# Patient Record
Sex: Female | Born: 1995
Health system: Southern US, Community
[De-identification: ages and names within clinical notes are randomized; demographics above are authoritative.]

## PROBLEM LIST (undated history)

## (undated) DIAGNOSIS — F419 Anxiety disorder, unspecified: Secondary | ICD-10-CM

## (undated) DIAGNOSIS — F32A Depression, unspecified: Secondary | ICD-10-CM

## (undated) DIAGNOSIS — E282 Polycystic ovarian syndrome: Secondary | ICD-10-CM

## (undated) DIAGNOSIS — K760 Fatty (change of) liver, not elsewhere classified: Secondary | ICD-10-CM

## (undated) DIAGNOSIS — G43909 Migraine, unspecified, not intractable, without status migrainosus: Secondary | ICD-10-CM

## (undated) DIAGNOSIS — J45909 Unspecified asthma, uncomplicated: Secondary | ICD-10-CM

## (undated) HISTORY — DX: Migraine, unspecified, not intractable, without status migrainosus: G43.909

## (undated) HISTORY — DX: Fatty (change of) liver, not elsewhere classified: K76.0

## (undated) HISTORY — DX: Unspecified asthma, uncomplicated: J45.909

## (undated) HISTORY — DX: Depression, unspecified: F32.A

## (undated) HISTORY — PX: WISDOM TOOTH EXTRACTION: SHX21

---
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
## 2013-08-20 ENCOUNTER — Ambulatory Visit: Payer: Self-pay | Attending: Internal Medicine

## 2013-09-15 ENCOUNTER — Ambulatory Visit: Payer: Self-pay | Admitting: Internal Medicine

## 2013-11-30 ENCOUNTER — Encounter: Payer: Self-pay | Admitting: Medical

## 2013-11-30 ENCOUNTER — Ambulatory Visit (INDEPENDENT_AMBULATORY_CARE_PROVIDER_SITE_OTHER): Payer: Medicaid Other | Admitting: Medical

## 2013-11-30 VITALS — BP 138/77 | HR 82 | Ht 65.0 in | Wt 295.9 lb

## 2013-11-30 DIAGNOSIS — N926 Irregular menstruation, unspecified: Secondary | ICD-10-CM

## 2013-11-30 DIAGNOSIS — Z832 Family history of diseases of the blood and blood-forming organs and certain disorders involving the immune mechanism: Secondary | ICD-10-CM

## 2013-11-30 NOTE — Progress Notes (Signed)
Patient reports abnormal periods. She had her last period on 2/26 and bled for 2-3 days. Prior to that her last period was in August.  She states that she can't take birth control because her mother told her that her family has an increased risk of clots.

## 2013-11-30 NOTE — Patient Instructions (Signed)

## 2013-11-30 NOTE — Progress Notes (Signed)
Patient ID: Chloe Pena, female   DOB: 1996/05/24, 18 y.o.   MRN: [id number]  Chief Complaint  Patient presents with  . Amenorrhea  . Dysmenorrhea    HPI Chloe Pena is a 18 y.o.  [id number]  female presenting for evaluation of irregular menses. Pt states that since menarche at the age of 18, she has been experiencing irregular menstrual cycles. These cycles are infrequent in their timings, sometimes absent for as much as 3 months at a time, and sometimes lasting up until 7-8 days at a time. The amount of bleeding ranges from general spotting to heavier bleeding. In addition to the menstrual irregularities, the pt has had some cramping which occurs monthly, and is present with or without bleeding. She has never been evaluated for this in the past. Pt denies any known medical conditions outside of asthma, however does not see a PCP with any regularity.  Pt denies any sexual history or STI history. Pt has a family history significant for hypothyroidism (brother) and diabetes (maternal mother/father). The patient's mother reports a history of 4 DVTs and concern for other female family members with 'clotting' issues. For this reason the patient and her mother are adamantly opposed to OCPs.   Past Medical History  Diagnosis Date  . Asthma     as a child    History reviewed. No pertinent past surgical history.  Family History  Problem Relation Age of Onset  . Migraines Mother   . GER disease Mother   . Heart disease Mother   . Cancer Maternal Grandmother   . Heart failure Mother   . Hypertension Maternal Grandmother   . Diabetes Maternal Grandmother   . Diabetes Father   . Thyroid disease Brother     Social History History  Substance Use Topics  . Smoking status: Never Smoker   . Smokeless tobacco: Never Used  . Alcohol Use: No    No Known Allergies  No current outpatient prescriptions on file.   No current facility-administered medications for this visit.    Review  of Systems Review of Systems Review of Systems - History obtained from the patient Cardiovascular ROS: no chest pain or dyspnea on exertion Gastrointestinal ROS: no abdominal pain, change in bowel habits, or black or bloody stools Genito-Urinary ROS: no dysuria, trouble voiding, or hematuria Musculoskeletal ROS: negative Reproductive: irregular bleeding and menstrual cramping  Blood pressure 138/77, pulse 82, height 5\' 5"  (1.651 m), weight 134.219 kg (295 lb 14.4 oz), last menstrual period 11/12/2013.  Physical Exam Physical Exam Physical Examination:  GENERAL ASSESSMENT: well developed and well nourished, obese, hirsutism noted HEENT: Normocephalic, atraumatic.   CHEST: normal air exchange, no rales, no rhonchi, no wheezes, respiratory effort normal with no retractions HEART: regular rate and rhythm, normal S1/S2, no murmurs SKIN: Warm, dry and without erythema PSYCH: Normal mood and affect  Assessment    Irregular menses    Plan    1. TSH, FSH, LH and testosterone drawn today 2. Patient referred to [place] for primary care 3. Patient will be contacted with any abnormal results 4. Patient to return to [facility] clinic PRN       Jeanett Schlein T 11/30/2013, 4:24 PM   I have seen and evaluated the patient with the PA student. I agree with the assessment and plan as written above.   Farris Has, PA-C 11/30/2013 4:50 PM

## 2013-12-01 LAB — LUTEINIZING HORMONE: LH: 9 m[IU]/mL

## 2013-12-01 LAB — FOLLICLE STIMULATING HORMONE: FSH: 4.4 m[IU]/mL

## 2013-12-01 LAB — TESTOSTERONE, FREE, TOTAL, SHBG
Sex Hormone Binding: 16 nmol/L — ABNORMAL LOW (ref 18–114)
Testosterone, Free: 18.4 pg/mL — ABNORMAL HIGH (ref 0.6–6.8)
Testosterone-% Free: 2.6 % — ABNORMAL HIGH (ref 0.4–2.4)
Testosterone: 71 ng/dL — ABNORMAL HIGH (ref 15–40)

## 2013-12-01 LAB — TSH: TSH: 1.611 u[IU]/mL (ref 0.350–4.500)

## 2013-12-04 ENCOUNTER — Telehealth: Payer: Self-pay

## 2013-12-04 NOTE — Telephone Encounter (Signed)
Called pt and her mother picked up the phone and stated that she was in class and does not get out until 1pm.  I advised her mother to please have her call the office on Monday before 4pm.  Pt's mother agreed.

## 2013-12-04 NOTE — Telephone Encounter (Signed)
Message copied by Michel Harrow on Fri Dec 04, 2013 11:15 AM ------      Message from: Farris Has      Created: Fri Dec 04, 2013  9:25 AM       Please call patient and inform her that labs indicate that her irregular periods are most likely because of PCOS. TSH was normal. We discussed PCOS at her last visit and how the only way to regulate periods would be OCPs. There is no treatment needed since she did not want to take OCPs but it is likely her irregular periods will continue. If the patient has further questions or concerns she can make an appointment to come back or I can call her to discuss just let me know. Thanks! Almyra Free ------

## 2013-12-07 NOTE — Telephone Encounter (Signed)
Called patient, no answer- left message that we are trying to reach you with some information, please call us back at the clinics

## 2013-12-08 NOTE — Telephone Encounter (Signed)
Pt called back and I spoke with her about her test results. I informed her of the normal TSH value and that her periods are most likely due to PCOS as previously discussed at her appt with Rozelle Logan, PA.  If she decides that she would like to try OCP's to regulaet her periods or if she has additional questions or concerns, she may schedule a clinic appt for follow up. Pt then asked if she could have a copy of her results mailed to her. I explained that we cannot do that without a signed Release of Information form. She is welcome to come to the clinic and sign the release so that we can provide a copy of her results.  Pt voiced understanding.

## 2014-01-26 ENCOUNTER — Ambulatory Visit: Payer: Self-pay | Admitting: Internal Medicine

## 2014-02-03 ENCOUNTER — Encounter: Payer: Self-pay | Admitting: Internal Medicine

## 2014-02-03 ENCOUNTER — Ambulatory Visit: Payer: Medicaid Other | Attending: Internal Medicine | Admitting: Internal Medicine

## 2014-02-03 VITALS — BP 118/72 | HR 74 | Temp 98.1°F | Resp 22 | Ht 65.0 in | Wt 295.8 lb

## 2014-02-03 DIAGNOSIS — Z0000 Encounter for general adult medical examination without abnormal findings: Secondary | ICD-10-CM | POA: Diagnosis not present

## 2014-02-03 DIAGNOSIS — Z833 Family history of diabetes mellitus: Secondary | ICD-10-CM | POA: Insufficient documentation

## 2014-02-03 DIAGNOSIS — R51 Headache: Secondary | ICD-10-CM | POA: Diagnosis not present

## 2014-02-03 DIAGNOSIS — R519 Headache, unspecified: Secondary | ICD-10-CM

## 2014-02-03 LAB — GLUCOSE, POCT (MANUAL RESULT ENTRY): POC Glucose: 102 mg/dl — AB (ref 70–99)

## 2014-02-03 LAB — POCT GLYCOSYLATED HEMOGLOBIN (HGB A1C): Hemoglobin A1C: 5

## 2014-02-03 NOTE — Progress Notes (Signed)
Patient ID: Chloe Pena, female   DOB: 23-Mar-1996, 18 y.o.   MRN: [id number]   [id number]:[id number]  [id number]  DOB - [birth year]  CC:  Chief Complaint  Patient presents with  . Establish Care  . Annual Exam       HPI: Chloe Pena is a 18 y.o. female here today to establish medical care.  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  Patient presents with her mother who feels that she can give a more accurate description of the presenting problem.  Patients mother reports a family history of migraine headaches which she sees a Garment/textile technologist for and would like her daughter to see the same one.  Patient reports that she is having headaches about five times per month that start at the crown of her head and radiate to the frontal lobe.  She reports it is a throbbing sensation.  Headaches are triggered by smells, stress, and being in the sun for a prolonged period of time.  She reports that laying down in a dark room makes the headaches better.  Patient reports minimal nausea and some photophobia. Denies aura.   No Known Allergies Past Medical History  Diagnosis Date  . Asthma     as a child   No current outpatient prescriptions on file prior to visit.   No current facility-administered medications on file prior to visit.   Family History  Problem Relation Age of Onset  . Migraines Mother   . GER disease Mother   . Heart disease Mother   . Cancer Maternal Grandmother   . Heart failure Mother   . Hypertension Maternal Grandmother   . Diabetes Maternal Grandmother   . Diabetes Father   . Thyroid disease Brother    History   Social History  . Marital Status: Single    Spouse Name: N/A    Number of Children: N/A  . Years of Education: N/A   Occupational History  . Not on file.   Social History Main Topics  . Smoking status: Never Smoker   . Smokeless tobacco: Never Used  . Alcohol Use: No  . Drug Use: No  . Sexual Activity: No   Other Topics Concern   . Not on file   Social History Narrative  . No narrative on file   Review of Systems  Constitutional: Negative for fever and chills.  Eyes: Positive for photophobia.  Respiratory: Negative.   Cardiovascular: Negative.   Gastrointestinal: Positive for nausea (rare). Negative for vomiting and abdominal pain.  Genitourinary: Negative.   Musculoskeletal: Negative.   Skin: Negative.   Neurological: Positive for headaches. Negative for dizziness, tingling, tremors, sensory change, speech change, focal weakness, seizures and loss of consciousness.  Endo/Heme/Allergies: Negative.   Psychiatric/Behavioral: Negative.       Objective:   Filed Vitals:   02/03/14 1546  BP: 118/72  Pulse: 74  Temp: 98.1 F (36.7 C)  Resp: 22    Physical Exam: Constitutional: Patient appears well-developed and well-nourished. No distress. HENT: Normocephalic, atraumatic, External right and left ear normal. Oropharynx is clear and moist.  Eyes: Conjunctivae and EOM are normal. PERRLA, no scleral icterus. Neck: Normal ROM. Neck supple. No JVD. No tracheal deviation. No thyromegaly. CVS: RRR, S1/S2 +, no murmurs, no gallops, no carotid bruit.  Pulmonary: Effort and breath sounds normal, no stridor, rhonchi, wheezes, rales.  Abdominal: Soft. BS +, no distension, tenderness, rebound or guarding.  Musculoskeletal: Normal range of motion. No edema and no tenderness.  Lymphadenopathy: No lymphadenopathy noted, cervical Neuro: Alert. Normal reflexes, muscle tone coordination. No cranial nerve deficit. Skin: Skin is warm and dry. No rash noted. Not diaphoretic. No erythema. No pallor. Psychiatric: Normal mood and affect. Behavior, judgment, thought content normal.  Lab Results  Component Value Date   WBC 7.8 03/09/2009   HGB 13.7 03/09/2009   HCT 41.2 03/09/2009   MCV 92.7 03/09/2009   PLT 215 03/09/2009   Lab Results  Component Value Date   CREATININE 0.79 03/09/2009   BUN 10 03/09/2009   NA 138  03/09/2009   K 4.1 03/09/2009   CL 107 03/09/2009   CO2 22 03/09/2009    Lab Results  Component Value Date   HGBA1C 5.0 02/03/2014   Lipid Panel  No results found for this basename: chol, trig, hdl, cholhdl, vldl, ldlcalc       Assessment and plan:   Chloe Pena was seen today for establish care and annual exam.  Diagnoses and associated orders for this visit:  Frequent headaches - Ambulatory referral to Neurology  Family history of diabetes mellitus - Glucose (CBG) - HgB A1c  Preventative health care - Ambulatory referral to Ophthalmology - Ambulatory referral to Dentistry - Lipid panel; Future - CBC; Future - COMPLETE METABOLIC PANEL WITH GFR; Future - TSH; Future - Vitamin D, 25-hydroxy; Future     Return if symptoms worsen or fail to improve.   Chari Manning, NP-C [facility] [phone] 02/03/2014, 9:59 PM

## 2014-02-03 NOTE — Progress Notes (Signed)
Patient here to establish care. Patient's mother present. States family history of DM and would like to be checked. States 6 year history of migraines.

## 2014-02-03 NOTE — Patient Instructions (Signed)

## 2014-02-05 ENCOUNTER — Ambulatory Visit (INDEPENDENT_AMBULATORY_CARE_PROVIDER_SITE_OTHER): Payer: Medicaid Other | Admitting: Neurology

## 2014-02-05 ENCOUNTER — Encounter: Payer: Self-pay | Admitting: Neurology

## 2014-02-05 VITALS — BP 120/78 | HR 68 | Resp 16 | Ht 65.0 in | Wt 297.0 lb

## 2014-02-05 DIAGNOSIS — R51 Headache: Secondary | ICD-10-CM

## 2014-02-05 DIAGNOSIS — R519 Headache, unspecified: Secondary | ICD-10-CM | POA: Insufficient documentation

## 2014-02-05 MED ORDER — TOPIRAMATE 25 MG PO TABS: ORAL_TABLET | ORAL | Status: DC

## 2014-02-05 MED ORDER — RIZATRIPTAN BENZOATE 10 MG PO TBDP: ORAL_TABLET | ORAL | Status: DC

## 2014-02-05 NOTE — Patient Instructions (Addendum)
1. MRI brain without contrast 2. Start Topamax 25mg  at bedtime 3. Take Maxalt 10mg  at onset of headache, may take second dose after 2 hours. Do not take more than 2 a week. 4. Keep headache diary, exercise and sleep hygiene is important

## 2014-02-05 NOTE — Progress Notes (Signed)
NEUROLOGY CONSULTATION NOTE  Chloe Pena MRN: [id number] DOB: 10-14-1995  Referring provider: Chari Manning, NP Primary care provider: Dr. Lorayne Marek  Reason for consult:  headaches  Thank you for your kind referral of [facility] for consultation of the above symptoms. Although her history is well known to you, please allow me to reiterate it for the purpose of our medical record. The patient was accompanied to the clinic by her mothe who also provides collateral information. Records and images were personally reviewed where available.  HISTORY OF PRESENT ILLNESS: This is a pleasant 18 year old left-handed woman with a history of obesity presenting for frequent headaches since age 75.  There is a strong family history of migraines, her mother is known to me for similar symptoms.  She reports that headaches usually start in the vertex then radiates diffusely, with a throbbing sensation and associated black spots in her vision, photo and phonophobia, and dizziness.  No nausea, vomiting, focal numbness/tingling/weakness.  Headaches can be mild where it is only "irritating" and does not hurt as bad, resolving if she eats or drinks, but can go up to a 10/10 where she needs to lie in a dark room with headache lasting up to 3 days.  She reports having several headaches this month, none so far this week.  She has tried Ibuprofen, Tylenol, and Excedrin with minimal effect.  Her mother gave her Maxalt which was effective. She and her mother report that their triggers are similar, including bright lights, chemical smells, heat, and stress in school.  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  She usually sleeps 6 hours at night and feels "like a zombie" in the morning, but denies daytime drowsiness once she is fully awake. Her mother reports loud snoring but no clear apneic episodes.    She denies any diplopia, dysarthria, dysphagia, focal numbness/tingling/weakness, bowel/bladder dysfunction.  She has some upper back pain.  There is a strong family history of migraines in her mother and "several generations" on her mother's side.  She does not know much of her father's medical history.    PAST MEDICAL HISTORY: Past Medical History  Diagnosis Date  . Asthma     as a child  . Migraine     PAST SURGICAL HISTORY: History reviewed. No pertinent past surgical history.  MEDICATIONS: No current outpatient prescriptions on file prior to visit.   No current facility-administered medications on file prior to visit.    ALLERGIES: No Known Allergies  FAMILY HISTORY: Family History  Problem Relation Age of Onset  . Migraines Mother   . GER disease Mother   . Heart disease Mother   . Cancer Maternal Grandmother   . Heart failure Mother   . Hypertension Maternal Grandmother   . Diabetes Maternal Grandmother   . Diabetes Father   . Thyroid disease Brother     SOCIAL HISTORY: History   Social History  . Marital Status: Single    Spouse Name: N/A    Number of Children: N/A  . Years of Education: N/A   Occupational History  . Not on file.   Social History Main Topics  . Smoking status: Never Smoker   . Smokeless tobacco: Never Used  . Alcohol Use: No  . Drug Use: No  . Sexual Activity: No   Other Topics Concern  . Not on file   Social History Narrative  . No narrative on file    REVIEW OF SYSTEMS: Constitutional: No fevers, chills, or sweats,  no generalized fatigue, change in appetite Eyes: No visual changes, double vision, eye pain Ear, nose and throat: No hearing loss, ear pain, nasal congestion, sore throat Cardiovascular: No chest pain, palpitations Respiratory:  No shortness of breath at rest or with exertion, wheezes GastrointestinaI: No nausea, vomiting, diarrhea, abdominal pain, fecal incontinence Genitourinary:  No dysuria, urinary retention or frequency Musculoskeletal:  + neck pain, back pain Integumentary: No rash, pruritus, skin  lesions Neurological: as above Psychiatric: No depression, insomnia, anxiety Endocrine: No palpitations, fatigue, diaphoresis, mood swings, change in appetite, change in weight, increased thirst Hematologic/Lymphatic:  No anemia, purpura, petechiae. Allergic/Immunologic: no itchy/runny eyes, nasal congestion, recent allergic reactions, rashes  PHYSICAL EXAM: Filed Vitals:   02/05/14 0819  BP: 120/78  Pulse: 68  Resp: 16   General: No acute distress, obese Head:  Normocephalic/atraumatic Eyes: Fundoscopic exam shows bilateral sharp discs, no vessel changes, exudates, or hemorrhages Neck: supple, no paraspinal tenderness, full range of motion Back: No paraspinal tenderness Heart: regular rate and rhythm Lungs: Clear to auscultation bilaterally. Vascular: No carotid bruits. Skin/Extremities: No rash, no edema Neurological Exam: Mental status: alert and oriented to person, place, and time, no dysarthria or aphasia, Fund of knowledge is appropriate.  Recent and remote memory are intact.  Attention and concentration are normal.    Able to name objects and repeat phrases. Cranial nerves: CN I: not tested CN II: pupils equal, round and reactive to light, visual fields intact, fundi unremarkable. CN III, IV, VI:  full range of motion, no nystagmus, no ptosis CN V: facial sensation intact CN VII: upper and lower face symmetric CN VIII: hearing intact to finger rub CN IX, X: gag intact, uvula midline CN XI: sternocleidomastoid and trapezius muscles intact CN XII: tongue midline Bulk & Tone: normal, no fasciculations. Motor: 5/5 throughout with no pronator drift. Sensation: intact to light touch, cold, pin, vibration and joint position sense.  No extinction to double simultaneous stimulation.  Romberg test negative Deep Tendon Reflexes: +2 throughout, no ankle clonus Plantar responses: downgoing bilaterally Cerebellar: no incoordination on finger to nose, heel to shin. No  dysdiadochokinesia Gait: narrow-based and steady, able to tandem walk adequately. Tremor: none  IMPRESSION: This is a 18 year old left-handed woman with a history of obesity presenting with frequent headaches.  Her exam is non-focal, no evidence of papilledema. Symptoms are suggestive of migraine headaches, however since she has not had any imaging in the past, an MRI brain without contrast will be ordered to rule out structural abnormality.  She will benefit from daily headache prophylactic medication, and they are interested in Topamax which her mother is also taking.  She will start low dose 25mg  qhs with plans for uptitration if needed. Side effects were discussed.  She will keep a headache calendar. She can use Maxalt as needed at onset of headache, and was instructed to take only 2-3 times a week to avoid rebound headaches.  We also discussed the importance of exercise and sleep hygiene with headaches.  She will follow-up in 4 months.  Thank you for allowing me to participate in the care of this patient. Please do not hesitate to call for any questions or concerns.   Ellouise Newer, M.D.

## 2014-02-09 ENCOUNTER — Telehealth: Payer: Self-pay | Admitting: Neurology

## 2014-02-09 NOTE — Telephone Encounter (Signed)
Ok to change, thanks 

## 2014-02-09 NOTE — Telephone Encounter (Signed)
Pharmacy notified.

## 2014-02-09 NOTE — Telephone Encounter (Signed)
Please call Cassie w/ [facility] # [phone]. Re: the  Pharmacy does not have oral disintegrating Maxalt tabs available but do have the regular in stock. Is it OK to change? / Sherri S.

## 2014-02-09 NOTE — Telephone Encounter (Signed)
Please advise 

## 2014-02-10 ENCOUNTER — Other Ambulatory Visit: Payer: Self-pay | Admitting: Internal Medicine

## 2014-02-10 ENCOUNTER — Ambulatory Visit: Payer: Medicaid Other | Attending: Internal Medicine

## 2014-02-10 ENCOUNTER — Telehealth: Payer: Self-pay

## 2014-02-10 DIAGNOSIS — R0683 Snoring: Secondary | ICD-10-CM

## 2014-02-10 DIAGNOSIS — Z0000 Encounter for general adult medical examination without abnormal findings: Secondary | ICD-10-CM

## 2014-02-10 LAB — CBC
HCT: 39.9 % (ref 36.0–46.0)
Hemoglobin: 13.7 g/dL (ref 12.0–15.0)
MCH: 30.4 pg (ref 26.0–34.0)
MCHC: 34.3 g/dL (ref 30.0–36.0)
MCV: 88.5 fL (ref 78.0–100.0)
Platelets: 266 10*3/uL (ref 150–400)
RBC: 4.51 MIL/uL (ref 3.87–5.11)
RDW: 13.3 % (ref 11.5–15.5)
WBC: 7.5 10*3/uL (ref 4.0–10.5)

## 2014-02-10 LAB — TSH: TSH: 1.951 u[IU]/mL (ref 0.350–4.500)

## 2014-02-10 LAB — LIPID PANEL
Cholesterol: 227 mg/dL — ABNORMAL HIGH (ref 0–200)
HDL: 33 mg/dL — ABNORMAL LOW (ref >39)
LDL Cholesterol: 155 mg/dL — ABNORMAL HIGH (ref 0–99)
Total CHOL/HDL Ratio: 6.9 Ratio
Triglycerides: 197 mg/dL — ABNORMAL HIGH (ref <150)
VLDL: 39 mg/dL (ref 0–40)

## 2014-02-10 LAB — COMPLETE METABOLIC PANEL WITH GFR
ALT: 67 U/L — ABNORMAL HIGH (ref 0–35)
AST: 35 U/L (ref 0–37)
Albumin: 4.5 g/dL (ref 3.5–5.2)
Alkaline Phosphatase: 96 U/L (ref 39–117)
BUN: 14 mg/dL (ref 6–23)
CO2: 26 mEq/L (ref 19–32)
Calcium: 9.4 mg/dL (ref 8.4–10.5)
Chloride: 105 mEq/L (ref 96–112)
Creat: 0.8 mg/dL (ref 0.50–1.10)
GFR, Est African American: >89 mL/min
GFR, Est Non African American: >89 mL/min
Glucose, Bld: 92 mg/dL (ref 70–99)
Potassium: 4.7 mEq/L (ref 3.5–5.3)
Sodium: 139 mEq/L (ref 135–145)
Total Bilirubin: 1.2 mg/dL — ABNORMAL HIGH (ref 0.2–1.1)
Total Protein: 6.8 g/dL (ref 6.0–8.3)

## 2014-02-10 NOTE — Telephone Encounter (Signed)
Patient is requesting a sleep study Is this something we can order for her?

## 2014-02-10 NOTE — Telephone Encounter (Signed)
Let patient know the order has been placed. Thanks

## 2014-02-11 ENCOUNTER — Telehealth: Payer: Self-pay

## 2014-02-11 LAB — VITAMIN D 25 HYDROXY (VIT D DEFICIENCY, FRACTURES): Vit D, 25-Hydroxy: 29 ng/mL — ABNORMAL LOW (ref 30–89)

## 2014-02-11 NOTE — Telephone Encounter (Signed)
Patient is aware the order for sleep study has been placed Telephone number for [facility] given to her as well

## 2014-02-12 ENCOUNTER — Telehealth: Payer: Self-pay | Admitting: *Deleted

## 2014-02-12 NOTE — Telephone Encounter (Signed)
Message copied by Joan Mayans on Fri Feb 12, 2014  3:00 PM ------      Message from: Chari Manning A      Created: Thu Feb 11, 2014  9:15 PM       Please counsel patient extensively about weight loss and exercise. Educate patient on low fat diet, avoid excess starches such as bread, pasta, potatoes to help lower cholesterol. She can eat more almonds and baked or broiled fish. Let her know to exercise at least 5 days a week and we will repeat lipid panel in 6 months, if no improvement she may need medication management. She may get low dose OTC vitamin D and take daily. ------

## 2014-02-12 NOTE — Telephone Encounter (Signed)
Left message

## 2014-02-18 ENCOUNTER — Ambulatory Visit (HOSPITAL_COMMUNITY): Admission: RE | Admit: 2014-02-18 | Payer: Medicaid Other | Source: Ambulatory Visit

## 2014-03-25 ENCOUNTER — Ambulatory Visit (HOSPITAL_BASED_OUTPATIENT_CLINIC_OR_DEPARTMENT_OTHER): Payer: Medicaid Other | Attending: Internal Medicine

## 2014-05-06 ENCOUNTER — Ambulatory Visit: Payer: Medicaid Other

## 2014-09-01 ENCOUNTER — Ambulatory Visit: Payer: Medicaid Other | Admitting: Neurology

## 2014-10-05 ENCOUNTER — Ambulatory Visit: Payer: Medicaid Other | Admitting: Neurology

## 2014-10-06 ENCOUNTER — Telehealth: Payer: Self-pay | Admitting: Neurology

## 2014-10-06 NOTE — Telephone Encounter (Signed)
Pt no showed 10/05/14 follow up appt w/ Dr. Delice Lesch. Pt verbally confirmed appt during reminder calls. No show letter w/ no show policy mailed to pt / Sherri S.

## 2017-12-08 ENCOUNTER — Inpatient Hospital Stay (HOSPITAL_COMMUNITY): Payer: BLUE CROSS/BLUE SHIELD

## 2017-12-08 ENCOUNTER — Inpatient Hospital Stay (HOSPITAL_COMMUNITY)
Admission: AD | Admit: 2017-12-08 | Discharge: 2017-12-09 | Disposition: A | Discharge status: Home / Self Care | Payer: BLUE CROSS/BLUE SHIELD | Source: Ambulatory Visit | Attending: Obstetrics and Gynecology | Admitting: Obstetrics and Gynecology

## 2017-12-08 ENCOUNTER — Encounter (HOSPITAL_COMMUNITY): Payer: Self-pay

## 2017-12-08 DIAGNOSIS — R109 Unspecified abdominal pain: Secondary | ICD-10-CM

## 2017-12-08 DIAGNOSIS — G8929 Other chronic pain: Secondary | ICD-10-CM | POA: Diagnosis not present

## 2017-12-08 DIAGNOSIS — N83201 Unspecified ovarian cyst, right side: Secondary | ICD-10-CM | POA: Diagnosis not present

## 2017-12-08 DIAGNOSIS — E282 Polycystic ovarian syndrome: Secondary | ICD-10-CM

## 2017-12-08 DIAGNOSIS — J45909 Unspecified asthma, uncomplicated: Secondary | ICD-10-CM | POA: Insufficient documentation

## 2017-12-08 DIAGNOSIS — R1030 Lower abdominal pain, unspecified: Secondary | ICD-10-CM

## 2017-12-08 DIAGNOSIS — Z6841 Body Mass Index (BMI) 40.0 and over, adult: Secondary | ICD-10-CM

## 2017-12-08 DIAGNOSIS — N83202 Unspecified ovarian cyst, left side: Secondary | ICD-10-CM | POA: Diagnosis not present

## 2017-12-08 DIAGNOSIS — G43909 Migraine, unspecified, not intractable, without status migrainosus: Secondary | ICD-10-CM | POA: Insufficient documentation

## 2017-12-08 DIAGNOSIS — R748 Abnormal levels of other serum enzymes: Secondary | ICD-10-CM | POA: Insufficient documentation

## 2017-12-08 DIAGNOSIS — M545 Low back pain: Secondary | ICD-10-CM | POA: Diagnosis not present

## 2017-12-08 LAB — COMPREHENSIVE METABOLIC PANEL
ALT: 170 U/L — ABNORMAL HIGH (ref 14–54)
AST: 87 U/L — ABNORMAL HIGH (ref 15–41)
Albumin: 4.3 g/dL (ref 3.5–5.0)
Alkaline Phosphatase: 121 U/L (ref 38–126)
Anion gap: 12 (ref 5–15)
BUN: 11 mg/dL (ref 6–20)
CO2: 24 mmol/L (ref 22–32)
Calcium: 9.4 mg/dL (ref 8.9–10.3)
Chloride: 99 mmol/L — ABNORMAL LOW (ref 101–111)
Creatinine, Ser: 0.7 mg/dL (ref 0.44–1.00)
GFR calc Af Amer: >60 mL/min (ref >60)
GFR calc non Af Amer: >60 mL/min (ref >60)
Glucose, Bld: 113 mg/dL — ABNORMAL HIGH (ref 65–99)
Potassium: 4.4 mmol/L (ref 3.5–5.1)
Sodium: 135 mmol/L (ref 135–145)
Total Bilirubin: 1.1 mg/dL (ref 0.3–1.2)
Total Protein: 8.1 g/dL (ref 6.5–8.1)

## 2017-12-08 LAB — CBC
HCT: 41.8 % (ref 36.0–46.0)
Hemoglobin: 14.1 g/dL (ref 12.0–15.0)
MCH: 31.2 pg (ref 26.0–34.0)
MCHC: 33.7 g/dL (ref 30.0–36.0)
MCV: 92.5 fL (ref 78.0–100.0)
Platelets: 277 10*3/uL (ref 150–400)
RBC: 4.52 MIL/uL (ref 3.87–5.11)
RDW: 12.9 % (ref 11.5–15.5)
WBC: 16.2 10*3/uL — ABNORMAL HIGH (ref 4.0–10.5)

## 2017-12-08 LAB — URINALYSIS, ROUTINE W REFLEX MICROSCOPIC
Bilirubin Urine: NEGATIVE
Glucose, UA: NEGATIVE mg/dL
Hgb urine dipstick: NEGATIVE
Ketones, ur: 5 mg/dL — AB
Nitrite: NEGATIVE
Protein, ur: NEGATIVE mg/dL
Specific Gravity, Urine: 1.027 (ref 1.005–1.030)
pH: 7 (ref 5.0–8.0)

## 2017-12-08 LAB — POCT PREGNANCY, URINE: Preg Test, Ur: NEGATIVE

## 2017-12-08 MED ORDER — IOPAMIDOL (ISOVUE-300) INJECTION 61%
100.0000 mL | Freq: Once | INTRAVENOUS | Status: AC | PRN
  Administered 2017-12-08: 100 mL via INTRAVENOUS

## 2017-12-08 MED ORDER — KETOROLAC TROMETHAMINE 60 MG/2ML IM SOLN
60.0000 mg | Freq: Once | INTRAMUSCULAR | Status: AC
Start: 2017-12-08 — End: 2017-12-08
  Administered 2017-12-08: 60 mg via INTRAMUSCULAR
  Filled 2017-12-08: qty 2

## 2017-12-08 NOTE — MAU Provider Note (Addendum)
History     CSN: [id number]  Arrival date and time: 12/08/17 1628   First Provider Initiated Contact with Patient 12/08/17 1705      Chief Complaint  Patient presents with  . Abdominal Pain  . Back Pain   HPI Chloe Pena 22 y.o. Comes to [facility]U with low back pain, abdominal pain and vomiting today.  [facility] best [facility] to come.  She took one Tylenol tablet earlier today but it did not help her pain.  Reports she has never had sex.  Previously she has been diagnosed with PCOS in 2015.  She reports no vaginal bleeding in 2 years.   OB History    Gravida  0   Para  0   Term  0   Preterm  0   AB  0   Living  0     SAB  0   TAB  0   Ectopic  0   Multiple  0   Live Births              Past Medical History:  Diagnosis Date  . Asthma    as a child  . Migraine     History reviewed. No pertinent surgical history.  Family History  Problem Relation Age of Onset  . Migraines Mother   . GER disease Mother   . Heart disease Mother   . Heart failure Mother   . Cancer Maternal Grandmother   . Hypertension Maternal Grandmother   . Diabetes Maternal Grandmother   . Diabetes Father   . Thyroid disease Brother     Social History   Tobacco Use  . Smoking status: Never Smoker  . Smokeless tobacco: Never Used  Substance Use Topics  . Alcohol use: No  . Drug use: No   Patient Active Problem List   Diagnosis Date Noted  . Morbid obesity ([place]) 12/08/2017  . PCOS (polycystic ovarian syndrome) 12/08/2017  . Headache(784.0) 02/05/2014    Allergies: No Known Allergies  Medications Prior to Admission  Medication Sig Dispense Refill Last Dose  . rizatriptan (MAXALT-MLT) 10 MG disintegrating tablet Take at onset of headache, may take second dose after 2 hours. Do not take more than 2 a week. 10 tablet 11   . topiramate (TOPAMAX) 25 MG tablet Take 1 tablet at bedtime 30 tablet 6     Review of Systems  Constitutional:  Negative for fever.  Gastrointestinal: Positive for abdominal pain, nausea and vomiting. Negative for constipation and diarrhea.  Genitourinary: Negative for dysuria, vaginal bleeding and vaginal discharge.  Musculoskeletal: Positive for back pain.   Physical Exam   Blood pressure (!) 187/84, pulse 88, temperature 98.7 F (37.1 C), temperature source Oral, resp. rate 18, height 5\' 6"  (1.676 m), weight (!) 338 lb (153.3 kg).  Physical Exam  Nursing note and vitals reviewed. Constitutional: She is oriented to person, place, and time. She appears well-developed and well-nourished.  Morbid obesity  HENT:  Head: Normocephalic.  Eyes: EOM are normal.  Neck: Neck supple.  GI: Soft. There is tenderness. There is no rebound and no guarding.  Generalized tenderness all over her abdomen  Musculoskeletal: Normal range of motion.  Neurological: She is alert and oriented to person, place, and time.  Skin: Skin is warm and dry.  Psychiatric: She has a normal mood and affect.    MAU Course  Procedures Results for orders placed or performed during the hospital encounter of 12/08/17 (from  the past 24 hour(s))  Urinalysis, Routine w reflex microscopic     Status: Abnormal   Collection Time: 12/08/17  4:34 PM  Result Value Ref Range   Color, Urine YELLOW YELLOW   APPearance CLOUDY (A) CLEAR   Specific Gravity, Urine 1.027 1.005 - 1.030   pH 7.0 5.0 - 8.0   Glucose, UA NEGATIVE NEGATIVE mg/dL   Hgb urine dipstick NEGATIVE NEGATIVE   Bilirubin Urine NEGATIVE NEGATIVE   Ketones, ur 5 (A) NEGATIVE mg/dL   Protein, ur NEGATIVE NEGATIVE mg/dL   Nitrite NEGATIVE NEGATIVE   Leukocytes, UA SMALL (A) NEGATIVE   RBC / HPF 6-30 0 - 5 RBC/hpf   WBC, UA 6-30 0 - 5 WBC/hpf   Bacteria, UA RARE (A) NONE SEEN   Squamous Epithelial / LPF 0-5 (A) NONE SEEN   Mucus PRESENT   Pregnancy, urine POC     Status: None   Collection Time: 12/08/17  5:17 PM  Result Value Ref Range   Preg Test, Ur NEGATIVE NEGATIVE   CBC     Status: Abnormal   Collection Time: 12/08/17  5:41 PM  Result Value Ref Range   WBC 16.2 (H) 4.0 - 10.5 K/uL   RBC 4.52 3.87 - 5.11 MIL/uL   Hemoglobin 14.1 12.0 - 15.0 g/dL   HCT 41.8 36.0 - 46.0 %   MCV 92.5 78.0 - 100.0 fL   MCH 31.2 26.0 - 34.0 pg   MCHC 33.7 30.0 - 36.0 g/dL   RDW 12.9 11.5 - 15.5 %   Platelets 277 150 - 400 K/uL  Comprehensive metabolic panel     Status: Abnormal   Collection Time: 12/08/17  5:41 PM  Result Value Ref Range   Sodium 135 135 - 145 mmol/L   Potassium 4.4 3.5 - 5.1 mmol/L   Chloride 99 (L) 101 - 111 mmol/L   CO2 24 22 - 32 mmol/L   Glucose, Bld 113 (H) 65 - 99 mg/dL   BUN 11 6 - 20 mg/dL   Creatinine, Ser 0.70 0.44 - 1.00 mg/dL   Calcium 9.4 8.9 - 10.3 mg/dL   Total Protein 8.1 6.5 - 8.1 g/dL   Albumin 4.3 3.5 - 5.0 g/dL   AST 87 (H) 15 - 41 U/L   ALT 170 (H) 14 - 54 U/L   Alkaline Phosphatase 121 38 - 126 U/L   Total Bilirubin 1.1 0.3 - 1.2 mg/dL   GFR calc non Af Amer >60 >60 mL/min   GFR calc Af Amer >60 >60 mL/min   Anion gap 12 5 - 15    MDM Toradol 60 mg IM given in MAU Dr. Rosana Hoes reviewed labs and went in to see client and did exam.  Will get pelvic ultrasound only.  Transvaginal not done as client has never had intercourse and has never had a pelvic exam. Sleeping in MAU after her ultrasound.  BP in normal range now 132/68.   Had been talking and moving her arm when the BP was taken previously.  CLINICAL DATA:  Abdominal pain.  Evaluate for ovarian torsion.  EXAM: TRANSABDOMINAL ULTRASOUND OF PELVIS  DOPPLER ULTRASOUND OF OVARIES  TECHNIQUE: Transabdominal ultrasound examination of the pelvis was performed including evaluation of the uterus, ovaries, adnexal regions, and pelvic cul-de-sac.  Color and duplex Doppler ultrasound was utilized to evaluate blood flow to the ovaries.  COMPARISON:  None.  FINDINGS: Uterus  Measurements: 7.7 x 4.1 x 3.0 cm. No fibroids or other  mass visualized.  Endometrium  Thickness: 8 mm.  No focal abnormality visualized.  Right ovary  Measurements: Not visualized. 13.8 x 8.5 x 14 cm large cystic mass is identified in the right adnexal space. This appears to be adjacent to but cannot be definitely seen to arise from the right ovary. There appears to be ovarian parenchyma immediately adjacent to the cyst.  Left ovary  Measurements: Not visualized.  Pulsed Doppler evaluation demonstrates normal low-resistance arterial and venous waveforms were documented in the apparent ovarian parenchyma adjacent to the large right adnexal cyst. Left ovary could not be visualized.  Trace free fluid in the cul-de-sac.  IMPRESSION: Limited study secondary to body habitus and inability to perform endovaginal scanning. There is a large cystic lesion in the right adnexal space measuring up to 13.8 cm. Apparent adjacent ovarian parenchyma may be the right ovary, but the large cyst cannot be confirmed to arise from the ovarian parenchyma. The sonographer does document low resistance arterial and venous waveforms within this apparent ovarian parenchyma.  Nonvisualization left ovary.  Given the size of this lesion, immediate further characterization by CT with oral and intravenous contrast material may prove helpful. Ultimately, MRI of the pelvis without and with contrast may be Warranted.  Discussed results of ultrasound with Dr. Rosana Hoes and she will order CT scan for more information. Care assumed by V. Stann Mainland, CNM at 2200  US Pelvis (transabdominal Only)  Result Date: 12/08/2017 CLINICAL DATA:  Abdominal pain.  Evaluate for ovarian torsion. EXAM: TRANSABDOMINAL ULTRASOUND OF PELVIS DOPPLER ULTRASOUND OF OVARIES TECHNIQUE: Transabdominal ultrasound examination of the pelvis was performed including evaluation of the uterus, ovaries, adnexal regions, and pelvic cul-de-sac. Color and duplex Doppler ultrasound was utilized to  evaluate blood flow to the ovaries. COMPARISON:  None. FINDINGS: Uterus Measurements: 7.7 x 4.1 x 3.0 cm. No fibroids or other mass visualized. Endometrium Thickness: 8 mm.  No focal abnormality visualized. Right ovary Measurements: Not visualized. 13.8 x 8.5 x 14 cm large cystic mass is identified in the right adnexal space. This appears to be adjacent to but cannot be definitely seen to arise from the right ovary. There appears to be ovarian parenchyma immediately adjacent to the cyst. Left ovary Measurements: Not visualized. Pulsed Doppler evaluation demonstrates normal low-resistance arterial and venous waveforms were documented in the apparent ovarian parenchyma adjacent to the large right adnexal cyst. Left ovary could not be visualized. Trace free fluid in the cul-de-sac. IMPRESSION: Limited study secondary to body habitus and inability to perform endovaginal scanning. There is a large cystic lesion in the right adnexal space measuring up to 13.8 cm. Apparent adjacent ovarian parenchyma may be the right ovary, but the large cyst cannot be confirmed to arise from the ovarian parenchyma. The sonographer does document low resistance arterial and venous waveforms within this apparent ovarian parenchyma. Nonvisualization left ovary. Given the size of this lesion, immediate further characterization by CT with oral and intravenous contrast material may prove helpful. Ultimately, MRI of the pelvis without and with contrast may be warranted. Electronically Signed   By: Misty Stanley M.D.   On: 12/08/2017 20:32   Ct Abdomen Pelvis W Contrast  Result Date: 12/08/2017 CLINICAL DATA:  Right lower quadrant pain EXAM: CT ABDOMEN AND PELVIS WITH CONTRAST TECHNIQUE: Multidetector CT imaging of the abdomen and pelvis was performed using the standard protocol following bolus administration of intravenous contrast. CONTRAST:  151mL ISOVUE-300 IOPAMIDOL (ISOVUE-300) INJECTION 61% COMPARISON:  None. FINDINGS: Lower chest: No  basilar pulmonary nodules or pleural effusion. No apical pericardial effusion.  Hepatobiliary: Normal hepatic contours and density. No intra- or extrahepatic biliary dilatation. Normal gallbladder. Pancreas: Normal parenchymal contours without ductal dilatation. No peripancreatic fluid collection. Spleen: Normal. Adrenals/Urinary Tract: --Adrenal glands: Normal. --Right kidney/ureter: No hydronephrosis, nephroureterolithiasis, perinephric stranding or solid renal mass. --Left kidney/ureter: No hydronephrosis, nephroureterolithiasis, perinephric stranding or solid renal mass. --Urinary bladder: Normal for degree of distention Stomach/Bowel: --Stomach/Duodenum: No hiatal hernia or other gastric abnormality. Normal duodenal course. --Small bowel: No dilatation or inflammation. --Colon: No focal abnormality. --Appendix: Normal. Vascular/Lymphatic: Normal course and caliber of the major abdominal vessels. No abdominal or pelvic lymphadenopathy. Reproductive: Normal appearance of the uterus. The right ovary is normal. The left ovary is deviated toward the midline and is enlarged, measuring 6.4 x 4.9 cm. Continuous with the ovary is a massive cystic structure that measures 12.4 x 7.9 cm. Musculoskeletal. No bony spinal canal stenosis or focal osseous abnormality. Other: None. IMPRESSION: 1. Enlarged left ovary with associated massive cystic structure measuring up to 12.4 cm. MRI of the pelvis with and without contrast is recommended for more complete characterization of internal architecture and to evaluate for ovarian neoplasm. 2. Normal appendix.  No acute abdominal abnormality. Electronically Signed   By: Ulyses Jarred M.D.   On: 12/08/2017 23:41   US Pelvic Doppler (torsion R/o Or Mass Arterial Flow)  Result Date: 12/08/2017 CLINICAL DATA:  Abdominal pain.  Evaluate for ovarian torsion. EXAM: TRANSABDOMINAL ULTRASOUND OF PELVIS DOPPLER ULTRASOUND OF OVARIES TECHNIQUE: Transabdominal ultrasound examination of the  pelvis was performed including evaluation of the uterus, ovaries, adnexal regions, and pelvic cul-de-sac. Color and duplex Doppler ultrasound was utilized to evaluate blood flow to the ovaries. COMPARISON:  None. FINDINGS: Uterus Measurements: 7.7 x 4.1 x 3.0 cm. No fibroids or other mass visualized. Endometrium Thickness: 8 mm.  No focal abnormality visualized. Right ovary Measurements: Not visualized. 13.8 x 8.5 x 14 cm large cystic mass is identified in the right adnexal space. This appears to be adjacent to but cannot be definitely seen to arise from the right ovary. There appears to be ovarian parenchyma immediately adjacent to the cyst. Left ovary Measurements: Not visualized. Pulsed Doppler evaluation demonstrates normal low-resistance arterial and venous waveforms were documented in the apparent ovarian parenchyma adjacent to the large right adnexal cyst. Left ovary could not be visualized. Trace free fluid in the cul-de-sac. IMPRESSION: Limited study secondary to body habitus and inability to perform endovaginal scanning. There is a large cystic lesion in the right adnexal space measuring up to 13.8 cm. Apparent adjacent ovarian parenchyma may be the right ovary, but the large cyst cannot be confirmed to arise from the ovarian parenchyma. The sonographer does document low resistance arterial and venous waveforms within this apparent ovarian parenchyma. Nonvisualization left ovary. Given the size of this lesion, immediate further characterization by CT with oral and intravenous contrast material may prove helpful. Ultimately, MRI of the pelvis without and with contrast may be warranted. Electronically Signed   By: Misty Stanley M.D.   On: 12/08/2017 20:32  Assessment and Plan  Elevated BP Elevated liver enzymes - possibly due to fatty liver with obesity Elevated WBC  PCOS - no menses in 3 years Ovarian cyst  Virginia Rochester 12/08/2017, 5:24 PM   C/w Dr Rosana Hoes with results of CT scan. Okay to  discharge home after Hepatitis panel collected. Patient to follow up in office for GYN care and management of ovarian cyst   Pt verbalizes understanding. Will call to schedule appointment. Pt to follow up with  PCP for BP management.   Reasons to return to MAU with worsening symptoms discussed by Dr Rosana Hoes   Pt stable prior to discharge home.   Lajean Manes, CNM 12/09/17, 1:07 AM

## 2017-12-08 NOTE — MAU Note (Addendum)
Woke up this AM with pelvic pain that radiates to lower back and up her back. Some nausea and vomitting this AM. No vaginal bleeding or discharge. No period in 2 years. Reports she has been feeling cold but did not check temp at home.

## 2017-12-09 LAB — URINE CULTURE

## 2017-12-09 NOTE — Progress Notes (Signed)
Faculty Note  Patient reports improvement in pain, sitting comfortably in bed with no distress.   Patient with elevated BP that has since improved, mildly elevated AST/ALT and white count, diffuse mild abdominal pain but no other symptoms.   Patient with no acute process on CT however 13 cm right ovarian cyst noted.   With no acute process, will dc patient home. Have sent acute hepatitis panel for mildly elevated LFTs. Reviewed importance of finding primary care doctor for BP management. She will follow up in [facility] for ovarian cyst, reviewed low likelihood of malignancy given her age, however with size of ovarian cyst, it will likely need to be removed, or at a minimum, further characterized and followed. She and mother verbalize understanding and will follow up in office.   Dc home Return with worsening symptoms Encouraged stool softeners and increased fiber intake for constipation   K. Arvilla Meres, M.D. Attending [employer], [facility] for [employer], [place]

## 2017-12-10 LAB — HEPATITIS PANEL, ACUTE
HCV Ab: <0.1 s/co ratio (ref 0.0–0.9)
Hep A IgM: NEGATIVE
Hep B C IgM: NEGATIVE
Hepatitis B Surface Ag: NEGATIVE

## 2017-12-23 ENCOUNTER — Encounter: Payer: Self-pay | Admitting: *Deleted

## 2017-12-23 ENCOUNTER — Encounter: Payer: Self-pay | Admitting: Obstetrics and Gynecology

## 2017-12-23 ENCOUNTER — Ambulatory Visit (INDEPENDENT_AMBULATORY_CARE_PROVIDER_SITE_OTHER): Payer: BLUE CROSS/BLUE SHIELD | Admitting: Obstetrics and Gynecology

## 2017-12-23 VITALS — Wt 333.0 lb

## 2017-12-23 DIAGNOSIS — N83202 Unspecified ovarian cyst, left side: Secondary | ICD-10-CM

## 2017-12-23 DIAGNOSIS — Z3202 Encounter for pregnancy test, result negative: Secondary | ICD-10-CM

## 2017-12-23 DIAGNOSIS — Z3046 Encounter for surveillance of implantable subdermal contraceptive: Secondary | ICD-10-CM

## 2017-12-23 DIAGNOSIS — Z30017 Encounter for initial prescription of implantable subdermal contraceptive: Secondary | ICD-10-CM

## 2017-12-23 LAB — POCT URINE PREGNANCY: PREG TEST UR: NEGATIVE

## 2017-12-23 MED ORDER — ETONOGESTREL 68 MG ~~LOC~~ IMPL
68.0000 mg | DRUG_IMPLANT | Freq: Once | SUBCUTANEOUS | Status: AC
  Administered 2017-12-23: 68 mg via SUBCUTANEOUS

## 2017-12-23 NOTE — Progress Notes (Signed)
NGYN patient presents for problem visit . Pt has not had a cycle in x 2 yrs   Recent ER visit pt had U/S was told she had a cyst.  Pt has never had a pap. Pt is still a virgin  Wants to discuss contraception to help start and regulate period.

## 2017-12-23 NOTE — Progress Notes (Signed)
22 yo G0 here for the evaluation of a right ovarian cyst and amenorrhea. Patient reports menarche at 75. She initially had a monthly period but her periods became irregular, often skipping months. She reports amenorrhea for the past 2 years. She has never been sexually active. She reports being diagnosed with PCOS and was offered contraception for cycle control but initially declined. She is now interested in progesterone only contraception. Patient reports persistent pelvic pain which is unchanged since her recent ED visit. Patient states she is used to this pain by now and would prefer delaying surgical intervention until after her semester.  Past Medical History:  Diagnosis Date  . Asthma    as a child  . Migraine    History reviewed. No pertinent surgical history. Family History  Problem Relation Age of Onset  . Migraines Mother   . GER disease Mother   . Heart disease Mother   . Heart failure Mother   . Cancer Maternal Grandmother   . Hypertension Maternal Grandmother   . Diabetes Maternal Grandmother   . Diabetes Father   . Thyroid disease Brother    Social History   Tobacco Use  . Smoking status: Never Smoker  . Smokeless tobacco: Never Used  Substance Use Topics  . Alcohol use: No  . Drug use: No   ROS See pertinent in HPI  GENERAL: Well-developed, well-nourished female in no acute distress.  HEENT: Normocephalic, atraumatic. Sclerae anicteric.  NECK: Supple. Normal thyroid.  LUNGS: Clear to auscultation bilaterally.  HEART: Regular rate and rhythm. BREASTS: Symmetric in size. No palpable masses or lymphadenopathy, skin changes, or nipple drainage. ABDOMEN: Soft, nontender, nondistended. No organomegaly. PELVIC: Normal external female genitalia. Vagina is pink and rugated.  Normal discharge. Normal appearing cervix. Uterus is normal in size.  No adnexal mass or tenderness. EXTREMITIES: No cyanosis, clubbing, or edema, 2+ distal pulses.  11/2017 CT    IMPRESSION: 1. Enlarged left ovary with associated massive cystic structure measuring up to 12.4 cm. MRI of the pelvis with and without contrast is recommended for more complete characterization of internal architecture and to evaluate for ovarian neoplasm. 2. Normal appendix.  No acute abdominal abnormality.   Electronically Signed   By: Ulyses Jarred M.D.   On: 12/08/2017 23:41  A/P 22 yo with left ovarian cyst and secondary amenorrhea - CA-125 ordered - Discussed surgical management likely involving oophorectomy performed laparoscopically. Risks, benefits and alternatives were reviewed including but not limited to risks of bleeding, infection and damage to adjacent organs. Patient verbalized understanding and all questions were answered. Surgery will be scheduled in May per patient request - Reviewed available progesterone only options. Patient opted for Nexplanon Patient given informed consent, signed copy in the chart, time out was performed. Pregnancy test was negative. Appropriate time out taken.  Patient's right arm was prepped and draped in the usual sterile fashion. The ruler used to measure and mark insertion area.  Patient was prepped with alcohol swab and then injected with 2 cc of 1% lidocaine with epinephrine.  Patient was prepped with betadine, Nexplanon removed form packaging.  Device confirmed in needle, then inserted full length of needle and withdrawn per handbook instructions.  Patient insertion site covered with a band-aid and Coban.   Minimal blood loss.  Patient tolerated the procedure well.  - Weight loss management discussed to help in the restoration of normal cycle

## 2017-12-23 NOTE — Patient Instructions (Signed)
Diet for Polycystic Ovarian Syndrome Polycystic ovary syndrome (PCOS) is a disorder of the chemical messengers (hormones) that regulate menstruation. The condition causes important hormones to be out of balance. PCOS can:  Make your periods irregular or stop.  Cause cysts to develop on the ovaries.  Make it difficult to get pregnant.  Stop your body from responding to the effects of insulin (insulin resistance), which can lead to obesity and diabetes.  Changing what you eat can help manage PCOS and improve your health. It can help you lose weight and improve the way your body uses insulin. What is my plan?  Eat breakfast, lunch, and dinner plus two snacks every day.  Include protein in each meal and snack.  Choose whole grains instead of products made with refined flour.  Eat a variety of foods.  Exercise regularly as told by your health care provider. What do I need to know about this eating plan? If you are overweight or obese, pay attention to how many calories you eat. Cutting down on calories can help you lose weight. Work with your health care provider or dietitian to figure out how many calories you need each day. What foods can I eat? Grains Whole grains, such as whole wheat. Whole-grain breads, crackers, cereals, and pasta. Unsweetened oatmeal, bulgur, barley, quinoa, or brown rice. Corn or whole-wheat flour tortillas. Vegetables  Lettuce. Spinach. Peas. Beets. Cauliflower. Cabbage. Broccoli. Carrots. Tomatoes. Squash. Eggplant. Herbs. Peppers. Onions. Cucumbers. Brussels sprouts. Fruits Berries. Bananas. Apples. Oranges. Grapes. Papaya. Mango. Pomegranate. Kiwi. Grapefruit. Cherries. Meats and Other Protein Sources Lean proteins, such as fish, chicken, beans, eggs, and tofu. Dairy Low-fat dairy products, such as skim milk, cheese sticks, and yogurt. Beverages Low-fat or fat-free drinks, such as water, low-fat milk, sugar-free drinks, and 100% fruit  juice. Condiments Ketchup. Mustard. Barbecue sauce. Relish. Low-fat or fat-free mayonnaise. Fats and Oils Olive oil or canola oil. Walnuts and almonds. The items listed above may not be a complete list of recommended foods or beverages. Contact your dietitian for more options. What foods are not recommended? Foods high in calories or fat. Fried foods. Sweets. Products made from refined white flour, including white bread, pastries, white rice, and pasta. The items listed above may not be a complete list of foods and beverages to avoid. Contact your dietitian for more information. This information is not intended to replace advice given to you by your health care provider. Make sure you discuss any questions you have with your health care provider. Document Released: 12/26/2015 Document Revised: 02/09/2016 Document Reviewed: 09/15/2014 Elsevier Interactive Patient Education  2018 Else[employer] Inc[employer]  Polycystic Ovarian Syndrome Polycystic ovarian syndrome (PCOS) is a common hormonal disorder among women of reproductive age. In most women with PCOS, many small fluid-filled sacs (cysts) grow on the ovaries, and the cysts are not part of a normal menstrual cycle. PCOS can cause problems with your menstrual periods and make it difficult to get pregnant. It can also cause an increased risk of miscarriage with pregnancy. If it is not treated, PCOS can lead to serious health problems, such as diabetes and heart disease. What are the causes? The cause of PCOS is not known, but it may be the result of a combination of certain factors, such as:  Irregular menstrual cycle.  High levels of certain hormones (androgens).  Problems with the hormone that helps to control blood sugar (insulin resistance).  Certain genes.  What increases the risk? This condition is more likely to develop in women who   have a family history of PCOS. What are the signs or symptoms? Symptoms of PCOS may include:  Multiple ovarian  cysts.  Infrequent periods or no periods.  Periods that are too frequent or too heavy.  Unpredictable periods.  Inability to get pregnant (infertility) because of not ovulating.  Increased growth of hair on the face, chest, stomach, back, thumbs, thighs, or toes.  Acne or oily skin. Acne may develop during adulthood, and it may not respond to treatment.  Pelvic pain.  Weight gain or obesity.  Patches of thickened and dark brown or black skin on the neck, arms, breasts, or thighs (acanthosis nigricans).  Excess hair growth on the face, chest, abdomen, or upper thighs (hirsutism).  How is this diagnosed? This condition is diagnosed based on:  Your medical history.  A physical exam, including a pelvic exam. Your health care provider may look for areas of increased hair growth on your skin.  Tests, such as: ? Ultrasound. This may be used to examine the ovaries and the lining of the uterus (endometrium) for cysts. ? Blood tests. These may be used to check levels of sugar (glucose), female hormone (testosterone), and female hormones (estrogen and progesterone) in your blood.  How is this treated? There is no cure for PCOS, but treatment can help to manage symptoms and prevent more health problems from developing. Treatment varies depending on:  Your symptoms.  Whether you want to have a baby or whether you need birth control (contraception).  Treatment may include nutrition and lifestyle changes along with:  Progesterone hormone to start a menstrual period.  Birth control pills to help you have regular menstrual periods.  Medicines to make you ovulate, if you want to get pregnant.  Medicine to reduce excessive hair growth.  Surgery, in severe cases. This may involve making small holes in one or both of your ovaries. This decreases the amount of testosterone that your body produces.  Follow these instructions at home:  Take over-the-counter and prescription medicines only  as told by your health care provider.  Follow a healthy meal plan. This can help you reduce the effects of PCOS. ? Eat a healthy diet that includes lean proteins, complex carbohydrates, fresh fruits and vegetables, low-fat dairy products, and healthy fats. Make sure to eat enough fiber.  If you are overweight, lose weight as told by your health care provider. ? Losing 10% of your body weight may improve symptoms. ? Your health care provider can determine how much weight loss is best for you and can help you lose weight safely.  Keep all follow-up visits as told by your health care provider. This is important. Contact a health care provider if:  Your symptoms do not get better with medicine.  You develop new symptoms. This information is not intended to replace advice given to you by your health care provider. Make sure you discuss any questions you have with your health care provider. Document Released: 12/28/2004 Document Revised: 05/01/2016 Document Reviewed: 02/19/2016 Elsevier Interactive Patient Education  2018 Elsevier Inc.  

## 2017-12-30 ENCOUNTER — Encounter (HOSPITAL_COMMUNITY): Payer: Self-pay

## 2017-12-30 ENCOUNTER — Encounter: Payer: Self-pay | Admitting: Family Medicine

## 2017-12-30 ENCOUNTER — Other Ambulatory Visit: Payer: Self-pay | Admitting: Obstetrics and Gynecology

## 2017-12-30 ENCOUNTER — Ambulatory Visit (INDEPENDENT_AMBULATORY_CARE_PROVIDER_SITE_OTHER): Payer: Self-pay | Admitting: Family Medicine

## 2017-12-30 DIAGNOSIS — Z131 Encounter for screening for diabetes mellitus: Secondary | ICD-10-CM

## 2017-12-30 DIAGNOSIS — E282 Polycystic ovarian syndrome: Secondary | ICD-10-CM

## 2017-12-30 DIAGNOSIS — R82998 Other abnormal findings in urine: Secondary | ICD-10-CM

## 2017-12-30 DIAGNOSIS — H6122 Impacted cerumen, left ear: Secondary | ICD-10-CM

## 2017-12-30 DIAGNOSIS — F411 Generalized anxiety disorder: Secondary | ICD-10-CM

## 2017-12-30 DIAGNOSIS — R748 Abnormal levels of other serum enzymes: Secondary | ICD-10-CM

## 2017-12-30 DIAGNOSIS — N83201 Unspecified ovarian cyst, right side: Secondary | ICD-10-CM

## 2017-12-30 LAB — POCT URINALYSIS DIPSTICK
Bilirubin, UA: NEGATIVE
Glucose, UA: NEGATIVE
Ketones, UA: NEGATIVE
NITRITE UA: NEGATIVE
PH UA: 6 (ref 5.0–8.0)
PROTEIN UA: NEGATIVE
RBC UA: NEGATIVE
Spec Grav, UA: 1.02 (ref 1.010–1.025)
Urobilinogen, UA: 0.2 E.U./dL

## 2017-12-30 LAB — POCT GLYCOSYLATED HEMOGLOBIN (HGB A1C): Hemoglobin A1C: 5.2

## 2017-12-30 NOTE — Progress Notes (Signed)
Subjective:    Patient ID: Chloe Pena, female    DOB: 1996/04/26, 22 y.o.   MRN: [id number]  HPI Chloe Pena, a very pleasant 22 year old female presents accompanied by mother to establish care. 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   Patient was recently admitted to inpatient services with abdominal pain. CT of abdomin and pelvis initiated, which showed a 13 cm right ovarian cyst.  She was seen in clinic on 12/09/2017. Chloe Pena is scheduled for a laporoscopic ovarian cystecomy on 02/11/2018. Patient endorses intermittent, mild pelvic pain. She also endorses irregular menses and a family history of PCOS.   Patient has a history of generalized anxiety disorder. She has been followed by school counselor in the past for this problem. She endorses increased anxiety in crowds and social situations. She denies any suicidal or homicidal ideations. Patient has not been on medications for this problem in the past.   Chloe Pena says that she is up to date with vaccinations, she has never had a pap smear and does not perform monthly self-breast exams.  Past Medical History:  Diagnosis Date  . Asthma    as a child  . Migraine    Social History   Socioeconomic History  . Marital status: Single    Spouse name: Not on file  . Number of children: Not on file  . Years of education: Not on file  . Highest education level: Not on file  Occupational History  . Not on file  Social Needs  . Financial resource strain: Not on file  . Food insecurity:    Worry: Not on file    Inability: Not on file  . Transportation needs:    Medical: Not on file    Non-medical: Not on file  Tobacco Use  . Smoking status: Never Smoker  . Smokeless tobacco: Never Used  Substance and Sexual Activity  . Alcohol use: No  . Drug use: No  . Sexual activity: Never  Lifestyle  . Physical activity:    Days per week: Not on file    Minutes per session: Not on file  . Stress: Not on  file  Relationships  . Social connections:    Talks on phone: Not on file    Gets together: Not on file    Attends religious service: Not on file    Active member of club or organization: Not on file    Attends meetings of clubs or organizations: Not on file    Relationship status: Not on file  . Intimate partner violence:    Fear of current or ex partner: Not on file    Emotionally abused: Not on file    Physically abused: Not on file    Forced sexual activity: Not on file  Other Topics Concern  . Not on file  Social History Narrative  . Not on file   There is no immunization history on file for this patient.  Review of Systems  Constitutional: Negative for fatigue, fever and unexpected weight change.  Respiratory: Negative.   Cardiovascular: Negative.   Gastrointestinal: Negative.   Endocrine: Negative for polydipsia, polyphagia and polyuria.  Genitourinary: Negative.   Musculoskeletal: Negative.   Skin: Negative.   Hematological: Negative.   Psychiatric/Behavioral: Negative.        Objective:   Physical Exam  HENT:  Right Ear: Tympanic membrane normal. Tympanic membrane is not erythematous.  Left cerumen impaction: TM not visualized.   Eyes: Pupils  are equal, round, and reactive to light.  Neck: Normal range of motion. Neck supple.  Abdominal: Soft. Bowel sounds are normal. There is tenderness in the right lower quadrant. There is no CVA tenderness.  Abdominal obesity  Neurological: She is alert. She has normal reflexes.  Skin: Skin is warm.  Acne to face: open and closed comedones with erythema.   Psychiatric: She has a normal mood and affect. Her behavior is normal. Judgment and thought content normal.        Assessment & Plan:  1. Morbid obesity ([place]) Recommend a lowfat, low carbohydrate diet divided over 5-6 small meals, increase water intake to 6-8 glasses, and 150 minutes per week of cardiovascular exercise.   - TSH - Urinalysis Dipstick  2. PCOS  (polycystic ovarian syndrome) Discussed treatment regimen for PCOS. Metformin 500 mg daily. Patient not interested in starting medication at this time. Will discuss further after upcoming procedure.   3. Generalized anxiety disorder GAD 7 : Generalized Anxiety Score 12/30/2017  Nervous, Anxious, on Edge 2  Control/stop worrying 3  Worry too much - different things 2  Trouble relaxing 2  Restless 1  Easily annoyed or irritable 2  Afraid - awful might happen 1  Total GAD 7 Score 13  Anxiety Difficulty Extremely difficult    - Ambulatory referral to Psychology   4. Elevated liver enzymes - US Abdomen Complete; Future - Hepatic Function Panel   5. Impacted cerumen of left ear - Ear Lavage  6. Screening for diabetes mellitus - HgB A1c   7. Leukocytes in urine - Urine Culture  8. Right ovarian cyst Patient scheduled for laparoscopic ovarian cystectomy and oophorectomy on 02/11/2017 with Dr. Vivien Rota.    RTC: 3 months for CPE   Donia Pounds  MSN, FNP-C [facility] [address] [place], [place] [address] [phone]

## 2017-12-30 NOTE — Patient Instructions (Signed)
Thanks for establishing care.  Your liver enzymes were elevated, will repeat. Also, will start a low fat, low cholesterol diet and increase daily physical activity   I have placed a referral to psychology for management of anxiety.   Polycystic Ovarian Syndrome Polycystic ovarian syndrome (PCOS) is a common hormonal disorder among women of reproductive age. In most women with PCOS, many small fluid-filled sacs (cysts) grow on the ovaries, and the cysts are not part of a normal menstrual cycle. PCOS can cause problems with your menstrual periods and make it difficult to get pregnant. It can also cause an increased risk of miscarriage with pregnancy. If it is not treated, PCOS can lead to serious health problems, such as diabetes and heart disease. What are the causes? The cause of PCOS is not known, but it may be the result of a combination of certain factors, such as:  Irregular menstrual cycle.  High levels of certain hormones (androgens).  Problems with the hormone that helps to control blood sugar (insulin resistance).  Certain genes.  What increases the risk? This condition is more likely to develop in women who have a family history of PCOS. What are the signs or symptoms? Symptoms of PCOS may include:  Multiple ovarian cysts.  Infrequent periods or no periods.  Periods that are too frequent or too heavy.  Unpredictable periods.  Inability to get pregnant (infertility) because of not ovulating.  Increased growth of hair on the face, chest, stomach, back, thumbs, thighs, or toes.  Acne or oily skin. Acne may develop during adulthood, and it may not respond to treatment.  Pelvic pain.  Weight gain or obesity.  Patches of thickened and dark brown or black skin on the neck, arms, breasts, or thighs (acanthosis nigricans).  Excess hair growth on the face, chest, abdomen, or upper thighs (hirsutism).  How is this diagnosed? This condition is diagnosed based on:  Your  medical history.  A physical exam, including a pelvic exam. Your health care provider may look for areas of increased hair growth on your skin.  Tests, such as: ? Ultrasound. This may be used to examine the ovaries and the lining of the uterus (endometrium) for cysts. ? Blood tests. These may be used to check levels of sugar (glucose), female hormone (testosterone), and female hormones (estrogen and progesterone) in your blood.  How is this treated? There is no cure for PCOS, but treatment can help to manage symptoms and prevent more health problems from developing. Treatment varies depending on:  Your symptoms.  Whether you want to have a baby or whether you need birth control (contraception).  Treatment may include nutrition and lifestyle changes along with:  Progesterone hormone to start a menstrual period.  Birth control pills to help you have regular menstrual periods.  Medicines to make you ovulate, if you want to get pregnant.  Medicine to reduce excessive hair growth.  Surgery, in severe cases. This may involve making small holes in one or both of your ovaries. This decreases the amount of testosterone that your body produces.  Follow these instructions at home:  Take over-the-counter and prescription medicines only as told by your health care provider.  Follow a healthy meal plan. This can help you reduce the effects of PCOS. ? Eat a healthy diet that includes lean proteins, complex carbohydrates, fresh fruits and vegetables, low-fat dairy products, and healthy fats. Make sure to eat enough fiber.  If you are overweight, lose weight as told by your health care  provider. ? Losing 10% of your body weight may improve symptoms. ? Your health care provider can determine how much weight loss is best for you and can help you lose weight safely.  Keep all follow-up visits as told by your health care provider. This is important. Contact a health care provider if:  Your symptoms  do not get better with medicine.  You develop new symptoms. This information is not intended to replace advice given to you by your health care provider. Make sure you discuss any questions you have with your health care provider. Document Released: 12/28/2004 Document Revised: 05/01/2016 Document Reviewed: 02/19/2016 Elsevier Interactive Patient Education  2018 [employer].  Diet for Polycystic Ovarian Syndrome Polycystic ovary syndrome (PCOS) is a disorder of the chemical messengers (hormones) that regulate menstruation. The condition causes important hormones to be out of balance. PCOS can:  Make your periods irregular or stop.  Cause cysts to develop on the ovaries.  Make it difficult to get pregnant.  Stop your body from responding to the effects of insulin (insulin resistance), which can lead to obesity and diabetes.  Changing what you eat can help manage PCOS and improve your health. It can help you lose weight and improve the way your body uses insulin. What is my plan?  Eat breakfast, lunch, and dinner plus two snacks every day.  Include protein in each meal and snack.  Choose whole grains instead of products made with refined flour.  Eat a variety of foods.  Exercise regularly as told by your health care provider. What do I need to know about this eating plan? If you are overweight or obese, pay attention to how many calories you eat. Cutting down on calories can help you lose weight. Work with your health care provider or dietitian to figure out how many calories you need each day. What foods can I eat? Grains Whole grains, such as whole wheat. Whole-grain breads, crackers, cereals, and pasta. Unsweetened oatmeal, bulgur, barley, quinoa, or brown rice. Corn or whole-wheat flour tortillas. Vegetables  Lettuce. Spinach. Peas. Beets. Cauliflower. Cabbage. Broccoli. Carrots. Tomatoes. Squash. Eggplant. Herbs. Peppers. Onions. Cucumbers. Brussels  sprouts. Fruits Berries. Bananas. Apples. Oranges. Grapes. Papaya. Mango. Pomegranate. Kiwi. Grapefruit. Cherries. Meats and Other Protein Sources Lean proteins, such as fish, chicken, beans, eggs, and tofu. Dairy Low-fat dairy products, such as skim milk, cheese sticks, and yogurt. Beverages Low-fat or fat-free drinks, such as water, low-fat milk, sugar-free drinks, and 100% fruit juice. Condiments Ketchup. Mustard. Barbecue sauce. Relish. Low-fat or fat-free mayonnaise. Fats and Oils Olive oil or canola oil. Walnuts and almonds. The items listed above may not be a complete list of recommended foods or beverages. Contact your dietitian for more options. What foods are not recommended? Foods high in calories or fat. Fried foods. Sweets. Products made from refined white flour, including white bread, pastries, white rice, and pasta. The items listed above may not be a complete list of foods and beverages to avoid. Contact your dietitian for more information. This information is not intended to replace advice given to you by your health care provider. Make sure you discuss any questions you have with your health care provider. Document Released: 12/26/2015 Document Revised: 02/09/2016 Document Reviewed: 09/15/2014 Elsevier Interactive Patient Education  2018 [employer].

## 2017-12-31 ENCOUNTER — Telehealth: Payer: Self-pay

## 2017-12-31 LAB — HEPATIC FUNCTION PANEL
ALK PHOS: 108 IU/L (ref 39–117)
ALT: 94 IU/L — AB (ref 0–32)
AST: 49 IU/L — AB (ref 0–40)
Albumin: 4.6 g/dL (ref 3.5–5.5)
BILIRUBIN TOTAL: 0.9 mg/dL (ref 0.0–1.2)
Bilirubin, Direct: 0.18 mg/dL (ref 0.00–0.40)
Total Protein: 7.5 g/dL (ref 6.0–8.5)

## 2017-12-31 LAB — CA 125: CANCER ANTIGEN (CA) 125: 18.6 U/mL (ref 0.0–38.1)

## 2017-12-31 NOTE — Telephone Encounter (Signed)
-----   Message from Dorena Dew, [place] sent at 12/31/2017 12:04 PM EDT ----- Regarding: lab results Please inform patient that liver enzymes are trending down from previous values, but remain elevated. Will follow up by phone after reviewing abdominal ultrasound. Recommend a lowfat, low carbohydrate diet divided over 5-6 small meals, increase water intake to 6-8 glasses, and 150 minutes per week of cardiovascular exercise.  Can you please send psychology referral to the [facility] if her insurance is accepted.   Donia Pounds  MSN, FNP-C [facility] [address] [place], [place] [address] [phone]

## 2017-12-31 NOTE — Telephone Encounter (Signed)
Called and spoke with patient's mother. Advised that liver enzymes are trending down but still remain elevated. Advised that we will follow up by phone after reviewing ultrasound. Recommended that patient eat a low fat/ low carb diet over 5 to 6 small meals daily, asked that she increase water intake to 6 to 8 glasses daily and exercise 150 minutes weekly of cardiovascular exercise. Advised that we are sending psychology referral to [facility] and they will be calling to scheduled an appointment. Thanks!

## 2018-01-01 LAB — SPECIMEN STATUS REPORT

## 2018-01-01 LAB — URINE CULTURE

## 2018-01-01 LAB — TSH: TSH: 2.26 u[IU]/mL (ref 0.450–4.500)

## 2018-01-06 ENCOUNTER — Telehealth: Payer: Self-pay

## 2018-01-06 ENCOUNTER — Encounter: Payer: BLUE CROSS/BLUE SHIELD | Admitting: Obstetrics and Gynecology

## 2018-01-06 ENCOUNTER — Ambulatory Visit (HOSPITAL_COMMUNITY)
Admission: RE | Admit: 2018-01-06 | Discharge: 2018-01-06 | Disposition: A | Discharge status: Home / Self Care | Payer: BLUE CROSS/BLUE SHIELD | Source: Ambulatory Visit | Attending: Family Medicine | Admitting: Family Medicine

## 2018-01-06 DIAGNOSIS — R748 Abnormal levels of other serum enzymes: Secondary | ICD-10-CM | POA: Insufficient documentation

## 2018-01-06 NOTE — Telephone Encounter (Signed)
REFILL HAS BEEN FAXED TO [facility] TODAY 01/06/2018 @1 :21PM. THANKS!

## 2018-01-07 ENCOUNTER — Telehealth: Payer: Self-pay

## 2018-01-07 NOTE — Telephone Encounter (Signed)
Called and spoke with patients mother. Advised that abdominal ultrasound is consistent with fatty liver infiltrates. Advised that this typically occurs with slow metabolism and the primary way to correct this is to eat a balanced healthy diet and exercise. Recommended 150 minutes a week of cardio and to avoid alcoholic beverages. Thanks!

## 2018-01-07 NOTE — Telephone Encounter (Signed)
-----   Message from Dorena Dew, [place] sent at 01/07/2018  1:25 PM EDT ----- Regarding: image results Please inform patient that abdominal ultrasound is consistent with fatty liver infiltrates. Non alcoholic fatty liver typically occurs with metabolic syndrome or slow metabolism.  Primary way to correct is to eat a balanced healthy diet, 150 minutes of cardiovascular activity per week, lose 10% of body weight, avoid alcoholic beverages.   Follow up in office as scheduled   Donia Pounds  MSN, FNP-C [facility] [address] [place], [place] [address] [phone]

## 2018-01-31 NOTE — Patient Instructions (Addendum)
Your procedure is scheduled on: Tuesday, May 28  Enter through the Main Entrance of [facility] at: 8:15 am  Pick up the phone at the desk and dial [phone].  Call this number if you have problems the morning of surgery: [phone].  Remember: Do NOT eat food or Do NOT drink clear liquids (including water) after midnight Monday.  Take these medicines the morning of surgery with a SIP OF WATER: None  Stop herbal medications, vitamin supplements and ibuprofen at this time.  Do NOT wear jewelry (body piercing), metal hair clips/bobby pins, make-up, or nail polish. Do NOT wear lotions, powders, or perfumes.  You may wear deoderant. Do NOT shave for 48 hours prior to surgery. Do NOT bring valuables to the hospital.  Have a responsible adult drive you home and stay with you for 24 hours after your procedure.  Home with [name] cell [phone] or"Mom's Husband Delrae Alfred" cell [phone].

## 2018-02-03 ENCOUNTER — Encounter (HOSPITAL_COMMUNITY): Payer: Self-pay | Admitting: *Deleted

## 2018-02-03 ENCOUNTER — Encounter (HOSPITAL_COMMUNITY)
Admission: RE | Admit: 2018-02-03 | Discharge: 2018-02-03 | Disposition: A | Discharge status: Home / Self Care | Payer: BLUE CROSS/BLUE SHIELD | Source: Ambulatory Visit | Attending: Obstetrics and Gynecology | Admitting: Obstetrics and Gynecology

## 2018-02-03 ENCOUNTER — Other Ambulatory Visit: Payer: Self-pay

## 2018-02-03 DIAGNOSIS — Z01812 Encounter for preprocedural laboratory examination: Secondary | ICD-10-CM | POA: Diagnosis not present

## 2018-02-03 HISTORY — DX: Anxiety disorder, unspecified: F41.9

## 2018-02-03 HISTORY — DX: Polycystic ovarian syndrome: E28.2

## 2018-02-03 LAB — CBC
HEMATOCRIT: 36.8 % (ref 36.0–46.0)
Hemoglobin: 11.8 g/dL — ABNORMAL LOW (ref 12.0–15.0)
MCH: 30.8 pg (ref 26.0–34.0)
MCHC: 32.1 g/dL (ref 30.0–36.0)
MCV: 96.1 fL (ref 78.0–100.0)
Platelets: 294 10*3/uL (ref 150–400)
RBC: 3.83 MIL/uL — ABNORMAL LOW (ref 3.87–5.11)
RDW: 13.6 % (ref 11.5–15.5)
WBC: 12 10*3/uL — ABNORMAL HIGH (ref 4.0–10.5)

## 2018-02-11 ENCOUNTER — Ambulatory Visit (HOSPITAL_COMMUNITY): Payer: BLUE CROSS/BLUE SHIELD | Admitting: Certified Registered Nurse Anesthetist

## 2018-02-11 ENCOUNTER — Ambulatory Visit (HOSPITAL_COMMUNITY)
Admission: AD | Admit: 2018-02-11 | Discharge: 2018-02-11 | Disposition: A | Discharge status: Home / Self Care | Payer: BLUE CROSS/BLUE SHIELD | Source: Ambulatory Visit | Attending: Obstetrics and Gynecology | Admitting: Obstetrics and Gynecology

## 2018-02-11 ENCOUNTER — Other Ambulatory Visit: Payer: Self-pay

## 2018-02-11 ENCOUNTER — Encounter (HOSPITAL_COMMUNITY): Payer: Self-pay | Admitting: *Deleted

## 2018-02-11 ENCOUNTER — Encounter (HOSPITAL_COMMUNITY)
Admission: AD | Disposition: A | Discharge status: Home / Self Care | Payer: Self-pay | Source: Ambulatory Visit | Attending: Obstetrics and Gynecology

## 2018-02-11 DIAGNOSIS — N8353 Torsion of ovary, ovarian pedicle and fallopian tube: Secondary | ICD-10-CM | POA: Diagnosis not present

## 2018-02-11 DIAGNOSIS — N83202 Unspecified ovarian cyst, left side: Secondary | ICD-10-CM | POA: Diagnosis not present

## 2018-02-11 DIAGNOSIS — N83512 Torsion of left ovary and ovarian pedicle: Secondary | ICD-10-CM

## 2018-02-11 DIAGNOSIS — N838 Other noninflammatory disorders of ovary, fallopian tube and broad ligament: Secondary | ICD-10-CM

## 2018-02-11 DIAGNOSIS — N83522 Torsion of left fallopian tube: Secondary | ICD-10-CM | POA: Diagnosis not present

## 2018-02-11 DIAGNOSIS — Z6841 Body Mass Index (BMI) 40.0 and over, adult: Secondary | ICD-10-CM | POA: Diagnosis not present

## 2018-02-11 DIAGNOSIS — K660 Peritoneal adhesions (postprocedural) (postinfection): Secondary | ICD-10-CM | POA: Diagnosis not present

## 2018-02-11 HISTORY — PX: LAPAROSCOPIC OVARIAN CYSTECTOMY: SHX6248

## 2018-02-11 LAB — PREGNANCY, URINE: PREG TEST UR: NEGATIVE

## 2018-02-11 SURGERY — EXCISION, CYST, OVARY, LAPAROSCOPIC
Anesthesia: General

## 2018-02-11 MED ORDER — CELECOXIB 200 MG PO CAPS
ORAL_CAPSULE | ORAL | Status: AC
  Filled 2018-02-11: qty 1

## 2018-02-11 MED ORDER — ACETAMINOPHEN 160 MG/5ML PO SOLN: 960.0000 mg | Freq: Once | ORAL | Status: AC

## 2018-02-11 MED ORDER — DOCUSATE SODIUM 100 MG PO CAPS: 100.0000 mg | ORAL_CAPSULE | Freq: Every day | ORAL | 2 refills | Status: DC

## 2018-02-11 MED ORDER — IBUPROFEN 600 MG PO TABS: 600.0000 mg | ORAL_TABLET | Freq: Four times a day (QID) | ORAL | 1 refills | Status: DC | PRN

## 2018-02-11 MED ORDER — GLYCOPYRROLATE 0.2 MG/ML IJ SOLN
INTRAMUSCULAR | Status: AC
  Filled 2018-02-11: qty 1

## 2018-02-11 MED ORDER — BUPIVACAINE HCL (PF) 0.5 % IJ SOLN
INTRAMUSCULAR | Status: DC | PRN
  Administered 2018-02-11: 17 mL

## 2018-02-11 MED ORDER — PROPOFOL 10 MG/ML IV BOLUS
INTRAVENOUS | Status: DC | PRN
  Administered 2018-02-11: 100 mg via INTRAVENOUS
  Administered 2018-02-11: 200 mg via INTRAVENOUS

## 2018-02-11 MED ORDER — ONDANSETRON HCL 4 MG/2ML IJ SOLN
INTRAMUSCULAR | Status: AC
  Filled 2018-02-11: qty 2

## 2018-02-11 MED ORDER — SUGAMMADEX SODIUM 200 MG/2ML IV SOLN
INTRAVENOUS | Status: AC
  Filled 2018-02-11: qty 2

## 2018-02-11 MED ORDER — DEXAMETHASONE SODIUM PHOSPHATE 10 MG/ML IJ SOLN
INTRAMUSCULAR | Status: AC
  Filled 2018-02-11: qty 1

## 2018-02-11 MED ORDER — MIDAZOLAM HCL 2 MG/2ML IJ SOLN
INTRAMUSCULAR | Status: AC
  Filled 2018-02-11: qty 2

## 2018-02-11 MED ORDER — OXYCODONE-ACETAMINOPHEN 5-325 MG PO TABS: 1.0000 | ORAL_TABLET | Freq: Four times a day (QID) | ORAL | 0 refills | Status: DC | PRN

## 2018-02-11 MED ORDER — CELECOXIB 200 MG PO CAPS
200.0000 mg | ORAL_CAPSULE | Freq: Once | ORAL | Status: AC | PRN
  Administered 2018-02-11: 200 mg via ORAL

## 2018-02-11 MED ORDER — FENTANYL CITRATE (PF) 250 MCG/5ML IJ SOLN
INTRAMUSCULAR | Status: AC
  Filled 2018-02-11: qty 5

## 2018-02-11 MED ORDER — LACTATED RINGERS IV SOLN
INTRAVENOUS | Status: DC
  Administered 2018-02-11 (×2): via INTRAVENOUS

## 2018-02-11 MED ORDER — KETOROLAC TROMETHAMINE 30 MG/ML IJ SOLN
INTRAMUSCULAR | Status: AC
  Filled 2018-02-11: qty 1

## 2018-02-11 MED ORDER — LIDOCAINE HCL (CARDIAC) PF 100 MG/5ML IV SOSY
PREFILLED_SYRINGE | INTRAVENOUS | Status: DC | PRN
  Administered 2018-02-11: 100 mg via INTRAVENOUS

## 2018-02-11 MED ORDER — SODIUM CHLORIDE 0.9 % IR SOLN
Status: DC | PRN
  Administered 2018-02-11: 3000 mL

## 2018-02-11 MED ORDER — DEXAMETHASONE SODIUM PHOSPHATE 10 MG/ML IJ SOLN
INTRAMUSCULAR | Status: DC | PRN
  Administered 2018-02-11: 10 mg via INTRAVENOUS

## 2018-02-11 MED ORDER — LIDOCAINE HCL (CARDIAC) PF 100 MG/5ML IV SOSY
PREFILLED_SYRINGE | INTRAVENOUS | Status: AC
  Filled 2018-02-11: qty 5

## 2018-02-11 MED ORDER — HYDROMORPHONE HCL 1 MG/ML IJ SOLN: 0.2500 mg | INTRAMUSCULAR | Status: DC | PRN

## 2018-02-11 MED ORDER — ROCURONIUM BROMIDE 100 MG/10ML IV SOLN
INTRAVENOUS | Status: DC | PRN
  Administered 2018-02-11: 50 mg via INTRAVENOUS
  Administered 2018-02-11: 10 mg via INTRAVENOUS

## 2018-02-11 MED ORDER — FAMOTIDINE 20 MG PO TABS
ORAL_TABLET | ORAL | Status: AC
  Filled 2018-02-11: qty 1

## 2018-02-11 MED ORDER — NEOSTIGMINE METHYLSULFATE 10 MG/10ML IV SOLN
INTRAVENOUS | Status: AC
  Filled 2018-02-11: qty 1

## 2018-02-11 MED ORDER — OXYCODONE HCL 5 MG PO TABS: 5.0000 mg | ORAL_TABLET | Freq: Once | ORAL | Status: DC | PRN

## 2018-02-11 MED ORDER — PROMETHAZINE HCL 25 MG/ML IJ SOLN: 6.2500 mg | INTRAMUSCULAR | Status: DC | PRN

## 2018-02-11 MED ORDER — ACETAMINOPHEN 500 MG PO TABS
ORAL_TABLET | ORAL | Status: AC
  Filled 2018-02-11: qty 2

## 2018-02-11 MED ORDER — PROPOFOL 10 MG/ML IV BOLUS
INTRAVENOUS | Status: AC
  Filled 2018-02-11: qty 20

## 2018-02-11 MED ORDER — KETOROLAC TROMETHAMINE 30 MG/ML IJ SOLN
INTRAMUSCULAR | Status: DC | PRN
  Administered 2018-02-11: 30 mg via INTRAVENOUS

## 2018-02-11 MED ORDER — BUPIVACAINE HCL (PF) 0.5 % IJ SOLN
INTRAMUSCULAR | Status: AC
  Filled 2018-02-11: qty 30

## 2018-02-11 MED ORDER — OXYCODONE HCL 5 MG/5ML PO SOLN: 5.0000 mg | Freq: Once | ORAL | Status: DC | PRN

## 2018-02-11 MED ORDER — ONDANSETRON HCL 4 MG/2ML IJ SOLN
INTRAMUSCULAR | Status: DC | PRN
  Administered 2018-02-11 (×2): 2 mg via INTRAVENOUS

## 2018-02-11 MED ORDER — GLYCOPYRROLATE 0.2 MG/ML IJ SOLN
INTRAMUSCULAR | Status: DC | PRN
  Administered 2018-02-11 (×2): 0.1 mg via INTRAVENOUS

## 2018-02-11 MED ORDER — MIDAZOLAM HCL 2 MG/2ML IJ SOLN
INTRAMUSCULAR | Status: DC | PRN
  Administered 2018-02-11: 1 mg via INTRAVENOUS
  Administered 2018-02-11: 1.5 mg via INTRAVENOUS

## 2018-02-11 MED ORDER — ACETAMINOPHEN 500 MG PO TABS
1000.0000 mg | ORAL_TABLET | Freq: Once | ORAL | Status: AC
  Administered 2018-02-11: 1000 mg via ORAL

## 2018-02-11 MED ORDER — SCOPOLAMINE 1 MG/3DAYS TD PT72
MEDICATED_PATCH | TRANSDERMAL | Status: AC
  Filled 2018-02-11: qty 1

## 2018-02-11 MED ORDER — SUGAMMADEX SODIUM 200 MG/2ML IV SOLN
INTRAVENOUS | Status: DC | PRN
  Administered 2018-02-11: 200 mg via INTRAVENOUS

## 2018-02-11 MED ORDER — SCOPOLAMINE 1 MG/3DAYS TD PT72
1.0000 | MEDICATED_PATCH | Freq: Once | TRANSDERMAL | Status: DC
  Administered 2018-02-11: 1.5 mg via TRANSDERMAL

## 2018-02-11 MED ORDER — FAMOTIDINE 20 MG PO TABS
20.0000 mg | ORAL_TABLET | Freq: Once | ORAL | Status: AC
  Administered 2018-02-11: 20 mg via ORAL

## 2018-02-11 MED ORDER — FENTANYL CITRATE (PF) 100 MCG/2ML IJ SOLN
INTRAMUSCULAR | Status: DC | PRN
  Administered 2018-02-11: 100 ug via INTRAVENOUS
  Administered 2018-02-11: 50 ug via INTRAVENOUS
  Administered 2018-02-11: 100 ug via INTRAVENOUS

## 2018-02-11 MED ORDER — ROCURONIUM BROMIDE 100 MG/10ML IV SOLN
INTRAVENOUS | Status: AC
  Filled 2018-02-11: qty 1

## 2018-02-11 SURGICAL SUPPLY — 31 items
APPLICATOR ARISTA FLEXITIP XL (MISCELLANEOUS) IMPLANT
DRSG COVADERM PLUS 2X2 (GAUZE/BANDAGES/DRESSINGS) IMPLANT
DRSG OPSITE POSTOP 3X4 (GAUZE/BANDAGES/DRESSINGS) IMPLANT
DURAPREP 26ML APPLICATOR (WOUND CARE) ×2 IMPLANT
GLOVE BIOGEL PI IND STRL 6.5 (GLOVE) ×2 IMPLANT
GLOVE BIOGEL PI IND STRL 7.0 (GLOVE) ×2 IMPLANT
GLOVE BIOGEL PI INDICATOR 6.5 (GLOVE) ×2
GLOVE BIOGEL PI INDICATOR 7.0 (GLOVE) ×2
GLOVE ORTHOPEDIC STR SZ6.5 (GLOVE) ×2 IMPLANT
GOWN STRL REUS W/TWL LRG LVL3 (GOWN DISPOSABLE) ×6 IMPLANT
HEMOSTAT ARISTA ABSORB 3G PWDR (MISCELLANEOUS) IMPLANT
HOVERMATT SINGLE USE (MISCELLANEOUS) ×2 IMPLANT
NS IRRIG 1000ML POUR BTL (IV SOLUTION) ×2 IMPLANT
PACK LAPAROSCOPY BASIN (CUSTOM PROCEDURE TRAY) ×2 IMPLANT
PACK TRENDGUARD 600 HYBRD PROC (MISCELLANEOUS) ×1 IMPLANT
POUCH SPECIMEN RETRIEVAL 10MM (ENDOMECHANICALS) ×4 IMPLANT
PROTECTOR NERVE ULNAR (MISCELLANEOUS) ×4 IMPLANT
SET IRRIG TUBING LAPAROSCOPIC (IRRIGATION / IRRIGATOR) ×2 IMPLANT
SHEARS HARMONIC ACE PLUS 36CM (ENDOMECHANICALS) ×2 IMPLANT
SPONGE LAP 18X18 RF (DISPOSABLE) ×2 IMPLANT
SUT MNCRL AB 4-0 PS2 18 (SUTURE) ×2 IMPLANT
SUT VICRYL 0 UR6 27IN ABS (SUTURE) ×2 IMPLANT
SYRINGE 60CC LL (MISCELLANEOUS) ×4 IMPLANT
SYSTEM CARTER THOMASON II (TROCAR) ×2 IMPLANT
TOWEL OR 17X24 6PK STRL BLUE (TOWEL DISPOSABLE) ×4 IMPLANT
TRAY FOLEY W/BAG SLVR 14FR (SET/KITS/TRAYS/PACK) ×2 IMPLANT
TRENDGUARD 600 HYBRID PROC PK (MISCELLANEOUS) ×2
TROCAR 5M 150ML BLDLS (TROCAR) ×4 IMPLANT
TROCAR XCEL NON-BLD 11X100MML (ENDOMECHANICALS) ×2 IMPLANT
TROCAR XCEL NON-BLD 5MMX100MML (ENDOMECHANICALS) ×2 IMPLANT
WARMER LAPAROSCOPE (MISCELLANEOUS) ×2 IMPLANT

## 2018-02-11 NOTE — Transfer of Care (Signed)
Immediate Anesthesia Transfer of Care Note  Patient: Chloe Pena  Procedure(s) Performed: LAPAROSCOPIC OVARIAN CYSTECTOMY (N/A )  Patient Location: PACU  Anesthesia Type:General  Level of Consciousness: awake, alert  and oriented  Airway & Oxygen Therapy: Patient Spontanous Breathing and Patient connected to face mask oxygen  Post-op Assessment: Report given to RN, Post -op Vital signs reviewed and stable and Patient moving all extremities X 4  Post vital signs: Reviewed and stable  Last Vitals:  Vitals Value Taken Time  BP 110/49 02/11/2018 12:03 PM  Temp    Pulse 98 02/11/2018 12:05 PM  Resp 26 02/11/2018 12:05 PM  SpO2 96 % 02/11/2018 12:05 PM  Vitals shown include unvalidated device data.  Last Pain:  Vitals:   02/11/18 0832  TempSrc: Oral  PainSc: 0-No pain      Patients Stated Pain Goal: 2 (16/10/96 0454)  Complications: No apparent anesthesia complications

## 2018-02-11 NOTE — Anesthesia Postprocedure Evaluation (Signed)
Anesthesia Post Note  Patient: Chloe Pena  Procedure(s) Performed: LAPAROSCOPIC OVARIAN CYSTECTOMY (N/A )     Patient location during evaluation: PACU Anesthesia Type: General Level of consciousness: awake and alert Pain management: pain level controlled Vital Signs Assessment: post-procedure vital signs reviewed and stable Respiratory status: spontaneous breathing, nonlabored ventilation, respiratory function stable and patient connected to nasal cannula oxygen Cardiovascular status: blood pressure returned to baseline and stable Postop Assessment: no apparent nausea or vomiting Anesthetic complications: no    Last Vitals:  Vitals:   02/11/18 1330 02/11/18 1400  BP: 129/77   Pulse: 77   Resp: 18   Temp:  36.8 C  SpO2: 99%     Last Pain:  Vitals:   02/11/18 1400  TempSrc:   PainSc: 0-No pain   Pain Goal: Patients Stated Pain Goal: 2 (02/11/18 1300)               Ryan P Ellender

## 2018-02-11 NOTE — Op Note (Addendum)
Chloe Pena PROCEDURE DATE: 02/11/2018  PREOPERATIVE DIAGNOSES: right ovarian cyst POSTOPERATIVE DIAGNOSES: left fallopian tube cyst, left ovarian and fallopian tube cyst torsion PROCEDURE: laparoscopic drainage of left fallopian tube cyst with left salpingectomy and cystectomy SURGEON:  Dr. Feliz Beam ASSISTANT: Lavonia Drafts, MD An experienced assistant was required given the standard of surgical care given the complexity of the case.  This assistant was needed for exposure, dissection, suctioning, retraction, instrument exchange, and for overall help during the procedure.  ANESTHESIOLOGIST: Dr. Roanna Banning  INDICATIONS: 22 y.o. G0P0000 with aforementioned preoperative diagnoses here today for definitive surgical management.   Risks of surgery were discussed with the patient including but not limited to: bleeding which may require transfusion or reoperation; infection which may require antibiotics; injury to bowel, bladder, ureters or other surrounding organs; need for additional procedures including laparotomy; thromboembolic phenomenon, incisional problems and other postoperative/anesthesia complications. Written informed consent was obtained.    FINDINGS:  Normal appearing uterus, right ovary and fallopian tube. Large left fallopian tube cyst crowding into the midline, with appearance of rising from fallopian tube and adherent to normal appearing left ovary with torsion noted of both tube and ovary. Adhesions of sigmoid colon adhered to left pelvic sidewall  ANESTHESIA:    General INTRAVENOUS FLUIDS: 1000 ml ESTIMATED BLOOD LOSS: 30 ml SPECIMENS:  Left fallopian tube and tubal cyst COMPLICATIONS: None immediate   PROCEDURE IN DETAIL:  The patient had sequential compression devices applied to her lower extremities while in the preoperative area.  She was then taken to the operating room where general anesthesia was administered and was found to be adequate.  She was placed  in the dorsal lithotomy position, and was prepped and draped in a sterile manner.  A Foley catheter was inserted into her bladder and attached to constant drainage. A timeout was performed to verify patient and procedure. A sponge on a stick was advanced into the vagina and a posterior hymenal tear was noted to be bleeding and was repaired with one figure of 8 with 2-0 Vicryl. Attention was then turned to the patient's abdomen where a 5-mm skin incision was made approximated 3 cm above the umbilicus due to the patient's body habitus. The endoscope was advanced using the Optiview port under direct visualization until entrance to the abdomen was obtained. The Optiview inserter was removed and the scope replaced within the trocar into the abdomen and atraumatic entrance confirmed. The abdomen was then insufflated with carbon dioxide gas. Pneumoperitoneum was obtained to 15 mm Hg. Survey of the abdomen and pelvis were as noted above.  A 5 mm port was placed into the right lower abdomen above the pannus, lateral to the midline and inferior to the umbilicus after incision with a scalpel. The 5 mm non-bladed port was advanced under direct visualization using the laparoscope. A 10 mm non-bladed port was placed under direct visualization with the laparoscope in the left lower abdomen lateral to the midline and inferior to the umbilicus, above the pannus after incision with the scalpel. The patient was placed into trendelenburg and the anatomy was surveyed, with the cyst noted to arise from the left fallopian tube. The fimbriated end of the fallopian tube was just visible and grasped and the distal end of the tube and cyst were separated from the ovary using the Harmonic scalpel. Once the cyst was free from the ovary, the ovary was inspected and found to be hemostatic and normal appearing.  A laparoscopic needle aspirator was introduced and dark brown  fluid was aspirated from the cyst until it had decreased in size enough  to fit into a 10 mm endocatch bag. The cyst was then placed into the bag and brought to the abdominal wall where the cyst and fluid were removed with the bag intact. The abdomen was then surveyed again and the pedicle from the left salpingectomy was noted to be hemostatic. The pelvis was irrigated and suctioned.   At this point the procedure was completed and the 10 mm port site fascia was closed with a Carter-Thompson device. Pneumoperitoneum removed as much as possible. The skin of all ports was closed using 4-0 Monocryl. The port sites were injected with 0.5% Marcaine. The sponge stick was removed from the vagina and a small amount of bleeding noted at posterior fornix, which subsided with one figure of 8 suture with 2-0 Vicryl.   The patient was extubated without difficulty and taken to PACU in stable condition.   The patient will be discharged to home as per PACU criteria.  Routine postoperative instructions given.  She was prescribed Percocet, Ibuprofen and Colace.  She will follow up in the clinic in 2 weeks for postoperative evaluation.   Feliz Beam, M.D. Attending [place], [facility] for [employer], [place]

## 2018-02-11 NOTE — Discharge Instructions (Addendum)
Diagnostic Laparoscopy, Care After Refer to this sheet in the next few weeks. These instructions provide you with information about caring for yourself after your procedure. Your health care provider may also give you more specific instructions. Your treatment has been planned according to current medical practices, but problems sometimes occur. Call your health care provider if you have any problems or questions after your procedure. What can I expect after the procedure? After your procedure, it is common to have mild discomfort in the throat and abdomen. Follow these instructions at home:  Take over-the-counter and prescription medicines only as told by your health care provider.  Do not drive for 24 hours if you received a sedative.  Return to your normal activities as told by your health care provider.  Do not take baths, swim, or use a hot tub until your health care provider approves. You may shower.  Follow instructions from your health care provider about how to take care of your incision. Make sure you: ? Wash your hands with soap and water before you change your bandage (dressing). If soap and water are not available, use hand sanitizer. ? Change your dressing as told by your health care provider. ? Leave stitches (sutures), skin glue, or adhesive strips in place. These skin closures may need to stay in place for 2 weeks or longer. If adhesive strip edges start to loosen and curl up, you may trim the loose edges. Do not remove adhesive strips completely unless your health care provider tells you to do that.  Check your incision area every day for signs of infection. Check for: ? More redness, swelling, or pain. ? More fluid or blood. ? Warmth. ? Pus or a bad smell.  It is your responsibility to get the results of your procedure. Ask your health care provider or the department performing the procedure when your results will be ready. Contact a health care provider if:  There is  new pain in your shoulders.  You feel light-headed or faint.  You are unable to pass gas or unable to have a bowel movement.  You feel nauseous or you vomit.  You develop a rash.  You have more redness, swelling, or pain around your incision.  You have more fluid or blood coming from your incision.  Your incision feels warm to the touch.  You have pus or a bad smell coming from your incision.  You have a fever or chills. Get help right away if:  Your pain is getting worse.  You have ongoing vomiting.  The edges of your incision open up.  You have trouble breathing.  You have chest pain. This information is not intended to replace advice given to you by your health care provider. Make sure you discuss any questions you have with your health care provider. Document Released: 08/15/2015 Document Revised: 02/09/2016 Document Reviewed: 05/17/2015 Elsevier Interactive Patient Education  2018 [employer] Instructions  NO IBUPROFEN PRODUCTS UNTIL 5:30 PM TODAY   Activity: Get plenty of rest for the remainder of the day. A responsible individual must stay with you for 24 hours following the procedure.  For the next 24 hours, DO NOT: -Drive a car -Paediatric nurse -Drink alcoholic beverages -Take any medication unless instructed by your physician -Make any legal decisions or sign important papers.  Meals: Start with liquid foods such as gelatin or soup. Progress to regular foods as tolerated. Avoid greasy, spicy, heavy foods. If nausea and/or vomiting occur,  drink only clear liquids until the nausea and/or vomiting subsides. Call your physician if vomiting continues.  Special Instructions/Symptoms: Your throat may feel dry or sore from the anesthesia or the breathing tube placed in your throat during surgery. If this causes discomfort, gargle with warm salt water. The discomfort should disappear within 24 hours.  If you had a scopolamine  patch placed behind your ear for the management of post- operative nausea and/or vomiting:  1. The medication in the patch is effective for 72 hours, after which it should be removed.  Wrap patch in a tissue and discard in the trash. Wash hands thoroughly with soap and water. 2. You may remove the patch earlier than 72 hours if you experience unpleasant side effects which may include dry mouth, dizziness or visual disturbances. 3. Avoid touching the patch. Wash your hands with soap and water after contact with the patch.

## 2018-02-11 NOTE — H&P (Signed)
OB/GYN History and Physical  Chloe Pena is a 22 y.o. G0P0000 presenting for surgical management of 14 cm ovarian cyst. H/op PCOS, had Nexplanon placed in office 12/23/17. Has had intermittent abdominal pain in past on right side, not currently having significant pain. CT states enlarged cyst on left, [place] with enlarged cyst on right.       Past Medical History:  Diagnosis Date  . Anxiety    no meds  . Asthma    as a child, no inhaler, no problems as adult  . Migraine    otc med prn  . PCOS (polycystic ovarian syndrome)     Past Surgical History:  Procedure Laterality Date  . WISDOM TOOTH EXTRACTION      OB History  Gravida Para Term Preterm AB Living  0 0 0 0 0 0  SAB TAB Ectopic Multiple Live Births  0 0 0 0      Social History   Socioeconomic History  . Marital status: Single    Spouse name: Not on file  . Number of children: Not on file  . Years of education: Not on file  . Highest education level: Not on file  Occupational History  . Not on file  Social Needs  . Financial resource strain: Not on file  . Food insecurity:    Worry: Not on file    Inability: Not on file  . Transportation needs:    Medical: Not on file    Non-medical: Not on file  Tobacco Use  . Smoking status: Never Smoker  . Smokeless tobacco: Never Used  Substance and Sexual Activity  . Alcohol use: No  . Drug use: No  . Sexual activity: Never    Comment: Nexplanon inserted 12/23/17  Lifestyle  . Physical activity:    Days per week: Not on file    Minutes per session: Not on file  . Stress: Not on file  Relationships  . Social connections:    Talks on phone: Not on file    Gets together: Not on file    Attends religious service: Not on file    Active member of club or organization: Not on file    Attends meetings of clubs or organizations: Not on file    Relationship status: Not on file  Other Topics Concern  . Not on file  Social History Narrative  . Not on file     Family History  Problem Relation Age of Onset  . Migraines Mother   . GER disease Mother   . Heart disease Mother   . Heart failure Mother   . Cancer Maternal Grandmother   . Hypertension Maternal Grandmother   . Diabetes Maternal Grandmother   . Diabetes Father   . Thyroid disease Brother     Medications Prior to Admission  Medication Sig Dispense Refill Last Dose  . ibuprofen (ADVIL,MOTRIN) 200 MG tablet Take 400 mg by mouth every 8 (eight) hours as needed for moderate pain.    Past Month at Unknown time  . etonogestrel (NEXPLANON) 68 MG IMPL implant 1 each by Subdermal route once.     . rizatriptan (MAXALT-MLT) 10 MG disintegrating tablet Take at onset of headache, may take second dose after 2 hours. Do not take more than 2 a week. (Patient not taking: Reported on 01/28/2018) 10 tablet 11 Not Taking at Unknown time  . topiramate (TOPAMAX) 25 MG tablet Take 1 tablet at bedtime (Patient not taking: Reported on 01/28/2018) 30 tablet 6 Not  Taking at Unknown time    No Known Allergies  Review of Systems: Negative except for what is mentioned in HPI.     Physical Exam: BP (!) 161/82   Pulse 91   Temp 98.7 F (37.1 C) (Oral)   Resp 18   LMP  (LMP Unknown) Comment: May 2019  SpO2 100%  CONSTITUTIONAL: Well-developed, well-nourished female in no acute distress. Mildly anxious. Obese. HENT:  Normocephalic, atraumatic, External right and left ear normal. Oropharynx is clear and moist EYES: Conjunctivae and EOM are normal. Pupils are equal, round, and reactive to light. No scleral icterus.  NECK: Normal range of motion, supple, no masses SKIN: Skin is warm and dry. No rash noted. Not diaphoretic. No erythema. No pallor. [place]: Alert and oriented to person, place, and time. Normal reflexes, muscle tone coordination. No cranial nerve deficit noted. PSYCHIATRIC: Normal mood and affect. Normal behavior. Normal judgment and thought content. CARDIOVASCULAR: Normal heart rate noted,  regular rhythm RESPIRATORY: Effort normal, no problems with respiration noted ABDOMEN: Soft, nontender, nondistended PELVIC: Deferred MUSCULOSKELETAL: Normal range of motion. No edema and no tenderness. 2+ distal pulses.   Pertinent Labs/Studies:   Results for orders placed or performed during the hospital encounter of 02/11/18 (from the past 72 hour(s))  Pregnancy, urine     Status: None   Collection Time: 02/11/18  8:20 AM  Result Value Ref Range   Preg Test, Ur NEGATIVE NEGATIVE    Comment:        THE SENSITIVITY OF THIS METHODOLOGY IS >20 mIU/mL. Performed at [facility], [address], [place], [place] [address]        Assessment and Plan :Chloe Pena is a 22 y.o. G0P0000 admitted for laparoscopic ovarian cystectomy for 14 cm ovarian cyst, with neg Ca125. Reviewed risks of laparoscopic ovarian cystectomy with patient. Reviewed risks including infection, hemorrhage, damage to surrounding tissue and organs, possibility of laparotomy or other indicated procedures. Reviewed plan to perform cystectomy however may have to perform oophorectomy. Reviewed expected fertility with one ovary as opposed to two. Reviewed other reproductive structures will be visualized and removed only if grossly abnormal appearing. Reviewed risks of anesthesia including risk of clot, stroke, MI. She consents to blood transfusion if necessary. Reviewed expected recovery course. Answered all questions, patient signed consent.   Plan for laparoscopic ovarian cystectomy, possibly oophorectomy NPO UPT negative   K. Arvilla Meres, M.D. Attending [place], [facility] for [employer], [place]

## 2018-02-11 NOTE — Anesthesia Procedure Notes (Signed)
Procedure Name: Intubation Date/Time: 02/11/2018 9:47 AM Performed by: Murvin Natal, MD Pre-anesthesia Checklist: Patient identified, Patient being monitored, Timeout performed, Emergency Drugs available and Suction available Patient Re-evaluated:Patient Re-evaluated prior to induction Oxygen Delivery Method: Circle System Utilized Preoxygenation: Pre-oxygenation with 100% oxygen Induction Type: IV induction Ventilation: Mask ventilation without difficulty Laryngoscope Size: Miller and 2 Grade View: Grade II Tube type: Oral Tube size: 7.0 mm Number of attempts: 1 Airway Equipment and Method: stylet Placement Confirmation: ETT inserted through vocal cords under direct vision,  positive ETCO2 and breath sounds checked- equal and bilateral Secured at: 21 cm Tube secured with: Tape Dental Injury: Teeth and Oropharynx as per pre-operative assessment

## 2018-02-11 NOTE — Anesthesia Preprocedure Evaluation (Addendum)
Anesthesia Evaluation  Patient identified by MRN, date of birth, ID band Patient awake    Reviewed: Allergy & Precautions, NPO status , Patient's Chart, lab work & pertinent test results  Airway Mallampati: I  TM Distance: >3 FB Neck ROM: Full    Dental  (+) Chipped,    Pulmonary asthma (childhood) ,    Pulmonary exam normal breath sounds clear to auscultation       Cardiovascular negative cardio ROS Normal cardiovascular exam Rhythm:Regular Rate:Normal     Neuro/Psych  Headaches, Anxiety    GI/Hepatic negative GI ROS, Neg liver ROS,   Endo/Other  Morbid obesityPCOS (polycystic ovarian syndrome) Super obese  Renal/GU negative Renal ROS     Musculoskeletal negative musculoskeletal ROS (+)   Abdominal (+) + obese,   Peds  Hematology negative hematology ROS (+)   Anesthesia Other Findings Right Ovarian Cyst  Reproductive/Obstetrics hcg negative                            Anesthesia Physical Anesthesia Plan  ASA: IV  Anesthesia Plan: General   Post-op Pain Management:    Induction: Intravenous  PONV Risk Score and Plan: 4 or greater and Scopolamine patch - Pre-op, Midazolam, Dexamethasone and Ondansetron  Airway Management Planned: Oral ETT  Additional Equipment:   Intra-op Plan:   Post-operative Plan: Extubation in OR  Informed Consent: I have reviewed the patients History and Physical, chart, labs and discussed the procedure including the risks, benefits and alternatives for the proposed anesthesia with the patient or authorized representative who has indicated his/her understanding and acceptance.   Dental advisory given  Plan Discussed with: CRNA  Anesthesia Plan Comments:        Anesthesia Quick Evaluation

## 2018-02-12 ENCOUNTER — Encounter (HOSPITAL_COMMUNITY): Payer: Self-pay | Admitting: Obstetrics and Gynecology

## 2018-02-14 ENCOUNTER — Encounter: Payer: Self-pay | Admitting: Obstetrics and Gynecology

## 2018-02-24 ENCOUNTER — Ambulatory Visit: Payer: Self-pay | Admitting: Obstetrics and Gynecology

## 2018-02-26 ENCOUNTER — Encounter: Payer: Self-pay | Admitting: Obstetrics and Gynecology

## 2018-02-26 ENCOUNTER — Ambulatory Visit (INDEPENDENT_AMBULATORY_CARE_PROVIDER_SITE_OTHER): Payer: BLUE CROSS/BLUE SHIELD | Admitting: Obstetrics and Gynecology

## 2018-02-26 VITALS — BP 138/74 | HR 99 | Ht 65.0 in | Wt 329.3 lb

## 2018-02-26 DIAGNOSIS — Z9889 Other specified postprocedural states: Secondary | ICD-10-CM

## 2018-02-26 NOTE — Progress Notes (Signed)
   GYNECOLOGY OFFICE FOLLOW UP NOTE  History:  22 y.o. G0P0000 here today for follow up for laparoscopic left salpingectomy with cyst removal. She is feeling well, has no complaints. Mild pain after surgery but generally recovering well. Eating, drinking normally, stooling without issue. Minimal pain now.  Past Medical History:  Diagnosis Date  . Anxiety    no meds  . Asthma    as a child, no inhaler, no problems as adult  . Migraine    otc med prn  . PCOS (polycystic ovarian syndrome)     Past Surgical History:  Procedure Laterality Date  . LAPAROSCOPIC OVARIAN CYSTECTOMY N/A 02/11/2018   Procedure: LAPAROSCOPIC OVARIAN CYSTECTOMY;  Surgeon: Sloan Leiter, MD;  Location: [facility];  Service: Gynecology;  Laterality: N/A;  . WISDOM TOOTH EXTRACTION       Current Outpatient Medications:  .  docusate sodium (COLACE) 100 MG capsule, Take 1 capsule (100 mg total) by mouth daily., Disp: 30 capsule, Rfl: 2 .  etonogestrel (NEXPLANON) 68 MG IMPL implant, 1 each by Subdermal route once., Disp: , Rfl:  .  ibuprofen (ADVIL,MOTRIN) 600 MG tablet, Take 1 tablet (600 mg total) by mouth every 6 (six) hours as needed., Disp: 30 tablet, Rfl: 1 .  oxyCODONE-acetaminophen (PERCOCET/ROXICET) 5-325 MG tablet, Take 1 tablet by mouth every 6 (six) hours as needed., Disp: 20 tablet, Rfl: 0 .  topiramate (TOPAMAX) 25 MG tablet, Take 1 tablet at bedtime (Patient not taking: Reported on 01/28/2018), Disp: 30 tablet, Rfl: 6  The following portions of the patient's history were reviewed and updated as appropriate: allergies, current medications, past family history, past medical history, past social history, past surgical history and problem list.   Review of Systems:  Pertinent items noted in HPI and remainder of comprehensive ROS otherwise negative.   Objective:  Physical Exam BP 138/74   Pulse 99   Ht 5\' 5"  (1.651 m)   Wt (!) 329 lb 4.8 oz (149.4 kg)   LMP  (LMP Unknown) Comment: May 2019  BMI 54.80  kg/m  CONSTITUTIONAL: Well-developed, well-nourished female in no acute distress.  HENT:  Normocephalic, atraumatic. External right and left ear normal. Oropharynx is clear and moist EYES: Conjunctivae and EOM are normal. Pupils are equal, round, and reactive to light. No scleral icterus.  NECK: Normal range of motion, supple, no masses SKIN: Skin is warm and dry. No rash noted. Not diaphoretic. No erythema. No pallor. NEUROLOGIC: Alert and oriented to person, place, and time. Normal reflexes, muscle tone coordination. No cranial nerve deficit noted. PSYCHIATRIC: Normal mood and affect. Normal behavior. Normal judgment and thought content. CARDIOVASCULAR: Normal heart rate noted RESPIRATORY: Effort and breath sounds normal, no problems with respiration noted ABDOMEN: Soft, no distention noted.  Port sites appear well approximated and intact, no erythema or signs of infection, small amount of suture removed from right sided port site PELVIC: Deferred MUSCULOSKELETAL: Normal range of motion. No edema noted.  Labs and Imaging No results found.  Assessment & Plan:  1. Post-operative state Doing well, no complaints Small suture removed F/u as needed   Routine preventative health maintenance measures emphasized. Please refer to After Visit Summary for other counseling recommendations.   Return in about 1 year (around 02/27/2019), or if symptoms worsen or fail to improve.  Feliz Beam, M.D. Attending [place], [facility] for [employer], [place]

## 2018-02-26 NOTE — Progress Notes (Signed)
Presents for Post Op FU.  No problems.

## 2018-03-31 ENCOUNTER — Ambulatory Visit: Payer: BLUE CROSS/BLUE SHIELD | Admitting: Family Medicine

## 2018-04-18 ENCOUNTER — Ambulatory Visit (INDEPENDENT_AMBULATORY_CARE_PROVIDER_SITE_OTHER): Payer: Self-pay | Admitting: Family Medicine

## 2018-04-18 ENCOUNTER — Encounter: Payer: Self-pay | Admitting: Family Medicine

## 2018-04-18 VITALS — BP 125/71 | HR 93 | Temp 98.8°F | Resp 16 | Ht 65.0 in | Wt 330.0 lb

## 2018-04-18 DIAGNOSIS — E282 Polycystic ovarian syndrome: Secondary | ICD-10-CM

## 2018-04-18 DIAGNOSIS — F321 Major depressive disorder, single episode, moderate: Secondary | ICD-10-CM

## 2018-04-18 MED ORDER — METFORMIN HCL 500 MG PO TABS: 500.0000 mg | ORAL_TABLET | Freq: Every day | ORAL | 1 refills | Status: DC

## 2018-04-18 MED ORDER — SERTRALINE HCL 25 MG PO TABS: 25.0000 mg | ORAL_TABLET | Freq: Every day | ORAL | 0 refills | Status: DC

## 2018-04-18 NOTE — Patient Instructions (Signed)
I am starting you on a new medication in the SSRI/SNRI family for your depression/anxiety. Take this medication daily as it has been prescribed. You may experience gastrointestinal upset. This usually will stop after you are on the medication for a few days. If you have feelings of euphoria (happy and high), stop the medication immediately and go to the nearest emergency department. If you have suicidal or homicidal thoughts, delusions or hallucinations, stop the medication immediately and go to the nearest emergency department. I would like to see you again in 2 weeks.     Major Depressive Disorder, Adult Major depressive disorder (MDD) is a mental health condition. MDD often makes you feel sad, hopeless, or helpless. MDD can also cause symptoms in your body. MDD can affect your:  Work.  School.  Relationships.  Other normal activities.  MDD can range from mild to very bad. It may occur once (single episode MDD). It can also occur many times (recurrent MDD). The main symptoms of MDD often include:  Feeling sad, depressed, or irritable most of the time.  Loss of interest.  MDD symptoms also include:  Sleeping too much or too little.  Eating too much or too little.  A change in your weight.  Feeling tired (fatigue) or having low energy.  Feeling worthless.  Feeling guilty.  Trouble making decisions.  Trouble thinking clearly.  Thoughts of suicide or harming others.  Feeling weak.  Feeling agitated.  Keeping yourself from being around other people (isolation).  Follow these instructions at home: Activity  Do these things as told by your doctor: ? Go back to your normal activities. ? Exercise regularly. ? Spend time outdoors. Alcohol  Talk with your doctor about how alcohol can affect your antidepressant medicines.  Do not drink alcohol. Or, limit how much alcohol you drink. ? This means no more than 1 drink a day for nonpregnant women and 2 drinks a day for  men. One drink equals one of these:  12 oz of beer.  5 oz of wine.  1 oz of hard liquor. General instructions  Take over-the-counter and prescription medicines only as told by your doctor.  Eat a healthy diet.  Get plenty of sleep.  Find activities that you enjoy. Make time to do them.  Think about joining a support group. Your doctor may be able to suggest a group for you.  Keep all follow-up visits as told by your doctor. This is important. Where to find more information:  [employer] on Mental Illness: ? www.nami.Johnson: ? https://carter.com/  National Suicide Prevention Lifeline: ? 530-652-8439. This is free, 24-hour help. Contact a doctor if:  Your symptoms get worse.  You have new symptoms. Get help right away if:  You self-harm.  You see, hear, taste, smell, or feel things that are not present (hallucinate). If you ever feel like you may hurt yourself or others, or have thoughts about taking your own life, get help right away. You can go to your nearest emergency department or call:  Your local emergency services (911 in the [place]  A suicide crisis helpline, such as the National Suicide Prevention Lifeline: ? (215) 404-7031. This is open 24 hours a day.  This information is not intended to replace advice given to you by your health care provider. Make sure you discuss any questions you have with your health care provider. Document Released: 08/15/2015 Document Revised: 05/20/2016 Document Reviewed: 05/20/2016 Elsevier Interactive Patient Education  2017 Elsevier  Inc. Sertraline tablets What is this medicine? SERTRALINE (SER tra leen) is used to treat depression. It may also be used to treat obsessive compulsive disorder, panic disorder, post-trauma stress, premenstrual dysphoric disorder (PMDD) or social anxiety. This medicine may be used for other purposes; ask your health care provider or pharmacist if you  have questions. COMMON BRAND NAME(S): Zoloft What should I tell my health care provider before I take this medicine? They need to know if you have any of these conditions: -bleeding disorders -bipolar disorder or a family history of bipolar disorder -glaucoma -heart disease -high blood pressure -history of irregular heartbeat -history of low levels of calcium, magnesium, or potassium in the blood -if you often drink alcohol -liver disease -receiving electroconvulsive therapy -seizures -suicidal thoughts, plans, or attempt; a previous suicide attempt by you or a family member -take medicines that treat or prevent blood clots -thyroid disease -an unusual or allergic reaction to sertraline, other medicines, foods, dyes, or preservatives -pregnant or trying to get pregnant -breast-feeding How should I use this medicine? Take this medicine by mouth with a glass of water. Follow the directions on the prescription label. You can take it with or without food. Take your medicine at regular intervals. Do not take your medicine more often than directed. Do not stop taking this medicine suddenly except upon the advice of your doctor. Stopping this medicine too quickly may cause serious side effects or your condition may worsen. A special MedGuide will be given to you by the pharmacist with each prescription and refill. Be sure to read this information carefully each time. Talk to your pediatrician regarding the use of this medicine in children. While this drug may be prescribed for children as young as 7 years for selected conditions, precautions do apply. Overdosage: If you think you have taken too much of this medicine contact a poison control center or emergency room at once. NOTE: This medicine is only for you. Do not share this medicine with others. What if I miss a dose? If you miss a dose, take it as soon as you can. If it is almost time for your next dose, take only that dose. Do not take  double or extra doses. What may interact with this medicine? Do not take this medicine with any of the following medications: -cisapride -dofetilide -dronedarone -linezolid -MAOIs like Carbex, Eldepryl, Marplan, Nardil, and Parnate -methylene blue (injected into a vein) -pimozide -thioridazine This medicine may also interact with the following medications: -alcohol -amphetamines -aspirin and aspirin-like medicines -certain medicines for depression, anxiety, or psychotic disturbances -certain medicines for fungal infections like ketoconazole, fluconazole, posaconazole, and itraconazole -certain medicines for irregular heart beat like flecainide, quinidine, propafenone -certain medicines for migraine headaches like almotriptan, eletriptan, frovatriptan, naratriptan, rizatriptan, sumatriptan, zolmitriptan -certain medicines for sleep -certain medicines for seizures like carbamazepine, valproic acid, phenytoin -certain medicines that treat or prevent blood clots like warfarin, enoxaparin, dalteparin -cimetidine -digoxin -diuretics -fentanyl -isoniazid -lithium -NSAIDs, medicines for pain and inflammation, like ibuprofen or naproxen -other medicines that prolong the QT interval (cause an abnormal heart rhythm) -rasagiline -safinamide -supplements like St. John's wort, kava kava, valerian -tolbutamide -tramadol -tryptophan This list may not describe all possible interactions. Give your health care provider a list of all the medicines, herbs, non-prescription drugs, or dietary supplements you use. Also tell them if you smoke, drink alcohol, or use illegal drugs. Some items may interact with your medicine. What should I watch for while using this medicine? Tell your doctor if your  symptoms do not get better or if they get worse. Visit your doctor or health care professional for regular checks on your progress. Because it may take several weeks to see the full effects of this medicine,  it is important to continue your treatment as prescribed by your doctor. Patients and their families should watch out for new or worsening thoughts of suicide or depression. Also watch out for sudden changes in feelings such as feeling anxious, agitated, panicky, irritable, hostile, aggressive, impulsive, severely restless, overly excited and hyperactive, or not being able to sleep. If this happens, especially at the beginning of treatment or after a change in dose, call your health care professional. Dennis Bast may get drowsy or dizzy. Do not drive, use machinery, or do anything that needs mental alertness until you know how this medicine affects you. Do not stand or sit up quickly, especially if you are an older patient. This reduces the risk of dizzy or fainting spells. Alcohol may interfere with the effect of this medicine. Avoid alcoholic drinks. Your mouth may get dry. Chewing sugarless gum or sucking hard candy, and drinking plenty of water may help. Contact your doctor if the problem does not go away or is severe. What side effects may I notice from receiving this medicine? Side effects that you should report to your doctor or health care professional as soon as possible: -allergic reactions like skin rash, itching or hives, swelling of the face, lips, or tongue -anxious -black, tarry stools -changes in vision -confusion -elevated mood, decreased need for sleep, racing thoughts, impulsive behavior -eye pain -fast, irregular heartbeat -feeling faint or lightheaded, falls -feeling agitated, angry, or irritable -hallucination, loss of contact with reality -loss of balance or coordination -loss of memory -painful or prolonged erections -restlessness, pacing, inability to keep still -seizures -stiff muscles -suicidal thoughts or other mood changes -trouble sleeping -unusual bleeding or bruising -unusually weak or tired -vomiting Side effects that usually do not require medical attention  (report to your doctor or health care professional if they continue or are bothersome): -change in appetite or weight -change in sex drive or performance -diarrhea -increased sweating -indigestion, nausea -tremors This list may not describe all possible side effects. Call your doctor for medical advice about side effects. You may report side effects to FDA at 1-800-FDA-1088. Where should I keep my medicine? Keep out of the reach of children. Store at room temperature between 15 and 30 degrees C (59 and 86 degrees F). Throw away any unused medicine after the expiration date. NOTE: This sheet is a summary. It may not cover all possible information. If you have questions about this medicine, talk to your doctor, pharmacist, or health care provider.  2018 Elsevier/Gold Standard (2016-09-07 14:17:49)
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
## 2018-05-02 ENCOUNTER — Ambulatory Visit (INDEPENDENT_AMBULATORY_CARE_PROVIDER_SITE_OTHER): Payer: Self-pay | Admitting: Family Medicine

## 2018-05-02 ENCOUNTER — Encounter: Payer: Self-pay | Admitting: Family Medicine

## 2018-05-02 VITALS — BP 149/87 | HR 109 | Temp 99.2°F | Resp 16 | Ht 65.0 in | Wt 332.0 lb

## 2018-05-02 DIAGNOSIS — F411 Generalized anxiety disorder: Secondary | ICD-10-CM

## 2018-05-02 MED ORDER — SERTRALINE HCL 25 MG PO TABS: 25.0000 mg | ORAL_TABLET | Freq: Every day | ORAL | 0 refills | Status: DC
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
## 2018-05-02 NOTE — Patient Instructions (Signed)
Depression Screening Depression screening is a tool that your health care provider can use to learn if you have symptoms of depression. Depression is a common condition with many symptoms that are also often found in other conditions. Depression is treatable, but it must first be diagnosed. You may not know that certain feelings, thoughts, and behaviors that you are having can be symptoms of depression. Taking a depression screening test can help you and your health care provider decide if you need more assessment, or if you should be referred to a mental health care provider. What are the screening tests?  You may have a physical exam to see if another condition is affecting your mental health. You may have a blood or urine sample taken during the physical exam.  You may be interviewed using a screening tool that was developed from research, such as one of these: ? Patient Health Questionnaire (PHQ). This is a set of either 2 or 9 questions. A health care provider who has been trained to score this screening test uses a guide to assess if your symptoms suggest that you may have depression. ? Hamilton Depression Rating Scale (HAM-D). This is a set of either 17 or 24 questions. You may be asked to take it again during or after your treatment, to see if your depression has gotten better. ? Beck Depression Inventory (BDI). This is a set of 21 multiple choice questions. Your health care provider scores your answers to assess:  Your level of depression, ranging from mild to severe.  Your response to treatment.  Your health care provider may talk with you about your daily activities, such as eating, sleeping, work, and recreation, and ask if you have had any changes in activity.  Your health care provider may ask you to see a mental health specialist, such as a psychiatrist or psychologist, for more evaluation. Who should be screened for depression?  All adults, including adults with a family history  of a mental health disorder.  Adolescents who are 12-18 years old.  People who are recovering from a myocardial infarction (MI).  Pregnant women, or women who have given birth.  People who have a long-term (chronic) illness.  Anyone who has been diagnosed with another type of a mental health disorder.  Anyone who has symptoms that could show depression. What do my results mean? Your health care provider will review the results of your depression screening, physical exam, and lab tests. Positive screens suggest that you may have depression. Screening is the first step in getting the care that you may need. It is up to you to get your screening results. Ask your health care provider, or the department that is doing your screening tests, when your results will be ready. Talk with your health care provider about your results and diagnosis. A diagnosis of depression is made using the Diagnostic and Statistical Manual of Mental Disorders (DSM-V). This is a book that lists the number and type of symptoms that must be present for a health care provider to give a specific diagnosis.  Your health care provider may work with you to treat your symptoms of depression, or your health care provider may help you find a mental health provider who can assess, diagnose, and treat your depression. Get help right away if:  You have thoughts about hurting yourself or others. If you ever feel like you may hurt yourself or others, or have thoughts about taking your own life, get help right away. You can   go to your nearest emergency department or call:  Your local emergency services (911 in the [place]  A suicide crisis helpline, such as the National Suicide Prevention Lifeline at 1-800-273-8255. This is open 24 hours a day.  Summary  Depression screening is the first step in getting the help that you may need.  If your screening test shows symptoms of depression (is positive), your health care provider may ask  you to see a mental health provider.  Anyone who is age 12 or older should be screened for depression. This information is not intended to replace advice given to you by your health care provider. Make sure you discuss any questions you have with your health care provider. Document Released: 01/18/2017 Document Revised: 01/18/2017 Document Reviewed: 01/18/2017 Elsevier Interactive Patient Education  2018 Elsevier Inc.  

## 2018-05-22 ENCOUNTER — Other Ambulatory Visit: Payer: Self-pay

## 2018-05-22 DIAGNOSIS — F411 Generalized anxiety disorder: Secondary | ICD-10-CM

## 2018-05-22 MED ORDER — SERTRALINE HCL 25 MG PO TABS
25.0000 mg | ORAL_TABLET | Freq: Every day | ORAL | 0 refills | Status: DC
Start: 2018-05-22 — End: 2018-06-26

## 2018-06-24 ENCOUNTER — Telehealth: Payer: Self-pay

## 2018-06-24 NOTE — Telephone Encounter (Signed)
Called, no answer. Left a message to remind patient of appointment on 06/26/2018. Thanks!

## 2018-06-26 ENCOUNTER — Ambulatory Visit (INDEPENDENT_AMBULATORY_CARE_PROVIDER_SITE_OTHER): Payer: Self-pay | Admitting: Family Medicine

## 2018-06-26 VITALS — BP 129/70 | HR 75 | Temp 98.2°F | Resp 16 | Ht 65.0 in | Wt 339.0 lb

## 2018-06-26 DIAGNOSIS — G43709 Chronic migraine without aura, not intractable, without status migrainosus: Secondary | ICD-10-CM

## 2018-06-26 DIAGNOSIS — L700 Acne vulgaris: Secondary | ICD-10-CM

## 2018-06-26 DIAGNOSIS — F411 Generalized anxiety disorder: Secondary | ICD-10-CM

## 2018-06-26 MED ORDER — TOPIRAMATE 25 MG PO TABS: ORAL_TABLET | ORAL | 5 refills | Status: DC

## 2018-06-26 MED ORDER — ADAPALENE 0.1 % EX CREA: TOPICAL_CREAM | Freq: Every day | CUTANEOUS | 2 refills | Status: DC

## 2018-06-26 MED ORDER — SERTRALINE HCL 25 MG PO TABS: 25.0000 mg | ORAL_TABLET | Freq: Every day | ORAL | 2 refills | Status: DC

## 2018-06-26 NOTE — Progress Notes (Signed)
  [facility]   Progress Note: General Provider: Lanae Boast, FNP  SUBJECTIVE:   Chloe Pena is a 22 y.o. female who  has a past medical history of Anxiety, Asthma, Migraine, and PCOS (polycystic ovarian syndrome).. 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  Review of Systems  Psychiatric/Behavioral: Positive for depression (improving). Negative for hallucinations, memory loss, substance abuse and suicidal ideas. The patient is nervous/anxious. The patient does not have insomnia.   All other systems reviewed and are negative.    OBJECTIVE: BP 129/70 (BP Location: Left Arm, Patient Position: Sitting, Cuff Size: Large)   Pulse 75   Temp 98.2 F (36.8 C) (Oral)   Resp 16   Ht 5\' 5"  (1.651 m)   Wt (!) 339 lb (153.8 kg)   SpO2 100%   BMI 56.41 kg/m   Physical Exam  Constitutional: She is oriented to person, place, and time. She appears well-developed and well-nourished. No distress.  HENT:  Head: Normocephalic and atraumatic.  Eyes: EOM are normal.  Neck: Normal range of motion.  Cardiovascular: Normal rate.  Pulmonary/Chest: Effort normal.  Musculoskeletal: Normal range of motion.  Neurological: She is alert and oriented to person, place, and time.  Skin:  Comedones noted to bilateral cheeks and forehead. Mildly erythematous.   Psychiatric: She has a normal mood and affect. Her behavior is normal. Judgment and thought content normal.  Nursing note and vitals reviewed.   ASSESSMENT/PLAN:  1. Headache - topiramate (TOPAMAX) 25 MG tablet; Take 1 tablet at bedtime  Dispense: 30 tablet; Refill: 5  2. Generalized anxiety disorder Offered vistaril or propranolol for intermittent anxiety. Patient declined - sertraline (ZOLOFT) 25 MG  tablet; Take 1 tablet (25 mg total) by mouth daily.  Dispense: 30 tablet; Refill: 2  3. Acne vulgaris - adapalene (DIFFERIN) 0.1 % cream; Apply topically at bedtime.  Dispense: 45 g; Refill: 2      The patient was given clear instructions to go to ER or return to medical center if symptoms do not improve, worsen or new problems develop. The patient verbalized understanding and agreed with plan of care.   [name]. Chloe Canary, FNP-BC [facility] [address] [place], [place] [address] [phone]     This note has been created with Dragon speech recognition software and smart phrase technology. Any transcriptional errors are unintentional.

## 2018-06-26 NOTE — Patient Instructions (Signed)
Topiramate tablets What is this medicine? TOPIRAMATE (toe PYRE a mate) is used to treat seizures in adults or children with epilepsy. It is also used for the prevention of migraine headaches. This medicine may be used for other purposes; ask your health care provider or pharmacist if you have questions. COMMON BRAND NAME(S): Topamax, Topiragen What should I tell my health care provider before I take this medicine? They need to know if you have any of these conditions: -bleeding disorders -cirrhosis of the liver or liver disease -diarrhea -glaucoma -kidney stones or kidney disease -low blood counts, like low white cell, platelet, or red cell counts -lung disease like asthma, obstructive pulmonary disease, emphysema -metabolic acidosis -on a ketogenic diet -schedule for surgery or a procedure -suicidal thoughts, plans, or attempt; a previous suicide attempt by you or a family member -an unusual or allergic reaction to topiramate, other medicines, foods, dyes, or preservatives -pregnant or trying to get pregnant -breast-feeding How should I use this medicine? Take this medicine by mouth with a glass of water. Follow the directions on the prescription label. Do not crush or chew. You may take this medicine with meals. Take your medicine at regular intervals. Do not take it more often than directed. Talk to your pediatrician regarding the use of this medicine in children. Special care may be needed. While this drug may be prescribed for children as young as 86 years of age for selected conditions, precautions do apply. Overdosage: If you think you have taken too much of this medicine contact a poison control center or emergency room at once. NOTE: This medicine is only for you. Do not share this medicine with others. What if I miss a dose? If you miss a dose, take it as soon as you can. If your next dose is to be taken in less than 6 hours, then do not take the missed dose. Take the next dose at  your regular time. Do not take double or extra doses. What may interact with this medicine? Do not take this medicine with any of the following medications: -probenecid This medicine may also interact with the following medications: -acetazolamide -alcohol -amitriptyline -aspirin and aspirin-like medicines -birth control pills -certain medicines for depression -certain medicines for seizures -certain medicines that treat or prevent blood clots like warfarin, enoxaparin, dalteparin, apixaban, dabigatran, and rivaroxaban -digoxin -hydrochlorothiazide -lithium -medicines for pain, sleep, or muscle relaxation -metformin -methazolamide -NSAIDS, medicines for pain and inflammation, like ibuprofen or naproxen -pioglitazone -risperidone This list may not describe all possible interactions. Give your health care provider a list of all the medicines, herbs, non-prescription drugs, or dietary supplements you use. Also tell them if you smoke, drink alcohol, or use illegal drugs. Some items may interact with your medicine. What should I watch for while using this medicine? Visit your doctor or health care professional for regular checks on your progress. Do not stop taking this medicine suddenly. This increases the risk of seizures if you are using this medicine to control epilepsy. Wear a medical identification bracelet or chain to say you have epilepsy or seizures, and carry a card that lists all your medicines. This medicine can decrease sweating and increase your body temperature. Watch for signs of deceased sweating or fever, especially in children. Avoid extreme heat, hot baths, and saunas. Be careful about exercising, especially in hot weather. Contact your health care provider right away if you notice a fever or decrease in sweating. You should drink plenty of fluids while taking this medicine.  If you have had kidney stones in the past, this will help to reduce your chances of forming kidney  stones. If you have stomach pain, with nausea or vomiting and yellowing of your eyes or skin, call your doctor immediately. You may get drowsy, dizzy, or have blurred vision. Do not drive, use machinery, or do anything that needs mental alertness until you know how this medicine affects you. To reduce dizziness, do not sit or stand up quickly, especially if you are an older patient. Alcohol can increase drowsiness and dizziness. Avoid alcoholic drinks. If you notice blurred vision, eye pain, or other eye problems, seek medical attention at once for an eye exam. The use of this medicine may increase the chance of suicidal thoughts or actions. Pay special attention to how you are responding while on this medicine. Any worsening of mood, or thoughts of suicide or dying should be reported to your health care professional right away. This medicine may increase the chance of developing metabolic acidosis. If left untreated, this can cause kidney stones, bone disease, or slowed growth in children. Symptoms include breathing fast, fatigue, loss of appetite, irregular heartbeat, or loss of consciousness. Call your doctor immediately if you experience any of these side effects. Also, tell your doctor about any surgery you plan on having while taking this medicine since this may increase your risk for metabolic acidosis. Birth control pills may not work properly while you are taking this medicine. Talk to your doctor about using an extra method of birth control. Women who become pregnant while using this medicine may enroll in the [place] Pregnancy Registry by calling [phone]. This registry collects information about the safety of antiepileptic drug use during pregnancy. What side effects may I notice from receiving this medicine? Side effects that you should report to your doctor or health care professional as soon as possible: -allergic reactions like skin rash, itching or hives,  swelling of the face, lips, or tongue -decreased sweating and/or rise in body temperature -depression -difficulty breathing, fast or irregular breathing patterns -difficulty speaking -difficulty walking or controlling muscle movements -hearing impairment -redness, blistering, peeling or loosening of the skin, including inside the mouth -tingling, pain or numbness in the hands or feet -unusual bleeding or bruising -unusually weak or tired -worsening of mood, thoughts or actions of suicide or dying Side effects that usually do not require medical attention (report to your doctor or health care professional if they continue or are bothersome): -altered taste -back pain, joint or muscle aches and pains -diarrhea, or constipation -headache -loss of appetite -nausea -stomach upset, indigestion -tremors This list may not describe all possible side effects. Call your doctor for medical advice about side effects. You may report side effects to FDA at 1-800-FDA-1088. Where should I keep my medicine? Keep out of the reach of children. Store at room temperature between 15 and 30 degrees C (59 and 86 degrees F) in a tightly closed container. Protect from moisture. Throw away any unused medicine after the expiration date. NOTE: This sheet is a summary. It may not cover all possible information. If you have questions about this medicine, talk to your doctor, pharmacist, or health care provider.  2018 Elsevier/Gold Standard (2013-09-07 23:17:57) Adapalene skin products What is this medicine? ADAPALENE (a DAP a leen) is applied to the skin to treat mild to moderate acne. This medicine may be used for other purposes; ask your health care provider or pharmacist if you have questions. COMMON BRAND NAME(S):  Differin, Differin Pump What should I tell my health care provider before I take this medicine? They need to know if you have any of these conditions: -eczema -seborrheic dermatitis -skin  abrasions -sunburn -an unusual or allergic reaction to adapalene, vitamin A, other medicines, foods, dyes, or preservatives -pregnant or trying to get pregnant -breast-feeding How should I use this medicine? This medicine is for external use only, do not take by mouth. Follow the directions on the prescription label. Make sure the skin is clean and dry. Apply just enough to cover the affected area. Rub in gently. Do not get in the eyes, inside the nose, on wounds, or any other sensitive areas of skin. Talk to your pediatrician regarding the use of this medicine in children. Special care may be needed. Overdosage: If you think you have taken too much of this medicine contact a poison control center or emergency room at once. NOTE: This medicine is only for you. Do not share this medicine with others. What if I miss a dose? If you miss a dose, skip that dose and continue with your regular schedule. Do not use extra doses, or use for a longer period of time than directed by your doctor or health care professional. What may interact with this medicine? -topical antibiotics like clindamycin or erythromycin This list may not describe all possible interactions. Give your health care provider a list of all the medicines, herbs, non-prescription drugs, or dietary supplements you use. Also tell them if you smoke, drink alcohol, or use illegal drugs. Some items may interact with your medicine. What should I watch for while using this medicine? Your acne may get worse at first, and then should start to get better. It may take 2 to 12 weeks before you see the full effect. Do not wash your face more than 3 times a day unless your doctor or health care professional tells you to. Do not use products that may dry the skin like medicated cosmetics, products that contain alcohol, or abrasive soaps or cleaners. Do not use other acne or skin treatment on the same area that you use this medicine unless your doctor or  health care professional tells you to. If you use these together they can cause severe skin irritation. This medicine can make you more sensitive to the sun. Keep out of the sun. If you cannot avoid being in the sun, wear protective clothing and use sunscreen. Do not use sun lamps or tanning beds/booths. What side effects may I notice from receiving this medicine? Side effects that you should report to your doctor or health care professional as soon as possible: -allergic reactions like skin rash, itching or hives, swelling of the face, lips, or tongue -severe burning, reddening, crusting, or swelling of the treated areas Side effects that usually do not require medical attention (report to your doctor or health care professional if they continue or are bothersome): -inflamed, stinging, and irritated skin -skin that peels after a few days of use This list may not describe all possible side effects. Call your doctor for medical advice about side effects. You may report side effects to FDA at 1-800-FDA-1088. Where should I keep my medicine? Keep out of the reach of children. Store at room temperature between 20 and 25 degrees C (68 and 77 degrees F). Do not freeze. Throw away any unused medicine after the expiration date. NOTE: This sheet is a summary. It may not cover all possible information. If you have questions about this  medicine, talk to your doctor, pharmacist, or health care provider.  2018 Elsevier/Gold Standard (2011-11-02 13:12:26)

## 2018-07-01 ENCOUNTER — Encounter: Payer: Self-pay | Admitting: Family Medicine

## 2018-07-02 ENCOUNTER — Ambulatory Visit: Payer: Self-pay | Admitting: Family Medicine

## 2018-08-25 IMAGING — CT CT ABD-PELV W/ CM
1 of 2 series · 15 of 32 positions shown, 19 images · IV contrast (OMNIPAQUE)
Comparison: None.

CLINICAL DATA: Right lower quadrant pain

EXAM:
CT ABDOMEN AND PELVIS WITH CONTRAST
TECHNIQUE: Multidetector CT imaging of the abdomen and pelvis was performed
using the standard protocol following bolus administration of
intravenous contrast.
CONTRAST:  100mL QGC0NR-III IOPAMIDOL (QGC0NR-III) INJECTION 61%

[Series 2: routine abdomen/pelvis with · axial · 0.89mm/px · z∈[+635,+1115]mm · 15 of 106 slices shown, 19 images]
[im 5/106  soft-tissue]
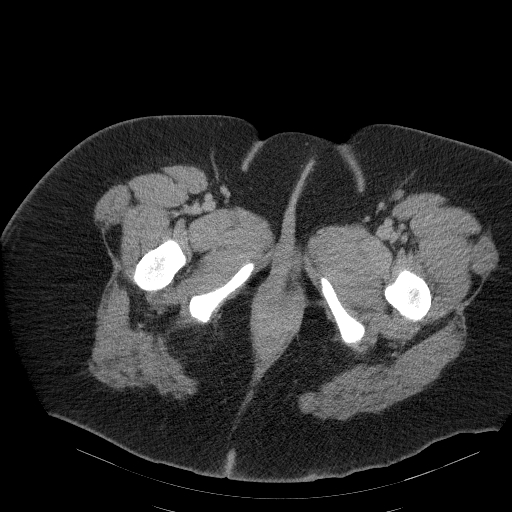
[im 5/106  bone]
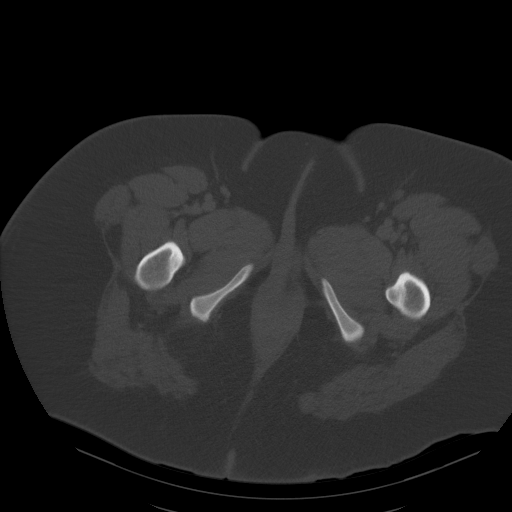
[im 14/106  soft-tissue]
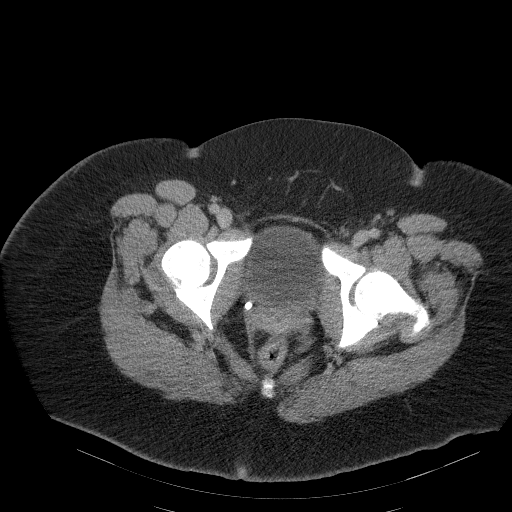
[im 22/106  soft-tissue]
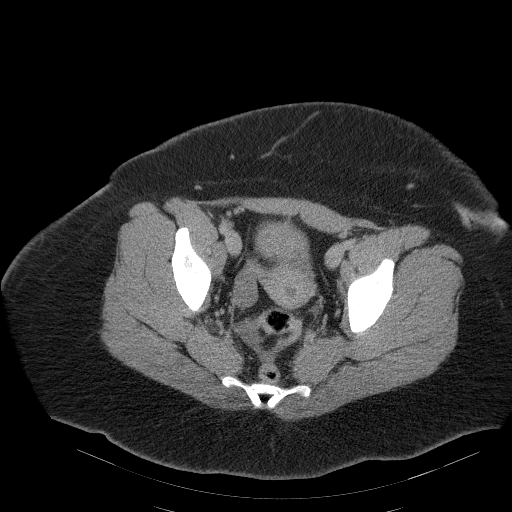
[im 31/106  soft-tissue]
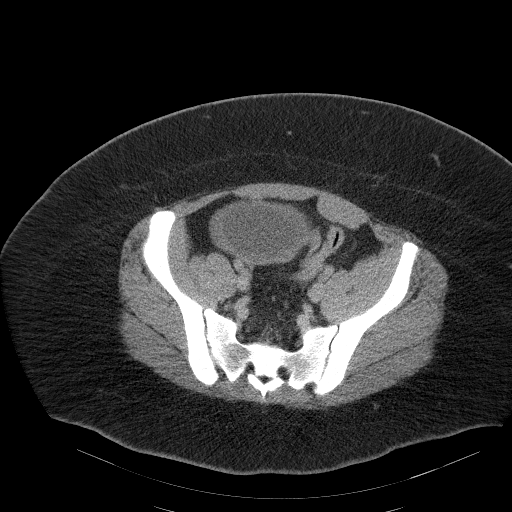
[im 36/106  soft-tissue]
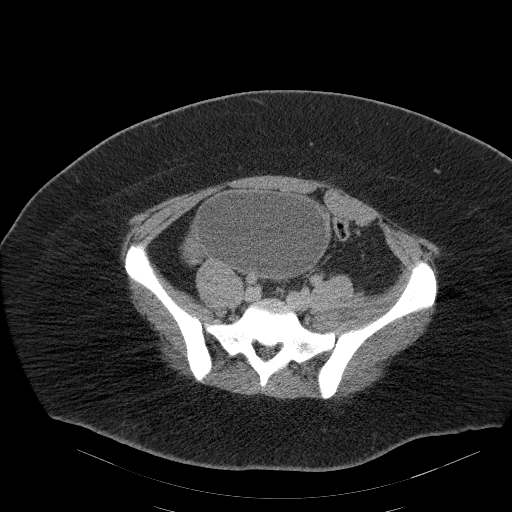
[im 44/106  soft-tissue]
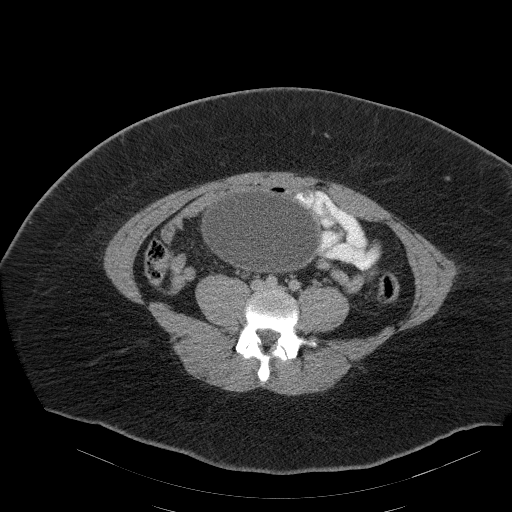
[im 53/106  soft-tissue]
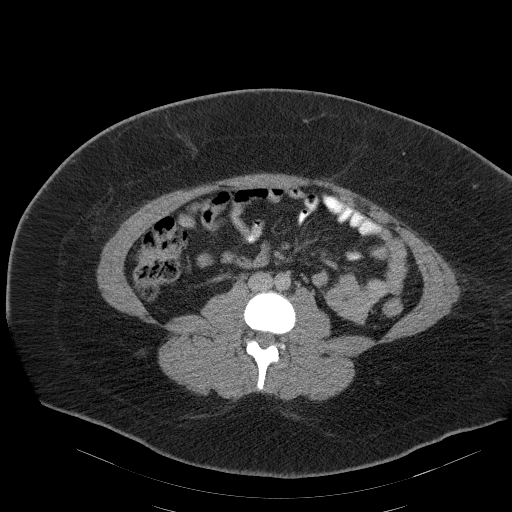
[im 62/106  soft-tissue]
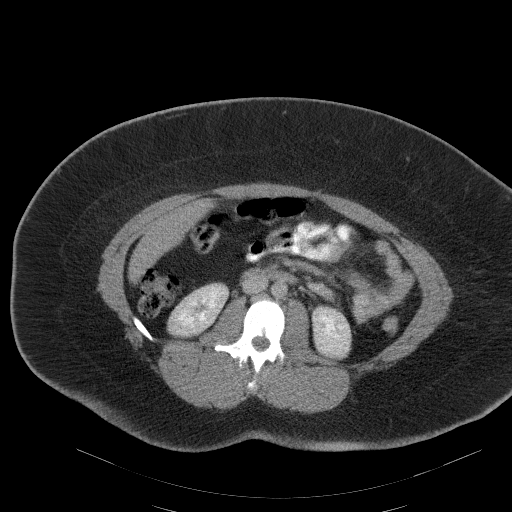
[im 71/106  soft-tissue]
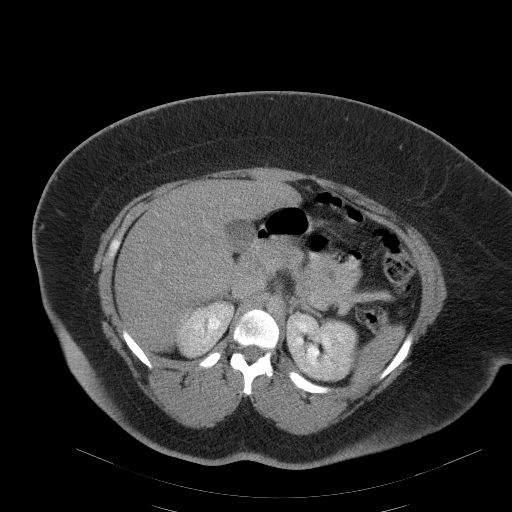
[im 71/106  bone]
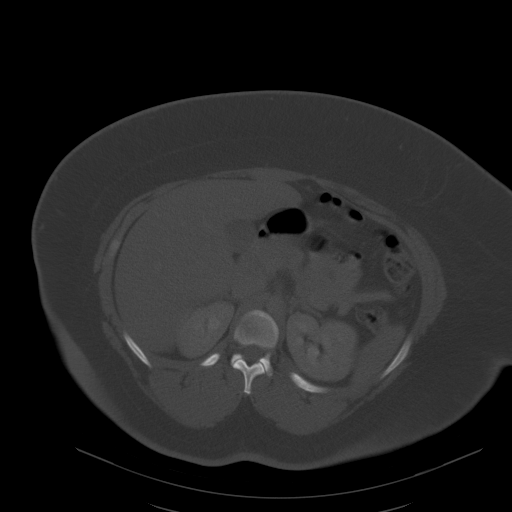
[im 75/106  soft-tissue]
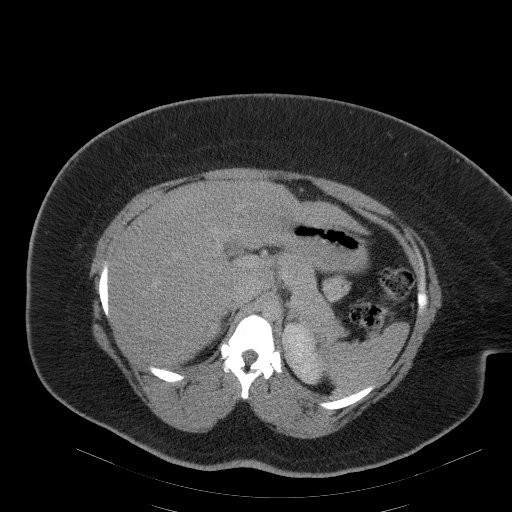
[im 84/106  soft-tissue]
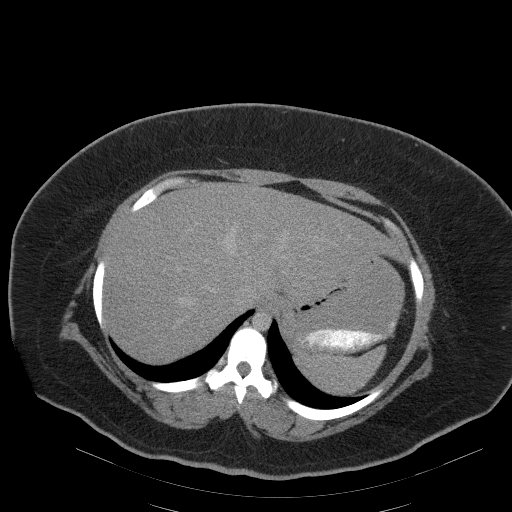
[im 88/106  lung]
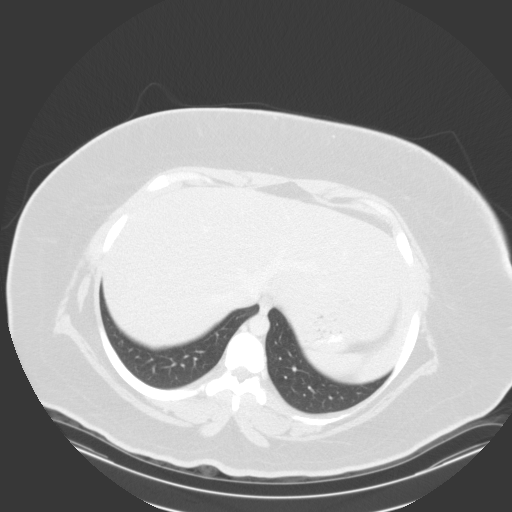
[im 92/106  soft-tissue]
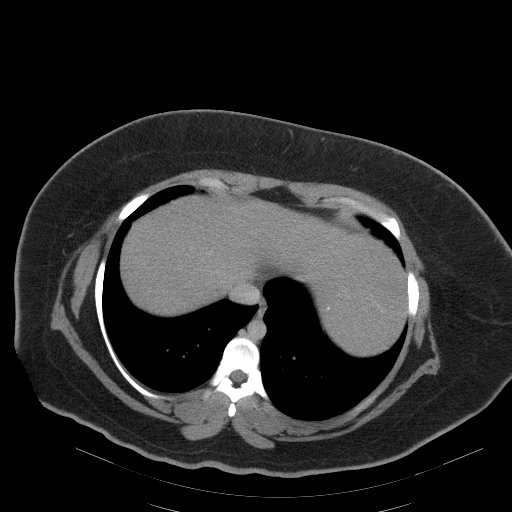
[im 92/106  lung]
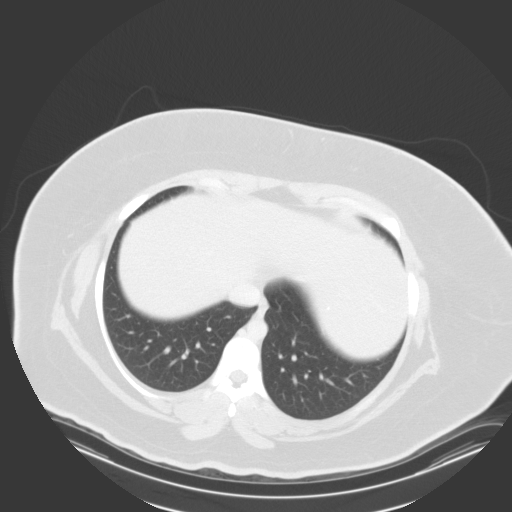
[im 97/106  lung]
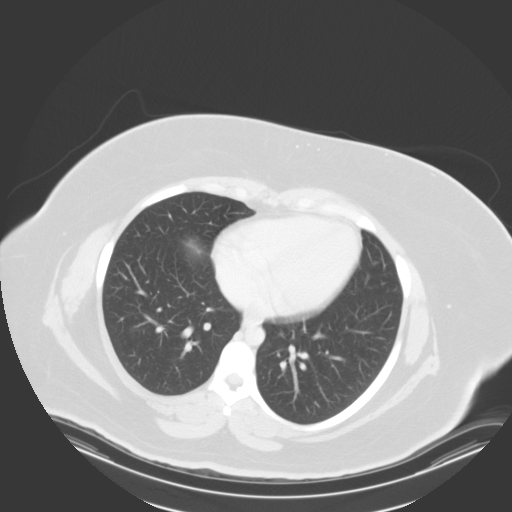
[im 101/106  soft-tissue]
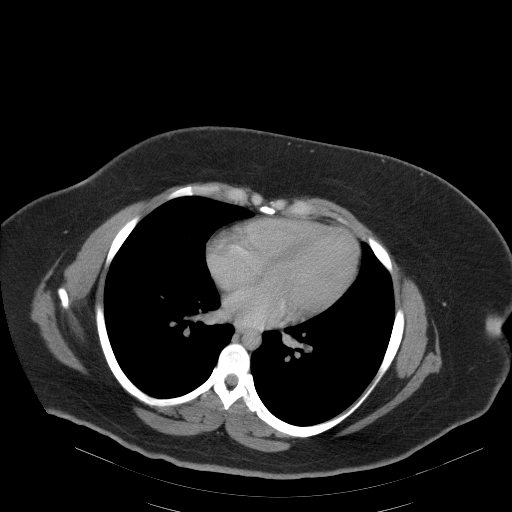
[im 101/106  lung]
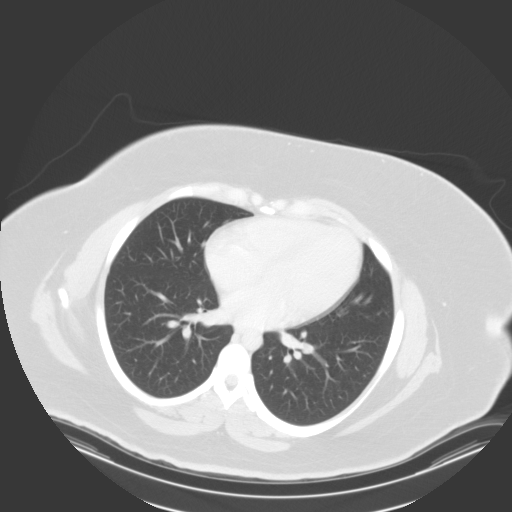

[15 of 32 positions shown; findings below may reference images not displayed]

FINDINGS: Lower chest: No basilar pulmonary nodules or pleural effusion. No
apical pericardial effusion.

Hepatobiliary: Normal hepatic contours and density. No intra- or
extrahepatic biliary dilatation. Normal gallbladder.

Pancreas: Normal parenchymal contours without ductal dilatation. No
peripancreatic fluid collection.

Spleen: Normal.

Adrenals/Urinary Tract:

--Adrenal glands: Normal.

--Right kidney/ureter: No hydronephrosis, nephroureterolithiasis,
perinephric stranding or solid renal mass.

--Left kidney/ureter: No hydronephrosis, nephroureterolithiasis,
perinephric stranding or solid renal mass.

--Urinary bladder: Normal for degree of distention

Stomach/Bowel:

--Stomach/Duodenum: No hiatal hernia or other gastric abnormality.
Normal duodenal course.

--Small bowel: No dilatation or inflammation.

--Colon: No focal abnormality.

--Appendix: Normal.

Vascular/Lymphatic: Normal course and caliber of the major abdominal
vessels. No abdominal or pelvic lymphadenopathy.

Reproductive: Normal appearance of the uterus. The right ovary is
normal. The left ovary is deviated toward the midline and is
enlarged, measuring 6.4 x 4.9 cm. Continuous with the ovary is a
massive cystic structure that measures 12.4 x 7.9 cm..

Musculoskeletal. No bony spinal canal stenosis or focal osseous
abnormality.

Other: None.
IMPRESSION: 1. Enlarged left ovary with associated massive cystic structure
measuring up to 12.4 cm. MRI of the pelvis with and without contrast
is recommended for more complete characterization of internal
architecture and to evaluate for ovarian neoplasm.
2. Normal appendix.  No acute abdominal abnormality.

## 2018-08-25 IMAGING — US US ART/VEN ABD/PELV/SCROTUM DOPPLER LTD
1 series · 15 of 25 positions shown · non-contrast
Comparison: None.

CLINICAL DATA: Abdominal pain.  Evaluate for ovarian torsion.

EXAM:
TRANSABDOMINAL ULTRASOUND OF PELVIS
DOPPLER ULTRASOUND OF OVARIES
TECHNIQUE: Transabdominal ultrasound examination of the pelvis was performed
including evaluation of the uterus, ovaries, adnexal regions, and
pelvic cul-de-sac.
Color and duplex Doppler ultrasound was utilized to evaluate blood
flow to the ovaries.

[Series 1: us art/ven abd/pelv/scrotum doppler ltd · 38 acquisitions, 15 frames shown]
[im 1/38]
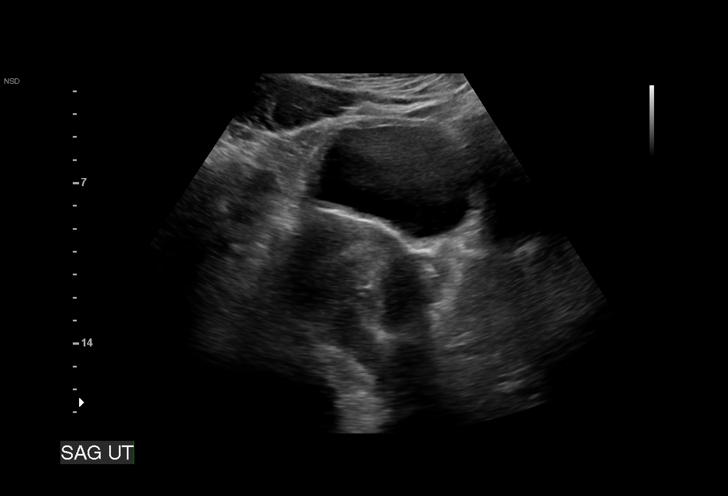
[im 4/38]
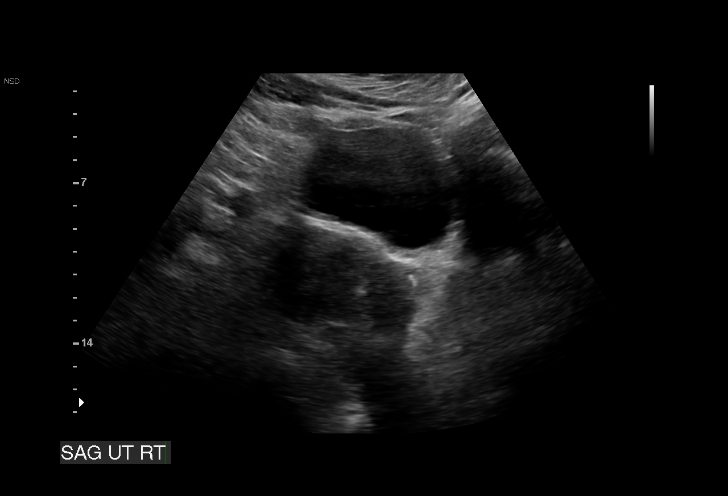
[im 7/38]
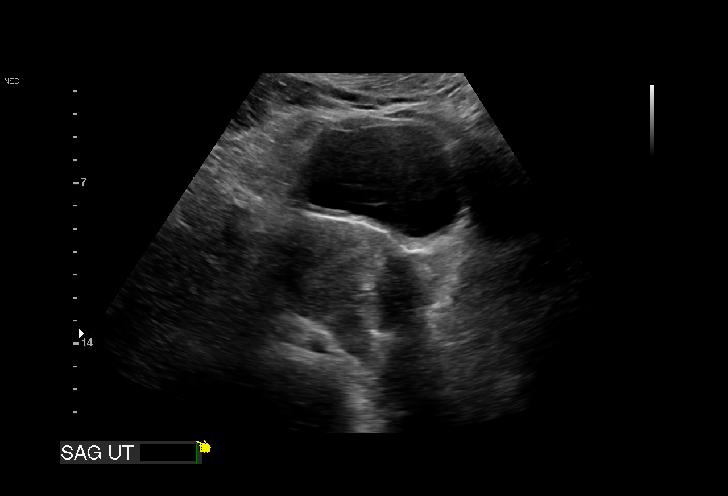
[im 8/38]
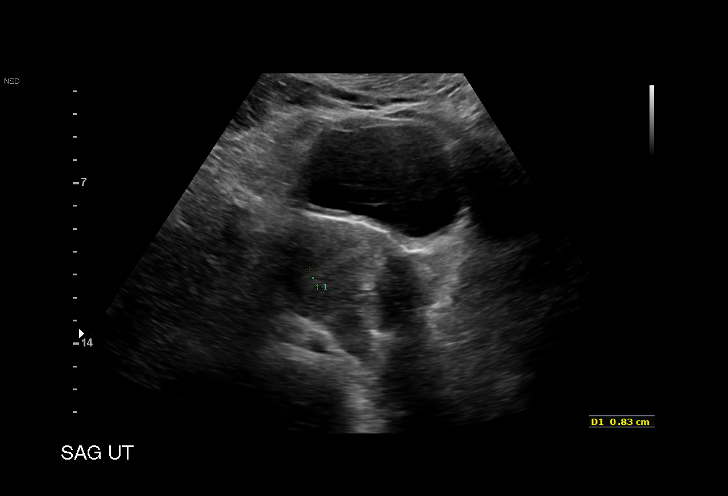
[im 11/38]
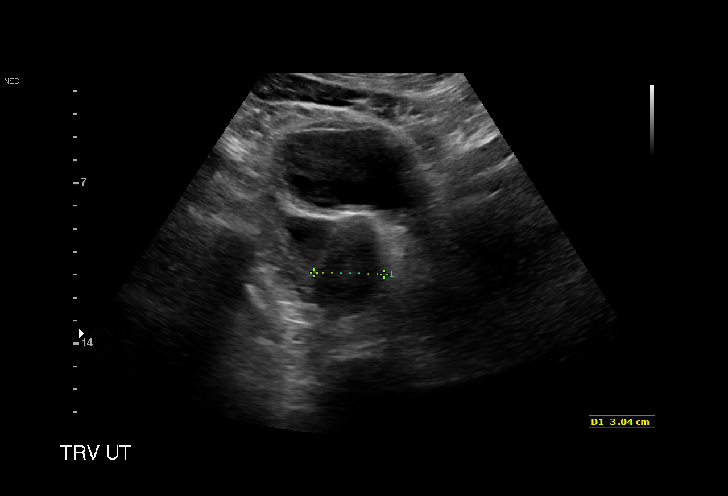
[im 14/38]
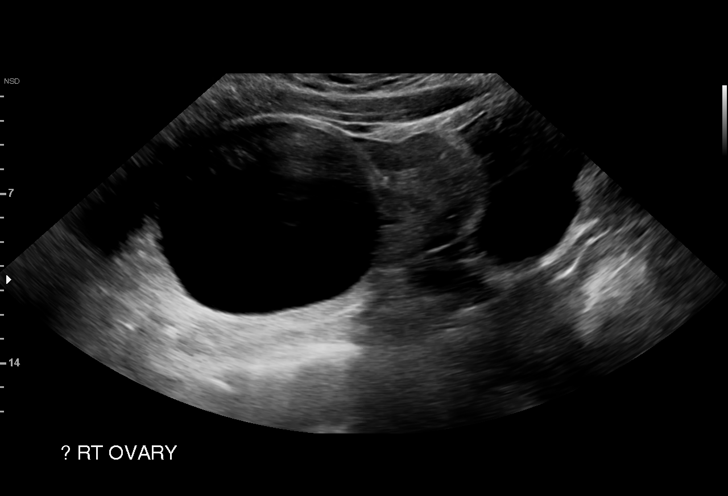
[im 16/38]
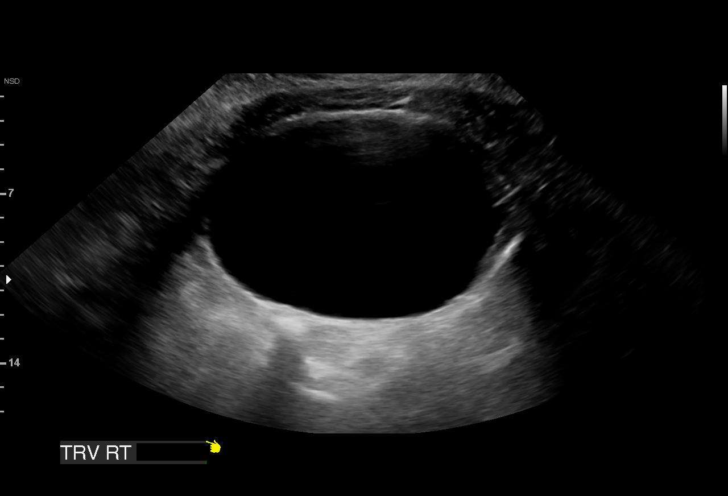
[im 19/38]
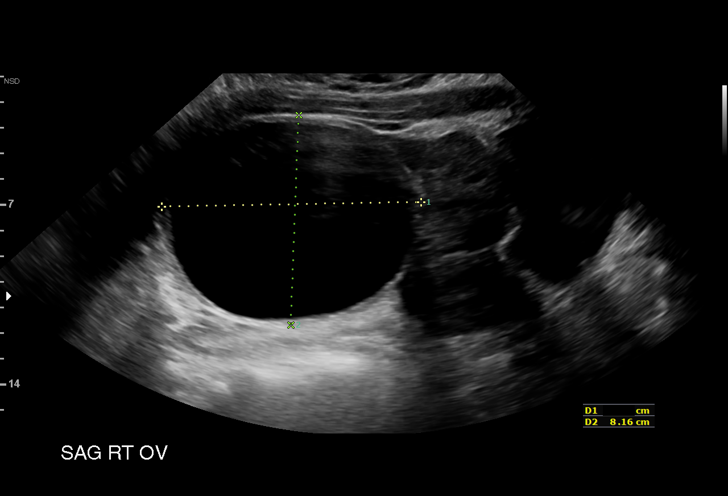
[im 22/38]
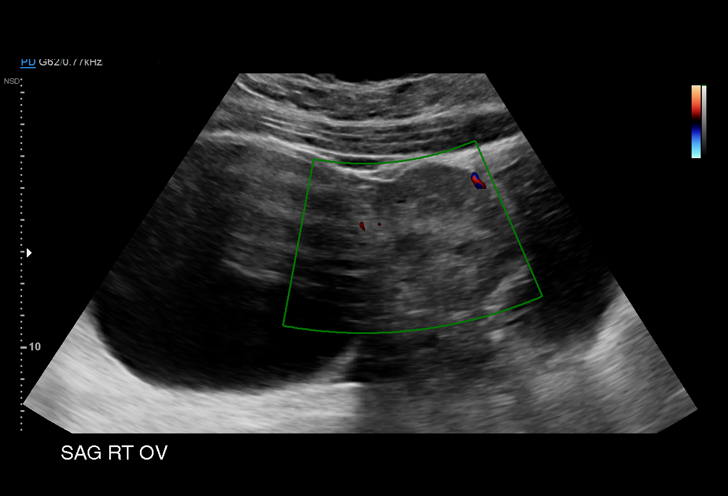
[im 24/38]
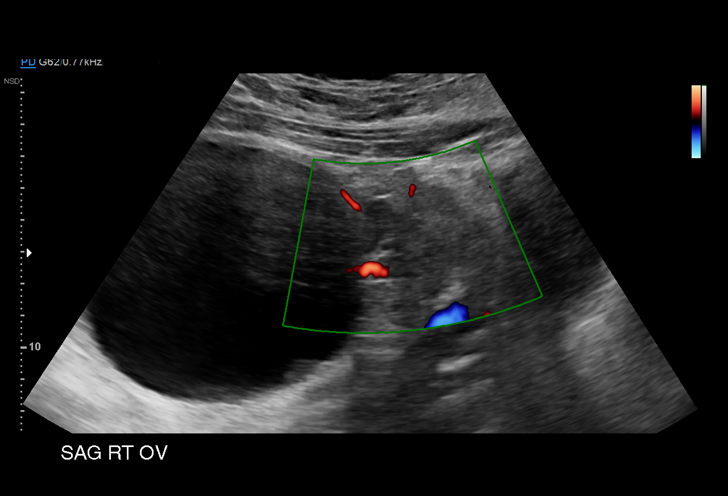
[im 27/38]
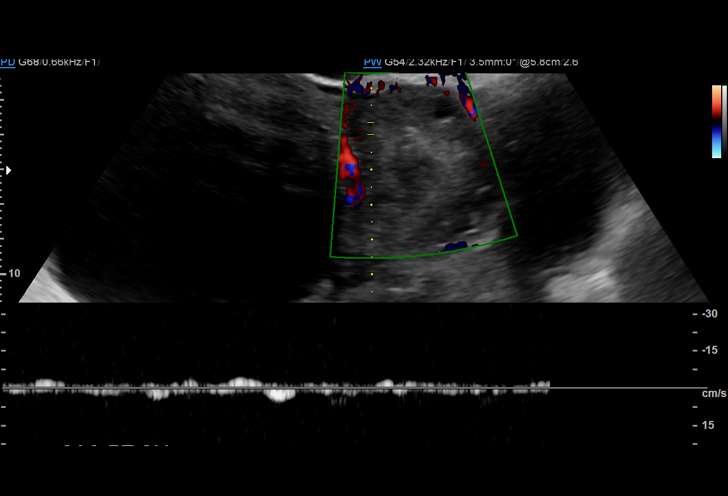
[im 30/38]
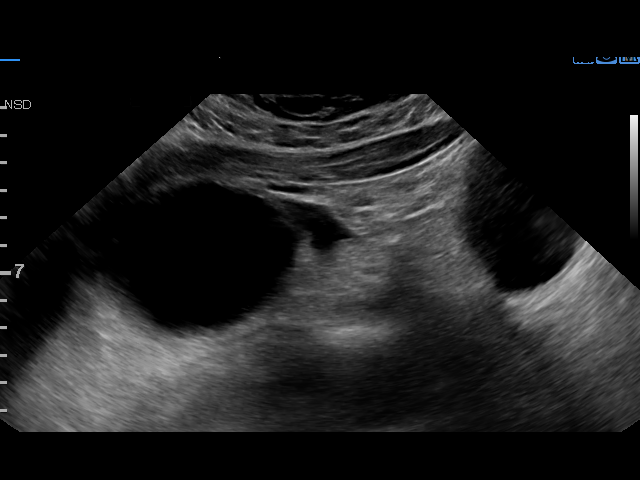
[im 31/38]
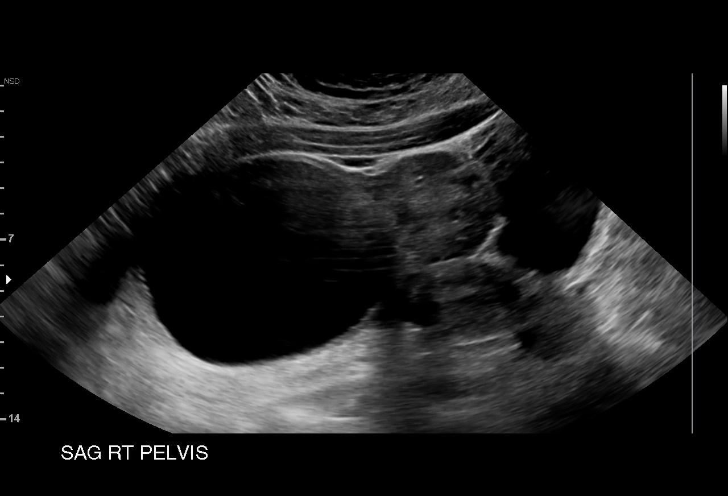
[im 34/38]
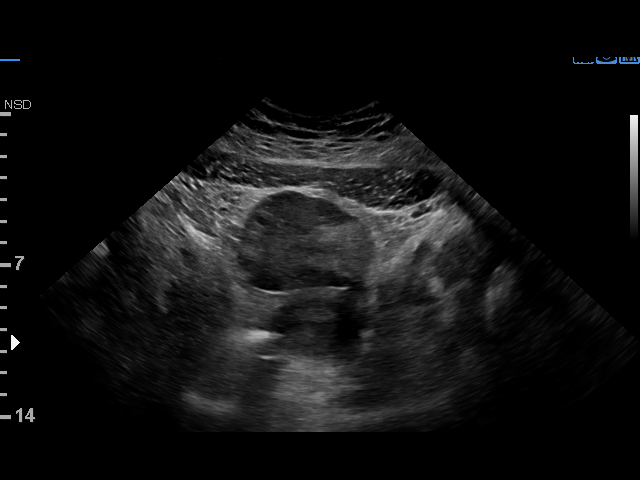
[im 38/38]
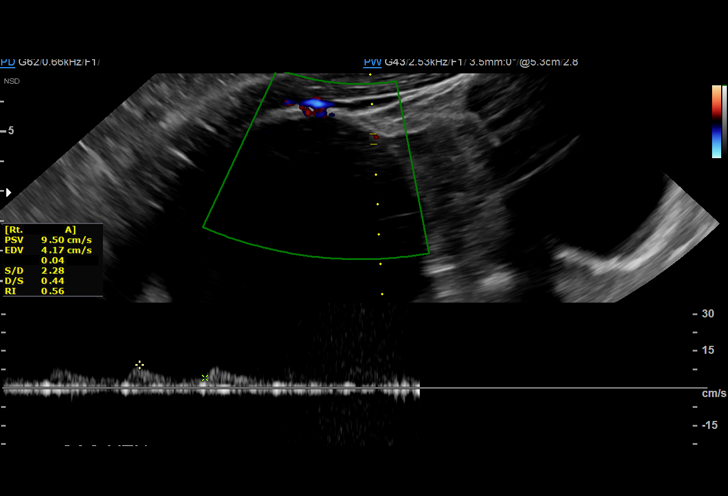

[15 of 25 positions shown; findings below may reference images not displayed]

FINDINGS: Uterus

Measurements: 7.7 x 4.1 x 3.0 cm. No fibroids or other mass
visualized.

Endometrium

Thickness: 8 mm.  No focal abnormality visualized.

Right ovary

Measurements: Not visualized.. 13.8 x 8.5 x 14 cm large cystic mass
is identified in the right adnexal space. This appears to be
adjacent to but cannot be definitely seen to arise from the right
ovary. There appears to be ovarian parenchyma immediately adjacent
to the cyst.

Left ovary

Measurements: Not visualized..

Pulsed Doppler evaluation demonstrates normal low-resistance
arterial and venous waveforms were documented in the apparent
ovarian parenchyma adjacent to the large right adnexal cyst. Left
ovary could not be visualized..

Trace free fluid in the cul-de-sac.
IMPRESSION: Limited study secondary to body habitus and inability to perform
endovaginal scanning. There is a large cystic lesion in the right
adnexal space measuring up to 13.8 cm. Apparent adjacent ovarian
parenchyma may be the right ovary, but the large cyst cannot be
confirmed to arise from the ovarian parenchyma. The sonographer does
document low resistance arterial and venous waveforms within this
apparent ovarian parenchyma.

Nonvisualization left ovary.

Given the size of this lesion, immediate further characterization by
CT with oral and intravenous contrast material may prove helpful.
Ultimately, MRI of the pelvis without and with contrast may be
warranted.

## 2018-09-19 ENCOUNTER — Other Ambulatory Visit: Payer: Self-pay | Admitting: Family Medicine

## 2018-09-19 DIAGNOSIS — F411 Generalized anxiety disorder: Secondary | ICD-10-CM

## 2018-09-23 IMAGING — US US ABDOMEN COMPLETE
1 series · 14 of 25 positions shown · non-contrast
Comparison: 12/08/2017 CT.

CLINICAL DATA: 21-year-old female with elevated liver enzymes.
Initial encounter.

EXAM:
ABDOMEN ULTRASOUND COMPLETE

[Series 1: us abdomen complete · 14 of 101 slices shown]
[im 1/101]
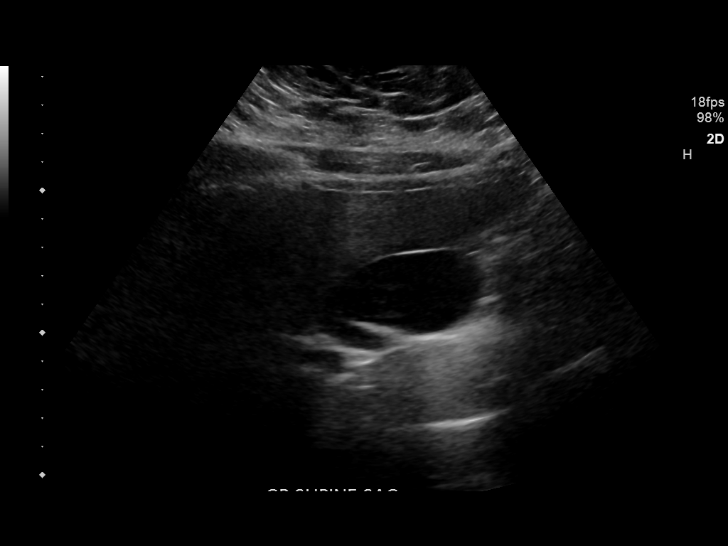
[im 9/101]
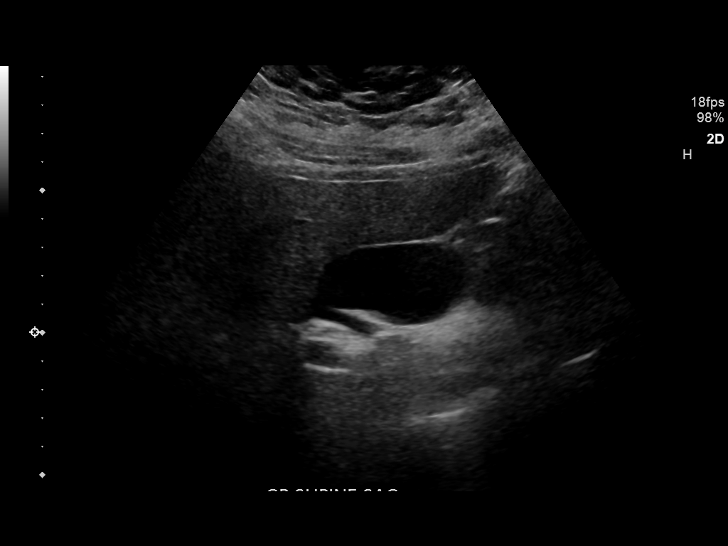
[im 17/101]
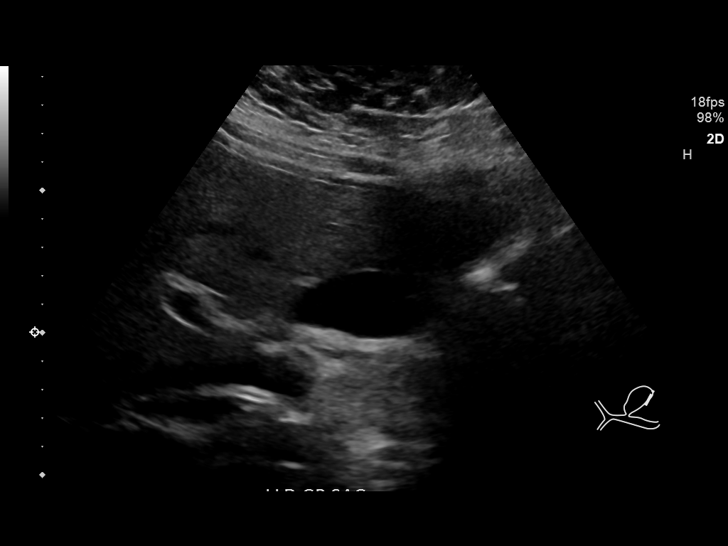
[im 26/101]
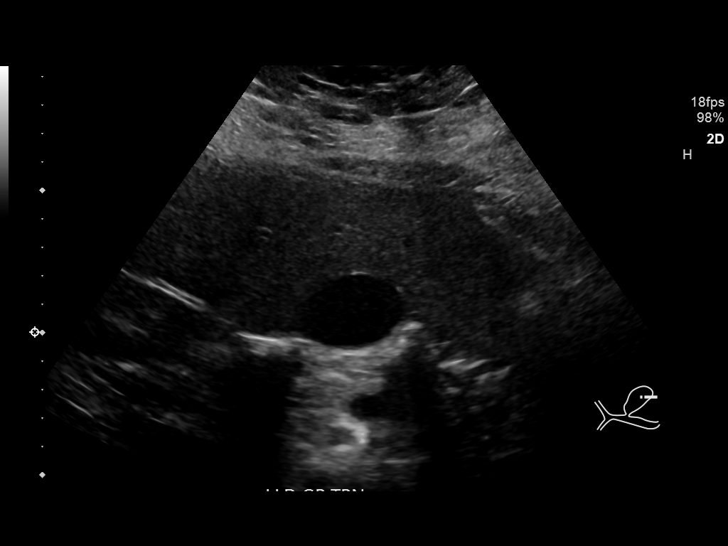
[im 34/101]
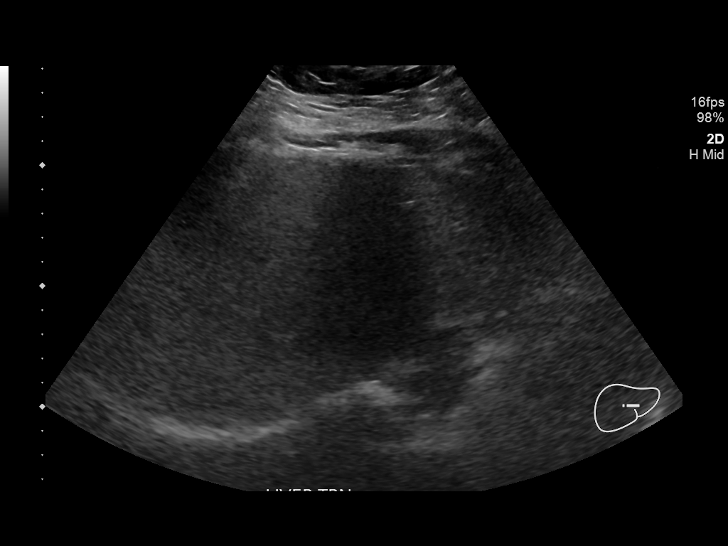
[im 38/101]
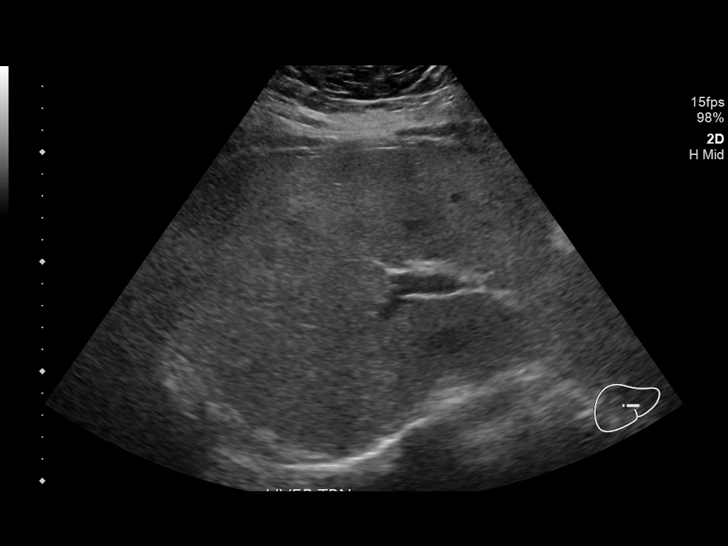
[im 46/101]
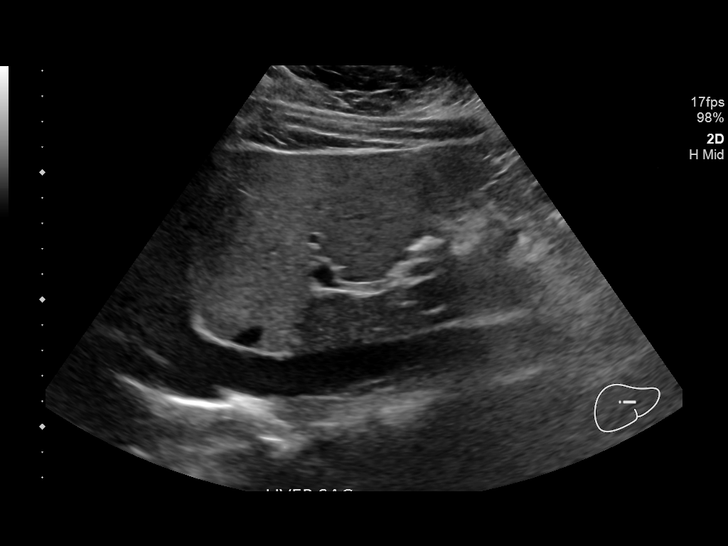
[im 55/101]
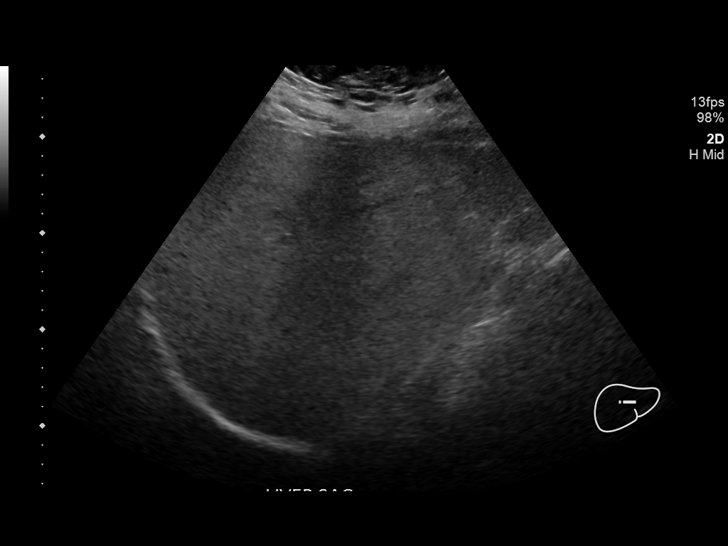
[im 63/101]
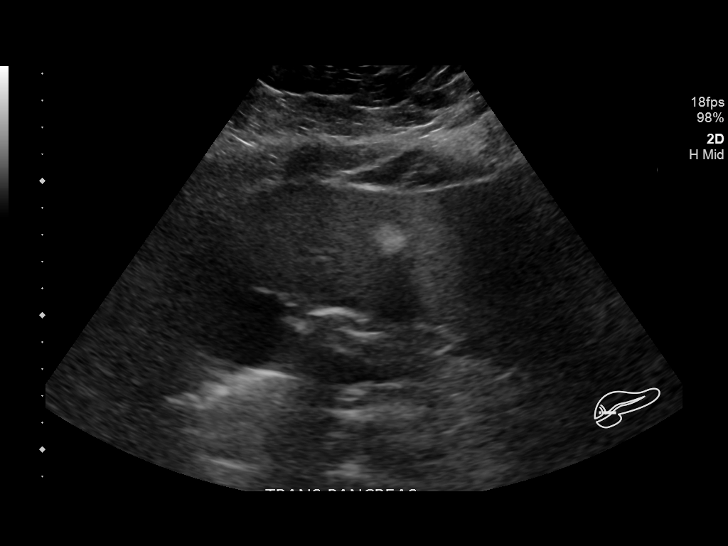
[im 67/101]
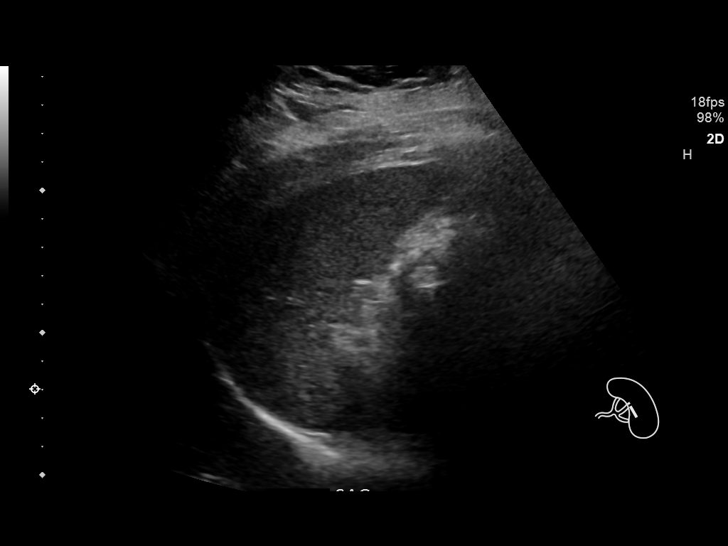
[im 76/101]
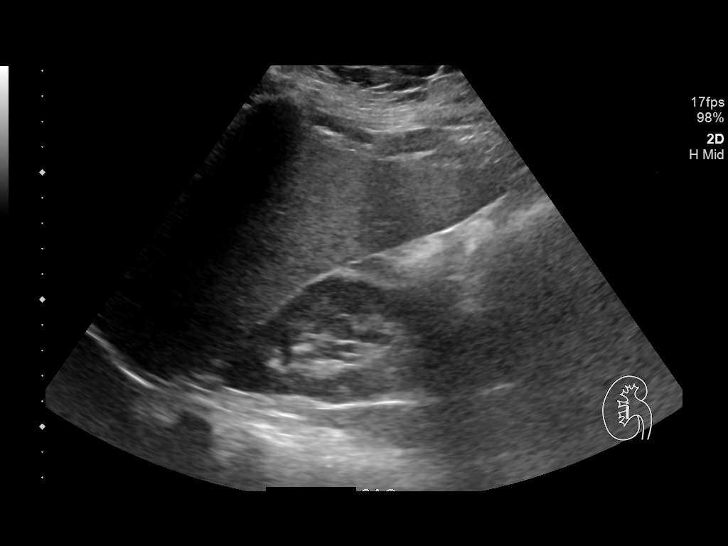
[im 84/101]
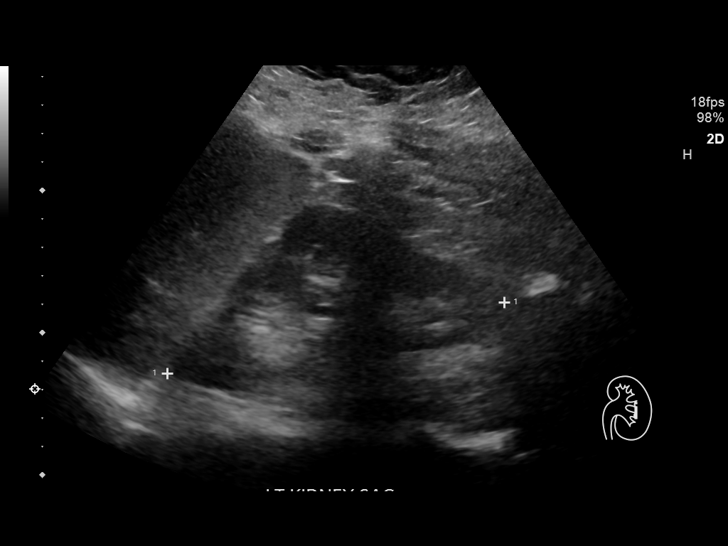
[im 92/101]
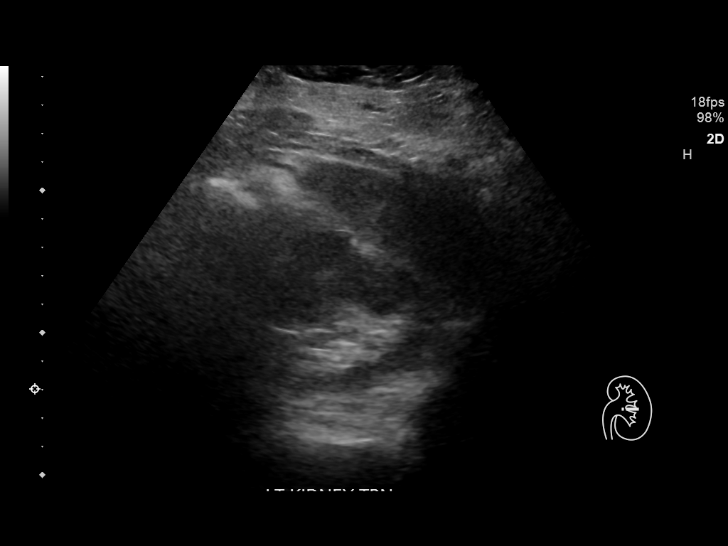
[im 101/101]
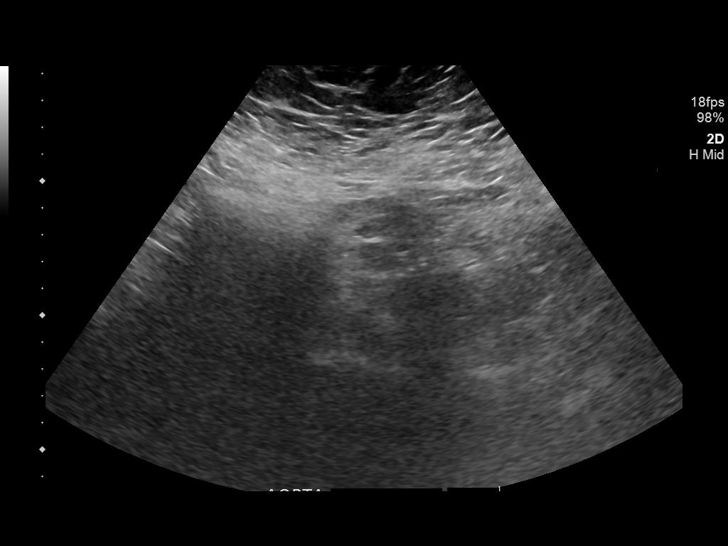

[14 of 25 positions shown; findings below may reference images not displayed]

FINDINGS: Gallbladder: No gallstones or wall thickening visualized. No
sonographic Murphy sign noted by sonographer.

Common bile duct: Diameter: 4 mm

Liver: Mild increased echogenicity suggestive of fatty infiltration.
No focal mass noted. Portal vein is patent on color Doppler imaging
with normal direction of blood flow towards the liver.

IVC: No abnormality visualized.

Pancreas: Not well visualized secondary to bowel gas.

Spleen: Size and appearance within normal limits.

Right Kidney: Length: 11.7 cm.  Years

Left Kidney: Length: 12.1 cm. Echogenicity within normal limits. No
mass or hydronephrosis visualized.

Abdominal aorta: Distal aspect not visualized secondary to bowel
gas. No aneurysm visualized.

Other findings: None.
IMPRESSION: Mild increased liver echogenicity suggestive of fatty infiltration.
No focal hepatic lesion noted.

Pancreas and distal abdominal aorta not visualized secondary to
bowel gas.

Otherwise negative exam.

## 2018-09-26 ENCOUNTER — Ambulatory Visit: Payer: Self-pay | Admitting: Family Medicine

## 2018-11-03 ENCOUNTER — Ambulatory Visit (INDEPENDENT_AMBULATORY_CARE_PROVIDER_SITE_OTHER): Payer: Self-pay | Admitting: Family Medicine

## 2018-11-03 ENCOUNTER — Other Ambulatory Visit: Payer: Self-pay

## 2018-11-03 ENCOUNTER — Encounter: Payer: Self-pay | Admitting: Family Medicine

## 2018-11-03 VITALS — BP 132/75 | HR 92 | Temp 98.2°F | Resp 18 | Ht 65.0 in | Wt 347.4 lb

## 2018-11-03 DIAGNOSIS — G43709 Chronic migraine without aura, not intractable, without status migrainosus: Secondary | ICD-10-CM

## 2018-11-03 DIAGNOSIS — F411 Generalized anxiety disorder: Secondary | ICD-10-CM

## 2018-11-03 MED ORDER — SERTRALINE HCL 25 MG PO TABS: 25.0000 mg | ORAL_TABLET | Freq: Every day | ORAL | 2 refills | Status: DC

## 2018-11-03 MED ORDER — TOPIRAMATE 50 MG PO TABS: 50.0000 mg | ORAL_TABLET | Freq: Every day | ORAL | 1 refills | Status: DC

## 2018-11-03 NOTE — Patient Instructions (Addendum)
I increased your topamax to 50 mg at bedtime. I also encourage you to continue with counseling and to go by the [facility] to see the psychiatrist, Dr. Darleene Cleaver or the Nurse Practitioner, Clarise Cruz.    Generalized Anxiety Disorder, Adult Generalized anxiety disorder (GAD) is a mental health disorder. People with this condition constantly worry about everyday events. Unlike normal anxiety, worry related to GAD is not triggered by a specific event. These worries also do not fade or get better with time. GAD interferes with life functions, including relationships, work, and school. GAD can vary from mild to severe. People with severe GAD can have intense waves of anxiety with physical symptoms (panic attacks). What are the causes? The exact cause of GAD is not known. What increases the risk? This condition is more likely to develop in:  Women.  People who have a family history of anxiety disorders.  People who are very shy.  People who experience very stressful life events, such as the death of a loved one.  People who have a very stressful family environment. What are the signs or symptoms? People with GAD often worry excessively about many things in their lives, such as their health and family. They may also be overly concerned about:  Doing well at work.  Being on time.  Natural disasters.  Friendships. Physical symptoms of GAD include:  Fatigue.  Muscle tension or having muscle twitches.  Trembling or feeling shaky.  Being easily startled.  Feeling like your heart is pounding or racing.  Feeling out of breath or like you cannot take a deep breath.  Having trouble falling asleep or staying asleep.  Sweating.  Nausea, diarrhea, or irritable bowel syndrome (IBS).  Headaches.  Trouble concentrating or remembering facts.  Restlessness.  Irritability. How is this diagnosed? Your health care provider can diagnose GAD based on your symptoms and medical  history. You will also have a physical exam. The health care provider will ask specific questions about your symptoms, including how severe they are, when they started, and if they come and go. Your health care provider may ask you about your use of alcohol or drugs, including prescription medicines. Your health care provider may refer you to a mental health specialist for further evaluation. Your health care provider will do a thorough examination and may perform additional tests to rule out other possible causes of your symptoms. To be diagnosed with GAD, a person must have anxiety that:  Is out of his or her control.  Affects several different aspects of his or her life, such as work and relationships.  Causes distress that makes him or her unable to take part in normal activities.  Includes at least three physical symptoms of GAD, such as restlessness, fatigue, trouble concentrating, irritability, muscle tension, or sleep problems. Before your health care provider can confirm a diagnosis of GAD, these symptoms must be present more days than they are not, and they must last for six months or longer. How is this treated? The following therapies are usually used to treat GAD:  Medicine. Antidepressant medicine is usually prescribed for long-term daily control. Antianxiety medicines may be added in severe cases, especially when panic attacks occur.  Talk therapy (psychotherapy). Certain types of talk therapy can be helpful in treating GAD by providing support, education, and guidance. Options include: ? Cognitive behavioral therapy (CBT). People learn coping skills and techniques to ease their anxiety. They learn to identify unrealistic or negative thoughts and behaviors and to  replace them with positive ones. ? Acceptance and commitment therapy (ACT). This treatment teaches people how to be mindful as a way to cope with unwanted thoughts and feelings. ? Biofeedback. This process trains you to  manage your body's response (physiological response) through breathing techniques and relaxation methods. You will work with a therapist while machines are used to monitor your physical symptoms.  Stress management techniques. These include yoga, meditation, and exercise. A mental health specialist can help determine which treatment is best for you. Some people see improvement with one type of therapy. However, other people require a combination of therapies. Follow these instructions at home:  Take over-the-counter and prescription medicines only as told by your health care provider.  Try to maintain a normal routine.  Try to anticipate stressful situations and allow extra time to manage them.  Practice any stress management or self-calming techniques as taught by your health care provider.  Do not punish yourself for setbacks or for not making progress.  Try to recognize your accomplishments, even if they are small.  Keep all follow-up visits as told by your health care provider. This is important. Contact a health care provider if:  Your symptoms do not get better.  Your symptoms get worse.  You have signs of depression, such as: ? A persistently sad, cranky, or irritable mood. ? Loss of enjoyment in activities that used to bring you joy. ? Change in weight or eating. ? Changes in sleeping habits. ? Avoiding friends or family members. ? Loss of energy for normal tasks. ? Feelings of guilt or worthlessness. Get help right away if:  You have serious thoughts about hurting yourself or others. If you ever feel like you may hurt yourself or others, or have thoughts about taking your own life, get help right away. You can go to your nearest emergency department or call:  Your local emergency services (911 in the [place]  A suicide crisis helpline, such as the [facility] at 347-618-0070. This is open 24 hours a day. Summary  Generalized anxiety  disorder (GAD) is a mental health disorder that involves worry that is not triggered by a specific event.  People with GAD often worry excessively about many things in their lives, such as their health and family.  GAD may cause physical symptoms such as restlessness, trouble concentrating, sleep problems, frequent sweating, nausea, diarrhea, headaches, and trembling or muscle twitching.  A mental health specialist can help determine which treatment is best for you. Some people see improvement with one type of therapy. However, other people require a combination of therapies. This information is not intended to replace advice given to you by your health care provider. Make sure you discuss any questions you have with your health care provider. Document Released: 12/29/2012 Document Revised: 07/24/2016 Document Reviewed: 07/24/2016 Elsevier Interactive Patient Education  2019 [employer].

## 2018-11-03 NOTE — Progress Notes (Signed)
  Patient [facility]   Progress Note: General Provider: Lanae Boast, FNP  SUBJECTIVE:   Chloe Pena is a 23 y.o. female who  has a past medical history of Anxiety, Asthma, Migraine, and PCOS (polycystic ovarian syndrome).. Patient presents today for Follow-up (out of antidepresssant medication, lost temper with mother a few days ago)   Patient states that she is doing well on her medications and would like to continue. She has not gone to see the PMHNP or psychiatrist at [facility] due to her class schedule. She states that she has lost her temper since the last visit, but states that it does not happen often. She denies delusional thinking, psychosis,  suicidal/homicidal ideations intent or plan.   Review of Systems  Constitutional: Negative.   Respiratory: Negative.   Cardiovascular: Negative.   Psychiatric/Behavioral: Negative.  Negative for substance abuse and suicidal ideas. The patient is not nervous/anxious and does not have insomnia.      OBJECTIVE: BP 132/75 (BP Location: Right Arm, Patient Position: Sitting, Cuff Size: Large)   Pulse 92   Temp 98.2 F (36.8 C) (Oral)   Resp 18   Ht 5\' 5"  (1.651 m)   Wt (!) 347 lb 6.4 oz (157.6 kg)   LMP  (LMP Unknown)   SpO2 100%   BMI 57.81 kg/m   Wt Readings from Last 3 Encounters:  11/03/18 (!) 347 lb 6.4 oz (157.6 kg)  06/26/18 (!) 339 lb (153.8 kg)  05/02/18 (!) 332 lb (150.6 kg)     Physical Exam Constitutional:      General: She is not in acute distress.    Appearance: She is obese.  Neurological:     Mental Status: She is alert.  Psychiatric:        Attention and Perception: Attention and perception normal.        Mood and Affect: Mood and affect normal.        Speech: Speech normal.        Behavior: Behavior normal. Behavior is cooperative.        Thought Content: Thought content normal.        Cognition and Memory: Cognition normal.        Judgment: Judgment normal.      ASSESSMENT/PLAN:  1. Chronic migraine without aura without status migrainosus, not intractable - topiramate (TOPAMAX) 50 MG tablet; Take 1 tablet (50 mg total) by mouth at bedtime.  Dispense: 90 tablet; Refill: 1  2. Generalized anxiety disorder No medication changes warranted at the present time.   - sertraline (ZOLOFT) 25 MG tablet; Take 1 tablet (25 mg total) by mouth daily.  Dispense: 30 tablet; Refill: 2    Return in about 3 months (around 02/01/2019) for anxiety.    The patient was given clear instructions to go to ER or return to medical center if symptoms do not improve, worsen or new problems develop. The patient verbalized understanding and agreed with plan of care.   [name]. Nathaneil Canary, FNP-BC [facility] [address] [place], [place] [address] [phone]

## 2018-11-18 ENCOUNTER — Other Ambulatory Visit: Payer: Self-pay | Admitting: Family Medicine

## 2018-12-22 ENCOUNTER — Encounter: Payer: Self-pay | Admitting: Family Medicine

## 2019-01-06 ENCOUNTER — Other Ambulatory Visit: Payer: Self-pay | Admitting: Family Medicine

## 2019-01-06 DIAGNOSIS — F411 Generalized anxiety disorder: Secondary | ICD-10-CM

## 2019-01-27 DIAGNOSIS — H5213 Myopia, bilateral: Secondary | ICD-10-CM | POA: Diagnosis not present

## 2019-01-27 DIAGNOSIS — H52223 Regular astigmatism, bilateral: Secondary | ICD-10-CM | POA: Diagnosis not present

## 2019-02-02 ENCOUNTER — Encounter: Payer: Self-pay | Admitting: Family Medicine

## 2019-02-02 ENCOUNTER — Ambulatory Visit (INDEPENDENT_AMBULATORY_CARE_PROVIDER_SITE_OTHER): Payer: Self-pay | Admitting: Family Medicine

## 2019-02-02 ENCOUNTER — Other Ambulatory Visit: Payer: Self-pay

## 2019-02-02 VITALS — BP 145/81 | HR 90 | Temp 99.4°F | Resp 16 | Ht 65.0 in | Wt 354.0 lb

## 2019-02-02 DIAGNOSIS — F411 Generalized anxiety disorder: Secondary | ICD-10-CM

## 2019-02-02 DIAGNOSIS — G43709 Chronic migraine without aura, not intractable, without status migrainosus: Secondary | ICD-10-CM

## 2019-02-02 MED ORDER — SERTRALINE HCL 50 MG PO TABS: 50.0000 mg | ORAL_TABLET | Freq: Every day | ORAL | 2 refills | Status: DC

## 2019-02-02 MED ORDER — TOPIRAMATE 100 MG PO TABS: 100.0000 mg | ORAL_TABLET | Freq: Every day | ORAL | 0 refills | Status: DC

## 2019-02-02 NOTE — Progress Notes (Signed)
  Patient [facility]   Progress Note: General Provider: Lanae Boast, FNP  SUBJECTIVE:   Chloe Pena is a 23 y.o. female who  has a past medical history of Anxiety, Asthma, Migraine, and PCOS (polycystic ovarian syndrome).. Patient presents today for Depression (worse )   Patient states that her depression is worsening due to her mother having untreated bipolar disorder. She states that her mother talks negatively to her about her depression which causes Keylee increased anxiety and irritation. She is not seeing a counselor or psychiatrist. She denies psychosis, delusional thinking, suicidal/homicidal ideations, intent, or plan.   Review of Systems  Constitutional: Negative.   Psychiatric/Behavioral: Positive for depression. Negative for hallucinations, memory loss, substance abuse and suicidal ideas. The patient is nervous/anxious. The patient does not have insomnia.      OBJECTIVE: BP (!) 145/81 (BP Location: Left Arm, Patient Position: Sitting, Cuff Size: Large)   Pulse 90   Temp 99.4 F (37.4 C) (Oral)   Resp 16   Ht 5\' 5"  (1.651 m)   Wt (!) 354 lb (160.6 kg)   SpO2 99%   BMI 58.91 kg/m   Wt Readings from Last 3 Encounters:  02/02/19 (!) 354 lb (160.6 kg)  11/03/18 (!) 347 lb 6.4 oz (157.6 kg)  06/26/18 (!) 339 lb (153.8 kg)     Physical Exam Psychiatric:        Attention and Perception: Attention and perception normal.        Mood and Affect: Mood is anxious and depressed.        Speech: Speech normal.        Behavior: Behavior normal. Behavior is cooperative.        Thought Content: Thought content normal.        Cognition and Memory: Cognition normal.        Judgment: Judgment normal.     ASSESSMENT/PLAN:   1. Generalized anxiety disorder - sertraline (ZOLOFT) 50 MG tablet; Take 1 tablet (50 mg total) by mouth daily.  Dispense: 30 tablet; Refill: 2  2. Chronic migraine without aura without status migrainosus,  not intractable Increased topamax to possibly aid in mood stabilization.  - topiramate (TOPAMAX) 100 MG tablet; Take 1 tablet (100 mg total) by mouth at bedtime.  Dispense: 90 tablet; Refill: 0    Return in about 3 weeks (around 02/23/2019) for Anxiety - Increased medication.    The patient was given clear instructions to go to ER or return to medical center if symptoms do not improve, worsen or new problems develop. The patient verbalized understanding and agreed with plan of care.   [name]. Nathaneil Canary, FNP-BC [facility] [address] [place], [place] [address] [phone]

## 2019-02-02 NOTE — Patient Instructions (Signed)
Living With Anxiety    After being diagnosed with an anxiety disorder, you may be relieved to know why you have felt or behaved a certain way. It is natural to also feel overwhelmed about the treatment ahead and what it will mean for your life. With care and support, you can manage this condition and recover from it.  How to cope with anxiety  Dealing with stress  Stress is your body’s reaction to life changes and events, both good and bad. Stress can last just a few hours or it can be ongoing. Stress can play a major role in anxiety, so it is important to learn both how to cope with stress and how to think about it differently.  Talk with your health care provider or a counselor to learn more about stress reduction. He or she may suggest some stress reduction techniques, such as:  · Music therapy. This can include creating or listening to music that you enjoy and that inspires you.  · Mindfulness-based meditation. This involves being aware of your normal breaths, rather than trying to control your breathing. It can be done while sitting or walking.  · Centering prayer. This is a kind of meditation that involves focusing on a word, phrase, or sacred image that is meaningful to you and that brings you peace.  · Deep breathing. To do this, expand your stomach and inhale slowly through your nose. Hold your breath for 3-5 seconds. Then exhale slowly, allowing your stomach muscles to relax.  · Self-talk. This is a skill where you identify thought patterns that lead to anxiety reactions and correct those thoughts.  · Muscle relaxation. This involves tensing muscles then relaxing them.  Choose a stress reduction technique that fits your lifestyle and personality. Stress reduction techniques take time and practice. Set aside 5-15 minutes a day to do them. Therapists can offer training in these techniques. The training may be covered by some insurance plans. Other things you can do to manage stress include:  · Keeping a  stress diary. This can help you learn what triggers your stress and ways to control your response.  · Thinking about how you respond to certain situations. You may not be able to control everything, but you can control your reaction.  · Making time for activities that help you relax, and not feeling guilty about spending your time in this way.  Therapy combined with coping and stress-reduction skills provides the best chance for successful treatment.  Medicines  Medicines can help ease symptoms. Medicines for anxiety include:  · Anti-anxiety drugs.  · Antidepressants.  · Beta-blockers.  Medicines may be used as the main treatment for anxiety disorder, along with therapy, or if other treatments are not working. Medicines should be prescribed by a health care provider.  Relationships  Relationships can play a big part in helping you recover. Try to spend more time connecting with trusted friends and family members. Consider going to couples counseling, taking family education classes, or going to family therapy. Therapy can help you and others better understand the condition.  How to recognize changes in your condition  Everyone has a different response to treatment for anxiety. Recovery from anxiety happens when symptoms decrease and stop interfering with your daily activities at home or work. This may mean that you will start to:  · Have better concentration and focus.  · Sleep better.  · Be less irritable.  · Have more energy.  · Have improved memory.  It is   important to recognize when your condition is getting worse. Contact your health care provider if your symptoms interfere with home or work and you do not feel like your condition is improving.  Where to find help and support:  You can get help and support from these sources:  · Self-help groups.  · Online and community organizations.  · A trusted spiritual leader.  · Couples counseling.  · Family education classes.  · Family therapy.  Follow these instructions  at home:  · Eat a healthy diet that includes plenty of vegetables, fruits, whole grains, low-fat dairy products, and lean protein. Do not eat a lot of foods that are high in solid fats, added sugars, or salt.  · Exercise. Most adults should do the following:  ? Exercise for at least 150 minutes each week. The exercise should increase your heart rate and make you sweat (moderate-intensity exercise).  ? Strengthening exercises at least twice a week.  · Cut down on caffeine, tobacco, alcohol, and other potentially harmful substances.  · Get the right amount and quality of sleep. Most adults need 7-9 hours of sleep each night.  · Make choices that simplify your life.  · Take over-the-counter and prescription medicines only as told by your health care provider.  · Avoid caffeine, alcohol, and certain over-the-counter cold medicines. These may make you feel worse. Ask your pharmacist which medicines to avoid.  · Keep all follow-up visits as told by your health care provider. This is important.  Questions to ask your health care provider  · Would I benefit from therapy?  · How often should I follow up with a health care provider?  · How long do I need to take medicine?  · Are there any long-term side effects of my medicine?  · Are there any alternatives to taking medicine?  Contact a health care provider if:  · You have a hard time staying focused or finishing daily tasks.  · You spend many hours a day feeling worried about everyday life.  · You become exhausted by worry.  · You start to have headaches, feel tense, or have nausea.  · You urinate more than normal.  · You have diarrhea.  Get help right away if:  · You have a racing heart and shortness of breath.  · You have thoughts of hurting yourself or others.  If you ever feel like you may hurt yourself or others, or have thoughts about taking your own life, get help right away. You can go to your nearest emergency department or call:  · Your local emergency services  (911 in the [place]  · A suicide crisis helpline, such as the National Suicide Prevention Lifeline at 1-800-273-8255. This is open 24-hours a day.  Summary  · Taking steps to deal with stress can help calm you.  · Medicines cannot cure anxiety disorders, but they can help ease symptoms.  · Family, friends, and partners can play a big part in helping you recover from an anxiety disorder.  This information is not intended to replace advice given to you by your health care provider. Make sure you discuss any questions you have with your health care provider.  Document Released: 08/28/2016 Document Revised: 08/28/2016 Document Reviewed: 08/28/2016  Elsevier Interactive Patient Education © 2019 [employer]

## 2019-02-23 ENCOUNTER — Ambulatory Visit: Payer: Self-pay | Admitting: Family Medicine

## 2019-02-25 ENCOUNTER — Ambulatory Visit: Payer: Self-pay | Admitting: Family Medicine

## 2019-02-27 ENCOUNTER — Telehealth: Payer: Self-pay

## 2019-02-27 NOTE — Telephone Encounter (Signed)
Called to do COVID Screening for appointment tomorrow. No answer. Left a message to call back. Thanks! 

## 2019-03-02 ENCOUNTER — Ambulatory Visit (INDEPENDENT_AMBULATORY_CARE_PROVIDER_SITE_OTHER): Payer: Self-pay | Admitting: Family Medicine

## 2019-03-02 ENCOUNTER — Encounter: Payer: Self-pay | Admitting: Family Medicine

## 2019-03-02 ENCOUNTER — Other Ambulatory Visit: Payer: Self-pay

## 2019-03-02 VITALS — BP 120/70 | HR 100 | Temp 97.8°F | Resp 16 | Ht 65.0 in | Wt 352.0 lb

## 2019-03-02 DIAGNOSIS — R6889 Other general symptoms and signs: Secondary | ICD-10-CM

## 2019-03-02 DIAGNOSIS — M25562 Pain in left knee: Secondary | ICD-10-CM

## 2019-03-02 DIAGNOSIS — B372 Candidiasis of skin and nail: Secondary | ICD-10-CM

## 2019-03-02 DIAGNOSIS — L750 Bromhidrosis: Secondary | ICD-10-CM

## 2019-03-02 DIAGNOSIS — F411 Generalized anxiety disorder: Secondary | ICD-10-CM

## 2019-03-02 DIAGNOSIS — R61 Generalized hyperhidrosis: Secondary | ICD-10-CM

## 2019-03-02 DIAGNOSIS — N898 Other specified noninflammatory disorders of vagina: Secondary | ICD-10-CM

## 2019-03-02 MED ORDER — NYSTATIN 100000 UNIT/GM EX POWD: Freq: Four times a day (QID) | CUTANEOUS | 2 refills | Status: DC

## 2019-03-02 MED ORDER — KNEE BRACE MISC: 1.0000 [IU] | Freq: Every day | 0 refills | Status: DC

## 2019-03-02 MED ORDER — DRYSOL 20 % EX SOLN: Freq: Every day | CUTANEOUS | 2 refills | Status: DC

## 2019-03-02 MED ORDER — FLUCONAZOLE 150 MG PO TABS: 150.0000 mg | ORAL_TABLET | ORAL | 0 refills | Status: DC

## 2019-03-02 NOTE — Progress Notes (Signed)
Patient [facility]   Progress Note: General Provider: Lanae Boast, FNP  SUBJECTIVE:   Chloe Pena is a 23 y.o. female who  has a past medical history of Anxiety, Asthma, Migraine, and PCOS (polycystic ovarian syndrome).. Patient presents today for Anxiety, Knee Problem (left knee "bends back too far" ), and Follow-up (body odor )  Patient reports having a vaginal odor that has been present for several months. She states that she also uses clinical strength deodorant without relief. She denies coitarche.  Patient reports that her knee hyperextends intermittently. She is doing well with her depression and anxiety medications. She denies SI, HI, AH, VH.   Review of Systems  Constitutional: Negative.   HENT: Negative.   Eyes: Negative.   Respiratory: Negative.   Cardiovascular: Negative.   Gastrointestinal: Negative.   Genitourinary: Negative.        Vaginal odor   Musculoskeletal: Positive for joint pain.  Skin: Negative.   Neurological: Negative.   Endo/Heme/Allergies:       Excessive sweating   Psychiatric/Behavioral: Negative.      OBJECTIVE: BP 120/70 (BP Location: Left Arm, Patient Position: Sitting, Cuff Size: Large)   Pulse 100   Temp 97.8 F (36.6 C) (Oral)   Resp 16   Ht 5\' 5"  (1.651 m)   Wt (!) 352 lb (159.7 kg)   SpO2 99%   BMI 58.58 kg/m   Wt Readings from Last 3 Encounters:  03/02/19 (!) 352 lb (159.7 kg)  02/02/19 (!) 354 lb (160.6 kg)  11/03/18 (!) 347 lb 6.4 oz (157.6 kg)     Physical Exam Vitals signs and nursing note reviewed.  Constitutional:      General: She is not in acute distress.    Appearance: Normal appearance.  HENT:     Head: Normocephalic and atraumatic.  Eyes:     Extraocular Movements: Extraocular movements intact.     Conjunctiva/sclera: Conjunctivae normal.     Pupils: Pupils are equal, round, and reactive to light.  Cardiovascular:     Rate and Rhythm: Normal rate and  regular rhythm.     Heart sounds: No murmur.  Pulmonary:     Effort: Pulmonary effort is normal.     Breath sounds: Normal breath sounds.  Musculoskeletal: Normal range of motion.  Skin:    General: Skin is warm and dry.     Findings: Erythema and rash present. Rash is papular.       Neurological:     Mental Status: She is alert and oriented to person, place, and time.  Psychiatric:        Attention and Perception: Attention and perception normal.        Mood and Affect: Mood normal.        Speech: Speech normal.        Behavior: Behavior normal. Behavior is cooperative.        Thought Content: Thought content normal.        Cognition and Memory: Cognition and memory normal.        Judgment: Judgment normal.     ASSESSMENT/PLAN:  1. Acute pain of left knee Xray ordered.  - Elastic Bandages & Supports (KNEE BRACE) MISC; 1 Units by Does not apply route daily.  Dispense: 1 each; Refill: 0  2. Abnormal body odor - NuSwab Vaginitis Plus (VG+) - nystatin (MYCOSTATIN/NYSTOP) powder; Apply topically 4 (four) times daily.  Dispense: 60 g; Refill: 2 - fluconazole (DIFLUCAN) 150 MG tablet;  Take 1 tablet (150 mg total) by mouth every 3 (three) days.  Dispense: 3 tablet; Refill: 0  3. Generalized anxiety disorder No medication changes warranted at the present time.    4. Hyperhidrosis - aluminum chloride (DRYSOL) 20 % external solution; Apply topically at bedtime.  Dispense: 35 mL; Refill: 2  5. Candidal intertrigo Recommended daily showers. Nystatin powder and drysol - nystatin (MYCOSTATIN/NYSTOP) powder; Apply topically 4 (four) times daily.  Dispense: 60 g; Refill: 2 - fluconazole (DIFLUCAN) 150 MG tablet; Take 1 tablet (150 mg total) by mouth every 3 (three) days.  Dispense: 3 tablet; Refill: 0 .   Return in about 3 months (around 06/02/2019) for Anxiety.    The patient was given clear instructions to go to ER or return to medical center if symptoms do not improve, worsen or  new problems develop. The patient verbalized understanding and agreed with plan of care.   [name]. Nathaneil Canary, FNP-BC [facility] [address] [place], [place] [address] [phone]

## 2019-03-02 NOTE — Patient Instructions (Addendum)
[facility] on [address] for your xray.   Intertrigo Intertrigo is skin irritation (inflammation) that happens in warm, moist areas of the body. The irritation can cause a rash and make skin raw and itchy. The rash is usually pink or red. It happens mostly between folds of skin or where skin rubs together, such as:  Between the toes.  In the armpits.  In the groin area.  Under the belly.  Under the breasts.  Around the butt area. This condition is not passed from person to person (is not contagious). What are the causes?  Heat, moisture, rubbing, and not enough air movement.  The condition can be made worse by: ? Sweat. ? Bacteria. ? A fungus, such as yeast. What increases the risk?  Moisture in your skin folds.  You are more likely to develop this condition if you: ? Have diabetes. ? Are overweight. ? Are not able to move around. ? Live in a warm and moist climate. ? Wear splints, braces, or other medical devices. ? Are not able to control your pee (urine) or poop (stool). What are the signs or symptoms?  A pink or red skin rash in the skin fold or near the skin fold.  Raw or scaly skin.  Itching.  A burning feeling.  Bleeding.  Leaking fluid.  A bad smell. How is this treated?  Cleaning and drying your skin.  Taking an antibiotic medicine or using an antibiotic skin cream for a bacterial infection.  Using an antifungal cream on your skin or taking pills for an infection that was caused by a fungus, such as yeast.  Using a steroid ointment to stop the itching and irritation.  Separating the skin fold with a clean cotton cloth to absorb moisture and allow air to flow into the area. Follow these instructions at home:  Keep the affected area clean and dry.  Do not scratch your skin.  Stay cool as much as you can. Use an air conditioner or a fan, if you have one.  Apply over-the-counter and prescription medicines only as told by your  doctor.  If you were prescribed an antibiotic medicine, use it as told by your doctor. Do not stop using the antibiotic even if your condition starts to get better.  Keep all follow-up visits as told by your doctor. This is important. How is this prevented?   Stay at a healthy weight.  Take care of your feet. This is very important if you have diabetes. You should: ? Wear shoes that fit well. ? Keep your feet dry. ? Wear clean cotton or wool socks.  Protect the skin in your groin and butt area as told by your doctor. To do this: ? Follow a regular cleaning routine. ? Use creams, powders, or ointments that protect your skin. ? Change protection pads often.  Do not wear tight clothes. Wear clothes that: ? Are loose. ? Take moisture away from your body. ? Are made of cotton.  Wear a bra that gives good support, if needed.  Shower and dry yourself well after being active. Use a hair dryer on a cool setting to dry between skin folds.  Keep your blood sugar under control if you have diabetes. Contact a doctor if:  Your symptoms do not get better with treatment.  Your symptoms get worse or they spread.  You notice more redness and warmth.  You have a fever. Summary  Intertrigo is skin irritation that occurs when folds of  skin rub together.  This condition is caused by heat, moisture, and rubbing.  This condition may be treated by cleaning and drying your skin and with medicines.  Apply over-the-counter and prescription medicines only as told by your doctor.  Keep all follow-up visits as told by your doctor. This is important. This information is not intended to replace advice given to you by your health care provider. Make sure you discuss any questions you have with your health care provider. Document Released: 10/06/2010 Document Revised: 02/03/2018 Document Reviewed: 03/07/2015 Elsevier Interactive Patient Education  2019 [place]. Hyperhidrosis Hyperhidrosis is  a condition in which the body sweats a lot more than normal (excessively). Sweating is a necessary function for a human body. It is normal to sweat when you are hot, physically active, or anxious. However, hyperhidrosis is sweating to an excessive degree. Although the condition is not a serious one, it can make you feel embarrassed. There are two kinds of hyperhidrosis:  Primary hyperhidrosis. The sweating usually localizes in one part of your body, such as your underarms, or in a few areas, such as your feet, face, underarms, and hands. This is the more common kind of hyperhidrosis.  Secondary hyperhidrosis. This type usually affects your entire body. What are the causes? The cause of this condition depends on the kind of hyperhidrosis that you have.  Primary hyperhidrosis may be caused by sweat glands that are more active than normal.  Secondary hyperhidrosis may be caused by an underlying condition or by taking certain medicines, such as antidepressants or diabetes medicines. Possible conditions that may cause secondary hyperhidrosis include: ? Diabetes. ? Gout. ? Anxiety. ? Obesity. ? Menopause. ? Overactive thyroid (hyperthyroidism). ? Tumors. ? Frostbite. ? Certain types of cancers. ? Alcoholism. ? Injury to your nervous system. ? Stroke. ? Parkinson's disease. What increases the risk? You are more likely to develop primary hyperhidrosis if you have a family history of the condition. What are the signs or symptoms? Symptoms of this condition include:  Feeling like you are sweating constantly, even while you are not being active.  Having skin that peels or gets paler or softer in the areas where you sweat the most.  Being able to see sweat on your skin. Other symptoms depend on the kind of hyperhidrosis that you have.  Symptoms of primary hyperhidrosis may include: ? Sweating in the same location on both sides of your body. ? Sweating only during the day and not while you  are sleeping. ? Sweating in specific areas, such as your underarms, palms, feet, and face.  Symptoms of secondary hyperhidrosis may include: ? Sweating all over your body. ? Sweating even while you sleep. How is this diagnosed? This condition may be diagnosed by:  Medical history.  Physical exam. You may also have other tests, including:  Tests to measure the amount of sweat you produce and to show the areas where you sweat the most. These tests may involve: ? Using color-changing chemicals to show patterns of sweating on the skin. ? Weighing paper that has been applied to the skin. This will show the amount of sweat that your body produces. ? Measuring the amount of water that evaporates from the skin. ? Using infrared technology to show patterns of sweating on the skin.  Tests to check for other conditions that may be causing excess sweating. This may include blood, urine, or imaging tests. How is this treated? Treatment for this condition depends on the kind of hyperhidrosis that you  have and the areas of your body that are affected. Your health care provider will also treat any underlying conditions. Treatment may include:  Medicines, such as: ? Antiperspirants. These are medicines that stop sweat. ? Injectable medicines. These may include small injections of botulinum toxin. ? Oral medicines. These are taken by mouth to treat underlying conditions and other symptoms.  A procedure to: ? Temporarily turn off the sweat glands in your hands and feet (iontophoresis). ? Remove your sweat glands. ? Cut or destroy the nerves so that they do not send a signal to the sweat glands (sympathectomy). Follow these instructions at home: Lifestyle   Limit or avoid foods or beverages that may increase your risk of sweating, such as: ? Spicy food. ? Caffeine. ? Alcohol. ? Foods that contain monosodium glutamate (MSG).  If your feet sweat: ? Wear sandals when possible. ? Do not wear  cotton socks. Wear socks that remove or wick moisture from your feet. ? Wear leather shoes. ? Avoid wearing the same pair of shoes for two days in a row.  Try placing sweat pads under your clothes to prevent underarm sweat from showing.  Keep a journal of your sweat symptoms and when they occur. This may help you identify things that trigger your sweating. General instructions  Take over-the-counter and prescription medicines only as told by your health care provider.  Use antiperspirants as told by your health care provider.  Consider joining a hyperhidrosis support group.  Keep all follow-up visits as told by your health care provider. This is important. Contact a health care provider if:  You have new symptoms.  Your symptoms get worse. Summary  Hyperhidrosis is a condition in which the body sweats a lot more than normal (excessively).  With primary hyperhidrosis, the sweating usually localizes in one part of your body, such as your underarms, or in a few areas, such as your feet, face, underarms, and hands. It is caused by overactive sweat glands in the affected area.  With secondary hyperhidrosis, the sweating affects your entire body. This is caused by an underlying condition.  Treatment for this condition depends on the kind of hyperhidrosis that you have and the parts of your body that are affected. This information is not intended to replace advice given to you by your health care provider. Make sure you discuss any questions you have with your health care provider. Document Released: 11/02/2005 Document Revised: 09/06/2017 Document Reviewed: 09/06/2017 Elsevier Interactive Patient Education  2019 [employer].

## 2019-03-03 ENCOUNTER — Other Ambulatory Visit: Payer: Self-pay | Admitting: Family Medicine

## 2019-03-03 DIAGNOSIS — G43709 Chronic migraine without aura, not intractable, without status migrainosus: Secondary | ICD-10-CM

## 2019-03-05 ENCOUNTER — Other Ambulatory Visit: Payer: Self-pay | Admitting: Family Medicine

## 2019-03-05 ENCOUNTER — Telehealth: Payer: Self-pay

## 2019-03-05 LAB — NUSWAB VAGINITIS PLUS (VG+)
Atopobium vaginae: HIGH Score — AB
BVAB 2: HIGH Score — AB
Candida albicans, NAA: NEGATIVE
Candida glabrata, NAA: NEGATIVE
Chlamydia trachomatis, NAA: NEGATIVE
Megasphaera 1: HIGH Score — AB
Neisseria gonorrhoeae, NAA: NEGATIVE
Trich vag by NAA: NEGATIVE

## 2019-03-05 MED ORDER — METRONIDAZOLE 500 MG PO TABS: 500.0000 mg | ORAL_TABLET | Freq: Two times a day (BID) | ORAL | 0 refills | Status: AC

## 2019-03-05 NOTE — Telephone Encounter (Signed)
Called and spoke with patient's mother (on hippa) she asked for test results. advised that she was positive for bacterial vaginosis. Advised to take flagyl as prescribed. When over aid in prevention of BV including no douches, no scented products in/near vagina, drink plenty of water, decrease sugar intake, and taking probiotic. Patient verbalized understanding. Thanks!

## 2019-03-05 NOTE — Telephone Encounter (Signed)
-----   Message from Lanae Boast, Leslie sent at 03/05/2019  8:34 AM EDT ----- Please let patient know that she was positive for bacterial vaginosis. This is an imbalance of the pH of your vagina. I am sending a medication for you to take for this. To aid in prevention of BV, remember not to douche, do not use scented products in or near the vagina, drink plenty or water and decrease sugar intake. You can also take a probiotic to help balance out bacteria.

## 2019-03-23 ENCOUNTER — Other Ambulatory Visit: Payer: Self-pay | Admitting: Family Medicine

## 2019-03-31 ENCOUNTER — Other Ambulatory Visit: Payer: Self-pay | Admitting: Family Medicine

## 2019-03-31 DIAGNOSIS — F411 Generalized anxiety disorder: Secondary | ICD-10-CM

## 2019-05-27 ENCOUNTER — Encounter (HOSPITAL_COMMUNITY): Payer: Self-pay

## 2019-05-27 ENCOUNTER — Encounter (HOSPITAL_COMMUNITY): Payer: Self-pay | Admitting: *Deleted

## 2019-06-03 ENCOUNTER — Ambulatory Visit: Payer: Self-pay | Admitting: Family Medicine

## 2019-06-29 ENCOUNTER — Ambulatory Visit: Payer: Self-pay

## 2019-07-06 ENCOUNTER — Ambulatory Visit: Payer: Self-pay

## 2019-08-06 ENCOUNTER — Other Ambulatory Visit: Payer: Self-pay

## 2019-08-06 DIAGNOSIS — F411 Generalized anxiety disorder: Secondary | ICD-10-CM

## 2019-08-06 DIAGNOSIS — G43709 Chronic migraine without aura, not intractable, without status migrainosus: Secondary | ICD-10-CM

## 2019-08-06 MED ORDER — METFORMIN HCL 500 MG PO TABS: 500.0000 mg | ORAL_TABLET | Freq: Every day | ORAL | 1 refills | Status: DC

## 2019-08-06 MED ORDER — SERTRALINE HCL 50 MG PO TABS: 50.0000 mg | ORAL_TABLET | Freq: Every day | ORAL | 1 refills | Status: DC

## 2019-08-06 MED ORDER — TOPIRAMATE 100 MG PO TABS: 100.0000 mg | ORAL_TABLET | Freq: Every day | ORAL | 1 refills | Status: DC

## 2019-11-03 ENCOUNTER — Other Ambulatory Visit: Payer: Self-pay | Admitting: Internal Medicine

## 2019-11-03 DIAGNOSIS — G43709 Chronic migraine without aura, not intractable, without status migrainosus: Secondary | ICD-10-CM

## 2019-11-03 DIAGNOSIS — F411 Generalized anxiety disorder: Secondary | ICD-10-CM

## 2020-02-05 ENCOUNTER — Other Ambulatory Visit: Payer: Self-pay | Admitting: Internal Medicine

## 2020-02-05 DIAGNOSIS — G43709 Chronic migraine without aura, not intractable, without status migrainosus: Secondary | ICD-10-CM

## 2020-02-05 DIAGNOSIS — F411 Generalized anxiety disorder: Secondary | ICD-10-CM

## 2020-04-05 ENCOUNTER — Ambulatory Visit (HOSPITAL_COMMUNITY)
Admission: EM | Admit: 2020-04-05 | Discharge: 2020-04-06 | Disposition: A | Discharge status: Other Acute Inpatient Hospital | Payer: BC Managed Care – PPO | Attending: Psychiatry | Admitting: Psychiatry

## 2020-04-05 ENCOUNTER — Other Ambulatory Visit: Payer: Self-pay

## 2020-04-05 DIAGNOSIS — Z20822 Contact with and (suspected) exposure to covid-19: Secondary | ICD-10-CM | POA: Diagnosis not present

## 2020-04-05 DIAGNOSIS — Z79899 Other long term (current) drug therapy: Secondary | ICD-10-CM | POA: Diagnosis not present

## 2020-04-05 DIAGNOSIS — F332 Major depressive disorder, recurrent severe without psychotic features: Secondary | ICD-10-CM | POA: Insufficient documentation

## 2020-04-05 DIAGNOSIS — F419 Anxiety disorder, unspecified: Secondary | ICD-10-CM | POA: Insufficient documentation

## 2020-04-05 DIAGNOSIS — R45851 Suicidal ideations: Secondary | ICD-10-CM | POA: Diagnosis not present

## 2020-04-05 DIAGNOSIS — Z915 Personal history of self-harm: Secondary | ICD-10-CM | POA: Diagnosis not present

## 2020-04-05 DIAGNOSIS — Z7984 Long term (current) use of oral hypoglycemic drugs: Secondary | ICD-10-CM | POA: Diagnosis not present

## 2020-04-05 NOTE — ED Notes (Signed)
Patient phone and charger stored in locker #29

## 2020-04-06 ENCOUNTER — Encounter (HOSPITAL_COMMUNITY): Payer: Self-pay

## 2020-04-06 ENCOUNTER — Other Ambulatory Visit: Payer: Self-pay

## 2020-04-06 ENCOUNTER — Inpatient Hospital Stay (HOSPITAL_COMMUNITY)
Admission: AD | Admit: 2020-04-06 | Discharge: 2020-04-09 | DRG: 885 | Disposition: A | Discharge status: Home / Self Care | Payer: BC Managed Care – PPO | Source: Ambulatory Visit | Attending: Psychiatry | Admitting: Psychiatry

## 2020-04-06 ENCOUNTER — Encounter (HOSPITAL_COMMUNITY): Payer: Self-pay | Admitting: Psychiatry

## 2020-04-06 DIAGNOSIS — E7800 Pure hypercholesterolemia, unspecified: Secondary | ICD-10-CM | POA: Diagnosis present

## 2020-04-06 DIAGNOSIS — Z8349 Family history of other endocrine, nutritional and metabolic diseases: Secondary | ICD-10-CM

## 2020-04-06 DIAGNOSIS — E282 Polycystic ovarian syndrome: Secondary | ICD-10-CM | POA: Diagnosis present

## 2020-04-06 DIAGNOSIS — F329 Major depressive disorder, single episode, unspecified: Secondary | ICD-10-CM | POA: Diagnosis present

## 2020-04-06 DIAGNOSIS — F4310 Post-traumatic stress disorder, unspecified: Secondary | ICD-10-CM | POA: Diagnosis present

## 2020-04-06 DIAGNOSIS — G43909 Migraine, unspecified, not intractable, without status migrainosus: Secondary | ICD-10-CM | POA: Diagnosis present

## 2020-04-06 DIAGNOSIS — Z62810 Personal history of physical and sexual abuse in childhood: Secondary | ICD-10-CM | POA: Diagnosis not present

## 2020-04-06 DIAGNOSIS — F411 Generalized anxiety disorder: Secondary | ICD-10-CM | POA: Diagnosis not present

## 2020-04-06 DIAGNOSIS — Z8249 Family history of ischemic heart disease and other diseases of the circulatory system: Secondary | ICD-10-CM

## 2020-04-06 DIAGNOSIS — R45851 Suicidal ideations: Secondary | ICD-10-CM

## 2020-04-06 DIAGNOSIS — F332 Major depressive disorder, recurrent severe without psychotic features: Principal | ICD-10-CM | POA: Diagnosis present

## 2020-04-06 DIAGNOSIS — Z79899 Other long term (current) drug therapy: Secondary | ICD-10-CM | POA: Diagnosis not present

## 2020-04-06 DIAGNOSIS — Z7984 Long term (current) use of oral hypoglycemic drugs: Secondary | ICD-10-CM | POA: Diagnosis not present

## 2020-04-06 DIAGNOSIS — F419 Anxiety disorder, unspecified: Secondary | ICD-10-CM | POA: Diagnosis not present

## 2020-04-06 DIAGNOSIS — Z915 Personal history of self-harm: Secondary | ICD-10-CM | POA: Diagnosis not present

## 2020-04-06 DIAGNOSIS — Z833 Family history of diabetes mellitus: Secondary | ICD-10-CM | POA: Diagnosis not present

## 2020-04-06 DIAGNOSIS — Z20822 Contact with and (suspected) exposure to covid-19: Secondary | ICD-10-CM | POA: Diagnosis not present

## 2020-04-06 LAB — URINALYSIS, COMPLETE (UACMP) WITH MICROSCOPIC
Bilirubin Urine: NEGATIVE
Glucose, UA: NEGATIVE mg/dL
Hgb urine dipstick: NEGATIVE
Ketones, ur: NEGATIVE mg/dL
Nitrite: NEGATIVE
Protein, ur: NEGATIVE mg/dL
Specific Gravity, Urine: 1.021 (ref 1.005–1.030)
pH: 5 (ref 5.0–8.0)

## 2020-04-06 LAB — POCT URINALYSIS DIP (DEVICE)
Bilirubin Urine: NEGATIVE
Glucose, UA: NEGATIVE mg/dL
Hgb urine dipstick: NEGATIVE
Ketones, ur: NEGATIVE mg/dL
Leukocytes,Ua: NEGATIVE
Nitrite: NEGATIVE
Protein, ur: NEGATIVE mg/dL
Specific Gravity, Urine: >=1.03 (ref 1.005–1.030)
Urobilinogen, UA: 0.2 mg/dL (ref 0.0–1.0)
pH: 6 (ref 5.0–8.0)

## 2020-04-06 LAB — COMPREHENSIVE METABOLIC PANEL
ALT: 76 U/L — ABNORMAL HIGH (ref 0–44)
AST: 40 U/L (ref 15–41)
Albumin: 3.9 g/dL (ref 3.5–5.0)
Alkaline Phosphatase: 92 U/L (ref 38–126)
Anion gap: 12 (ref 5–15)
BUN: 12 mg/dL (ref 6–20)
CO2: 23 mmol/L (ref 22–32)
Calcium: 9.1 mg/dL (ref 8.9–10.3)
Chloride: 105 mmol/L (ref 98–111)
Creatinine, Ser: 1.03 mg/dL — ABNORMAL HIGH (ref 0.44–1.00)
GFR calc Af Amer: >60 mL/min (ref >60)
GFR calc non Af Amer: >60 mL/min (ref >60)
Glucose, Bld: 96 mg/dL (ref 70–99)
Potassium: 3.7 mmol/L (ref 3.5–5.1)
Sodium: 140 mmol/L (ref 135–145)
Total Bilirubin: 1.5 mg/dL — ABNORMAL HIGH (ref 0.3–1.2)
Total Protein: 7 g/dL (ref 6.5–8.1)

## 2020-04-06 LAB — LIPID PANEL
Cholesterol: 262 mg/dL — ABNORMAL HIGH (ref 0–200)
HDL: 36 mg/dL — ABNORMAL LOW (ref >40)
LDL Cholesterol: 192 mg/dL — ABNORMAL HIGH (ref 0–99)
Total CHOL/HDL Ratio: 7.3 RATIO
Triglycerides: 169 mg/dL — ABNORMAL HIGH (ref <150)
VLDL: 34 mg/dL (ref 0–40)

## 2020-04-06 LAB — TSH: TSH: 3.944 u[IU]/mL (ref 0.350–4.500)

## 2020-04-06 LAB — POC SARS CORONAVIRUS 2 AG: SARS Coronavirus 2 Ag: NEGATIVE

## 2020-04-06 LAB — SARS CORONAVIRUS 2 BY RT PCR (HOSPITAL ORDER, PERFORMED IN ~~LOC~~ HOSPITAL LAB): SARS Coronavirus 2: NEGATIVE

## 2020-04-06 LAB — HEMOGLOBIN A1C
Hgb A1c MFr Bld: 5.6 % (ref 4.8–5.6)
Mean Plasma Glucose: 114.02 mg/dL

## 2020-04-06 LAB — ETHANOL: Alcohol, Ethyl (B): <10 mg/dL (ref <10)

## 2020-04-06 LAB — GLUCOSE, CAPILLARY: Glucose-Capillary: 86 mg/dL (ref 70–99)

## 2020-04-06 LAB — PREGNANCY, URINE: Preg Test, Ur: NEGATIVE

## 2020-04-06 LAB — MAGNESIUM: Magnesium: 2.1 mg/dL (ref 1.7–2.4)

## 2020-04-06 MED ORDER — ACETAMINOPHEN 325 MG PO TABS: 650.0000 mg | ORAL_TABLET | Freq: Four times a day (QID) | ORAL | Status: DC | PRN

## 2020-04-06 MED ORDER — METFORMIN HCL 500 MG PO TABS
500.0000 mg | ORAL_TABLET | Freq: Every day | ORAL | Status: DC
  Administered 2020-04-07 – 2020-04-09 (×3): 500 mg via ORAL
  Filled 2020-04-06 (×6): qty 1

## 2020-04-06 MED ORDER — HYDROXYZINE HCL 25 MG PO TABS
25.0000 mg | ORAL_TABLET | Freq: Three times a day (TID) | ORAL | Status: DC | PRN
  Administered 2020-04-06: 25 mg via ORAL
  Filled 2020-04-06: qty 1

## 2020-04-06 MED ORDER — SERTRALINE HCL 50 MG PO TABS
50.0000 mg | ORAL_TABLET | Freq: Every day | ORAL | Status: DC
  Administered 2020-04-06: 50 mg via ORAL
  Filled 2020-04-06: qty 1

## 2020-04-06 MED ORDER — TRAZODONE HCL 50 MG PO TABS: 50.0000 mg | ORAL_TABLET | Freq: Every evening | ORAL | Status: DC | PRN

## 2020-04-06 MED ORDER — CLOTRIMAZOLE 1 % EX CREA
TOPICAL_CREAM | Freq: Two times a day (BID) | CUTANEOUS | Status: DC
  Filled 2020-04-06 (×2): qty 15

## 2020-04-06 MED ORDER — SERTRALINE HCL 50 MG PO TABS
50.0000 mg | ORAL_TABLET | Freq: Every day | ORAL | Status: DC
  Administered 2020-04-07 – 2020-04-09 (×3): 50 mg via ORAL
  Filled 2020-04-06 (×6): qty 1

## 2020-04-06 MED ORDER — TOPIRAMATE 25 MG PO TABS
25.0000 mg | ORAL_TABLET | Freq: Every day | ORAL | Status: DC
  Administered 2020-04-07 – 2020-04-09 (×3): 25 mg via ORAL
  Filled 2020-04-06 (×5): qty 1

## 2020-04-06 MED ORDER — LORAZEPAM 0.5 MG PO TABS: 0.5000 mg | ORAL_TABLET | Freq: Three times a day (TID) | ORAL | Status: DC | PRN

## 2020-04-06 NOTE — ED Notes (Signed)
Pt sitting up on side of bed. No acute distress noted. Safety maintained.

## 2020-04-06 NOTE — Progress Notes (Signed)
Patient shared in group that she feels "more at peace" today since she has experienced fewer nightmares. Her goal for tomorrow is to "wake up in peace".

## 2020-04-06 NOTE — ED Notes (Signed)
Patient had breakfast cereal, milk, fruit cup, juice.
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
## 2020-04-06 NOTE — ED Notes (Signed)
Completed urinalysis, per RN request.

## 2020-04-06 NOTE — ED Notes (Signed)
Pt sitting up eating lunch. No acute distress noted. Safety maintained.

## 2020-04-06 NOTE — Discharge Instructions (Addendum)
Transfer to [facility] 

## 2020-04-06 NOTE — ED Notes (Signed)
Attempted to call report to [facility]. Per Octavia MHT, asked to call back after shift change.
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
## 2020-04-06 NOTE — ED Notes (Signed)
Patient is resting comfortably. 
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
## 2020-04-06 NOTE — Progress Notes (Signed)
  Admission DAR NOTE:  Pt received from [facility] via [employer] [facility]. Alert and oriented x 3 denies pain or discomfort. Reported feeling suicidal for the past 7 days and very tearful. "I feel I can not do it any more, I told my mum how I had been feeling that's how I ended up here" I never want to come to hopsital because I have had a bad experience before I like to take control on my own" " I have been off my medication because I cannot afford it:" Pt reports hx of depression and anxiety. Chloe Pena also endorses SI with plan on overdose on medications. "I was hoping for my mum to leave the house and then take her medication and stop this struggle" She also mentioned that she usually do her garden and listen to music as a coping skill. She currently verbally contracted for safety.   Emotional support and availability offered to Patient as needed. Skin assessment done and noted to have redness under breast and under her abd. Provider notified. Acne scabs noted all over Patient body.  Belongings searched per protocol.  Items deemed contraband secured in locker. Unit orientation and routine discussed, Care Plan reviewed. Patient verbalized understanding. Fluids and food offered, tolerated well. Q15 minutes safety checks initiated without self harm gestures.
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
## 2020-04-06 NOTE — ED Notes (Signed)
RN gave report to accepting RN, Legrand Como of pt's admission to room 304-2. Transport to be called after 1230. Pt made aware of transport and verbalize understanding. Continue to endorse SI but denies plan. Pt verbally contracts for safety.

## 2020-04-06 NOTE — ED Notes (Signed)
Pt. BP TTS informed.Marland Kitchen
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
## 2020-04-06 NOTE — ED Notes (Signed)
Pt discharged to [facility] via [facility]. Pt cooperative. Accepting facility aware of pt's arrival. Safety maintained.

## 2020-04-06 NOTE — ED Notes (Signed)
Pt asleep but easily aroused. Voices no concerns at present. RN gave pt urine cup for specimen. Pt in bathroom providing sample.

## 2020-04-06 NOTE — ED Notes (Signed)
Per Larose Kells, patient accepted to [facility] Pending negative COVID test Arrival time is after [place], RN, informed of acceptance. Report [id number] Talbot Grumbling, NP, patient meets inpatient criteria. TTS to secure placement.
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
## 2020-04-06 NOTE — BH Assessment (Signed)
Patient evaluated by Talbot Grumbling, NP and recommended for psychiatric Inpatient admission when medically cleared.  Per am bed meeting, patient accepted to [id number]. The accepting provider is Talbot Grumbling, NP and the attending provider is Dr. Mallie Darting. Nurse report [phone].

## 2020-04-07 ENCOUNTER — Ambulatory Visit: Payer: Self-pay | Admitting: Nurse Practitioner

## 2020-04-07 DIAGNOSIS — F4310 Post-traumatic stress disorder, unspecified: Secondary | ICD-10-CM | POA: Diagnosis present

## 2020-04-07 DIAGNOSIS — F329 Major depressive disorder, single episode, unspecified: Secondary | ICD-10-CM | POA: Diagnosis present

## 2020-04-07 DIAGNOSIS — F411 Generalized anxiety disorder: Secondary | ICD-10-CM | POA: Diagnosis present

## 2020-04-07 DIAGNOSIS — F332 Major depressive disorder, recurrent severe without psychotic features: Secondary | ICD-10-CM | POA: Insufficient documentation

## 2020-04-07 NOTE — Progress Notes (Signed)
Patient rated her day as a 6 out of a possible 10 since she is bored. She normally gardens or reads to cure her boredom. The patient's anxiety was high today due to her boredom.

## 2020-04-07 NOTE — Progress Notes (Signed)
Patient remains guarded on approach, she remains sad and depressed, she had minimal interaction with peers and staff. She appears to be in bed resting quietly at this time.
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
## 2020-04-07 NOTE — Progress Notes (Signed)
Pt denies SI/HI/AVH.  Reported that she feels "so much better" now that she's started back on her medications.  Pt said she did not take medications for a month and a half.  Pt smiled when talking with RN and pt was engaged in discussion.  Pt observed sitting in day room working on a puzzle.  Pt taking medications without incident and and no adverse reactions were noted.Q 15 min safety checks remain in place.
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
## 2020-04-07 NOTE — Plan of Care (Signed)
Patient guarded , working towards medication medication stabilization.
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
## 2020-04-08 ENCOUNTER — Telehealth: Payer: Self-pay | Admitting: Family Medicine

## 2020-04-08 ENCOUNTER — Telehealth: Payer: Self-pay | Admitting: Nurse Practitioner

## 2020-04-08 DIAGNOSIS — R45851 Suicidal ideations: Secondary | ICD-10-CM

## 2020-04-08 DIAGNOSIS — F332 Major depressive disorder, recurrent severe without psychotic features: Secondary | ICD-10-CM | POA: Diagnosis present

## 2020-04-08 MED ORDER — MAGNESIUM HYDROXIDE 400 MG/5ML PO SUSP: 30.0000 mL | Freq: Every day | ORAL | Status: DC | PRN

## 2020-04-08 MED ORDER — SUMATRIPTAN SUCCINATE 50 MG PO TABS: 100.0000 mg | ORAL_TABLET | Freq: Once | ORAL | Status: DC | PRN

## 2020-04-08 MED ORDER — ALUM & MAG HYDROXIDE-SIMETH 200-200-20 MG/5ML PO SUSP: 30.0000 mL | ORAL | Status: DC | PRN

## 2020-04-08 MED ORDER — SERTRALINE HCL 50 MG PO TABS
50.0000 mg | ORAL_TABLET | Freq: Every day | ORAL | Status: DC
  Filled 2020-04-08: qty 1

## 2020-04-08 MED ORDER — ACETAMINOPHEN 325 MG PO TABS: 650.0000 mg | ORAL_TABLET | Freq: Four times a day (QID) | ORAL | Status: DC | PRN

## 2020-04-08 MED ORDER — HYDROXYZINE HCL 25 MG PO TABS: 25.0000 mg | ORAL_TABLET | Freq: Three times a day (TID) | ORAL | Status: DC | PRN

## 2020-04-08 MED ORDER — TRAZODONE HCL 50 MG PO TABS
50.0000 mg | ORAL_TABLET | Freq: Every evening | ORAL | Status: DC | PRN
  Filled 2020-04-08: qty 1

## 2020-04-08 MED ORDER — SUMATRIPTAN SUCCINATE 50 MG PO TABS
50.0000 mg | ORAL_TABLET | Freq: Once | ORAL | Status: AC
  Administered 2020-04-08: 50 mg via ORAL
  Filled 2020-04-08 (×2): qty 1

## 2020-04-08 NOTE — Telephone Encounter (Signed)
Pt was called to schedule appointment no answer no messge was left

## 2020-04-08 NOTE — Tx Team (Cosign Needed)
Interdisciplinary Treatment and Diagnostic Plan Update  04/08/2020 Time of Session: 1051a Chloe Pena MRN: [id number]  Principal Diagnosis: Major depressive disorder, single episode  Secondary Diagnoses: Principal Problem:   Major depressive disorder, single episode Active Problems:   PTSD (post-traumatic stress disorder)   Generalized anxiety disorder   Severe episode of recurrent major depressive disorder, without psychotic features ([place])   MDD (major depressive disorder), recurrent severe, without psychosis ([place])   Current Medications:  Current Facility-Administered Medications  Medication Dose Route Frequency Provider Last Rate Last Admin  . alum & mag hydroxide-simeth (MAALOX/MYLANTA) 200-200-20 MG/5ML suspension 30 mL  30 mL Oral Q4H PRN Connye Burkitt, NP      . clotrimazole (LOTRIMIN) 1 % cream   Topical BID Lindell Spar I, NP   Given at 04/06/20 1702  . LORazepam (ATIVAN) tablet 0.5 mg  0.5 mg Oral Q8H PRN Cobos, Fernando A, MD      . magnesium hydroxide (MILK OF MAGNESIA) suspension 30 mL  30 mL Oral Daily PRN Connye Burkitt, NP      . metFORMIN (GLUCOPHAGE) tablet 500 mg  500 mg Oral Q breakfast Cobos, Myer Peer, MD   500 mg at 04/08/20 0759  . sertraline (ZOLOFT) tablet 50 mg  50 mg Oral Daily Cobos, Myer Peer, MD   50 mg at 04/08/20 0759  . topiramate (TOPAMAX) tablet 25 mg  25 mg Oral Daily Cobos, Myer Peer, MD   25 mg at 04/08/20 0759  . traZODone (DESYREL) tablet 50 mg  50 mg Oral QHS PRN Connye Burkitt, NP       PTA Medications: Medications Prior to Admission  Medication Sig Dispense Refill Last Dose  . metFORMIN (GLUCOPHAGE) 500 MG tablet Take 1 tablet (500 mg total) by mouth daily with breakfast. 90 tablet 1 Past Month at Unknown time  . etonogestrel (NEXPLANON) 68 MG IMPL implant 1 each by Subdermal route once.     . sertraline (ZOLOFT) 50 MG tablet Take 1 tablet (50 mg total) by mouth daily. 90 tablet 1   . topiramate (TOPAMAX) 100 MG tablet Take 1  tablet (100 mg total) by mouth at bedtime. 90 tablet 1     Patient Stressors:    Patient Strengths:    Treatment Modalities: Medication Management, Group therapy, Case management,  1 to 1 session with clinician, Psychoeducation, Recreational therapy.   Physician Treatment Plan for Primary Diagnosis: Major depressive disorder, single episode Long Term Goal(s): Improvement in symptoms so as ready for discharge Improvement in symptoms so as ready for discharge   Short Term Goals: Ability to identify changes in lifestyle to reduce recurrence of condition will improve Ability to verbalize feelings will improve Ability to disclose and discuss suicidal ideas Ability to demonstrate self-control will improve Ability to identify and develop effective coping behaviors will improve Ability to maintain clinical measurements within normal limits will improve Compliance with prescribed medications will improve Ability to identify changes in lifestyle to reduce recurrence of condition will improve Ability to verbalize feelings will improve Ability to disclose and discuss suicidal ideas Ability to demonstrate self-control will improve Ability to identify and develop effective coping behaviors will improve Ability to maintain clinical measurements within normal limits will improve Compliance with prescribed medications will improve  Medication Management: Evaluate patient's response, side effects, and tolerance of medication regimen.  Therapeutic Interventions: 1 to 1 sessions, Unit Group sessions and Medication administration.  Evaluation of Outcomes: Not Met  Physician Treatment Plan for Secondary Diagnosis: Principal Problem:  Major depressive disorder, single episode Active Problems:   PTSD (post-traumatic stress disorder)   Generalized anxiety disorder   Severe episode of recurrent major depressive disorder, without psychotic features ([place])   MDD (major depressive disorder), recurrent  severe, without psychosis ([place])  Long Term Goal(s): Improvement in symptoms so as ready for discharge Improvement in symptoms so as ready for discharge   Short Term Goals: Ability to identify changes in lifestyle to reduce recurrence of condition will improve Ability to verbalize feelings will improve Ability to disclose and discuss suicidal ideas Ability to demonstrate self-control will improve Ability to identify and develop effective coping behaviors will improve Ability to maintain clinical measurements within normal limits will improve Compliance with prescribed medications will improve Ability to identify changes in lifestyle to reduce recurrence of condition will improve Ability to verbalize feelings will improve Ability to disclose and discuss suicidal ideas Ability to demonstrate self-control will improve Ability to identify and develop effective coping behaviors will improve Ability to maintain clinical measurements within normal limits will improve Compliance with prescribed medications will improve     Medication Management: Evaluate patient's response, side effects, and tolerance of medication regimen.  Therapeutic Interventions: 1 to 1 sessions, Unit Group sessions and Medication administration.  Evaluation of Outcomes: Not Met   RN Treatment Plan for Primary Diagnosis: Major depressive disorder, single episode Long Term Goal(s): Knowledge of disease and therapeutic regimen to maintain health will improve  Short Term Goals: Ability to remain free from injury will improve, Ability to verbalize frustration and anger appropriately will improve, Ability to demonstrate self-control, Ability to participate in decision making will improve, Ability to verbalize feelings will improve, Ability to disclose and discuss suicidal ideas, Ability to identify and develop effective coping behaviors will improve and Compliance with prescribed medications will improve  Medication  Management: RN will administer medications as ordered by provider, will assess and evaluate patient's response and provide education to patient for prescribed medication. RN will report any adverse and/or side effects to prescribing provider.  Therapeutic Interventions: 1 on 1 counseling sessions, Psychoeducation, Medication administration, Evaluate responses to treatment, Monitor vital signs and CBGs as ordered, Perform/monitor CIWA, COWS, AIMS and Fall Risk screenings as ordered, Perform wound care treatments as ordered.  Evaluation of Outcomes: Not Met   LCSW Treatment Plan for Primary Diagnosis: Major depressive disorder, single episode Long Term Goal(s): Safe transition to appropriate next level of care at discharge, Engage patient in therapeutic group addressing interpersonal concerns.  Short Term Goals: Engage patient in aftercare planning with referrals and resources, Increase social support, Increase ability to appropriately verbalize feelings, Increase emotional regulation, Facilitate acceptance of mental health diagnosis and concerns, Facilitate patient progression through stages of change regarding substance use diagnoses and concerns, Identify triggers associated with mental health/substance abuse issues and Increase skills for wellness and recovery  Therapeutic Interventions: Assess for all discharge needs, 1 to 1 time with Social worker, Explore available resources and support systems, Assess for adequacy in community support network, Educate family and significant other(s) on suicide prevention, Complete Psychosocial Assessment, Interpersonal group therapy.  Evaluation of Outcomes: Not Met   Progress in Treatment: Attending groups: No. Participating in groups: No. Taking medication as prescribed: Yes. Toleration medication: Yes. Family/Significant other contact made: Yes, individual(s) contacted:  Katina Degree, mother  Patient understands diagnosis: Yes. Discussing patient  identified problems/goals with staff: No. Medical problems stabilized or resolved: Yes. Denies suicidal/homicidal ideation: Yes. Issues/concerns per patient self-inventory: No. Other:   New problem(s) identified: No, Describe:  no  New Short Term/Long Term Goal(s):  Patient Goals:  Pt did not attend  Discharge Plan or Barriers:   Reason for Continuation of Hospitalization: Medication stabilization  Estimated Length of Stay:  Attendees: Patient: Pt did not attend 04/08/2020 10:51 AM  Physician:  04/08/2020 10:51 AM  Nursing:  04/08/2020 10:51 AM  RN Care Manager: 04/08/2020 10:51 AM  Social Worker: Macon Large 04/08/2020 10:51 AM  Recreational Therapist:  04/08/2020 10:51 AM  Other:  04/08/2020 10:51 AM  Other:  04/08/2020 10:51 AM  Other: 04/08/2020 10:51 AM    Scribe for Treatment Team: Bethann Berkshire, LCSW 04/08/2020 10:51 AM
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
## 2020-04-08 NOTE — Telephone Encounter (Signed)
Pt would like to be contacted to rescheduled appt they missed
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
## 2020-04-08 NOTE — Progress Notes (Signed)
   04/08/20 0046  Psych Admission Type (Psych Patients Only)  Admission Status Voluntary  Psychosocial Assessment  Patient Complaints Anxiety  Eye Contact Brief  Facial Expression Sad;Flat  Affect Flat;Sad;Depressed  Speech Logical/coherent  Interaction Assertive  Motor Activity Slow  Appearance/Hygiene Unremarkable  Behavior Characteristics Cooperative  Mood Depressed  Thought Process  Coherency WDL  Content WDL  Delusions None reported or observed  Perception WDL  Hallucination None reported or observed  Judgment Impaired  Confusion None  Danger to Self  Current suicidal ideation? Denies  Self-Injurious Behavior No self-injurious ideation or behavior indicators observed or expressed   Agreement Not to Harm Self Yes  Description of Agreement verbal  Danger to Others  Danger to Others None reported or observed

## 2020-04-09 LAB — PREGNANCY, URINE: Preg Test, Ur: NEGATIVE

## 2020-04-09 MED ORDER — TOPIRAMATE 25 MG PO TABS: 25.0000 mg | ORAL_TABLET | Freq: Every day | ORAL | 0 refills | Status: DC

## 2020-04-09 MED ORDER — METFORMIN HCL 500 MG PO TABS: 500.0000 mg | ORAL_TABLET | Freq: Every day | ORAL | 0 refills | Status: DC

## 2020-04-09 MED ORDER — SERTRALINE HCL 50 MG PO TABS: 50.0000 mg | ORAL_TABLET | Freq: Every day | ORAL | 0 refills | Status: DC

## 2020-04-09 NOTE — Progress Notes (Signed)
D. Pt presents as flat, but brightens during interactions- is calm and cooperative- visible in the dayroom attending groups. Pt rates her depression, hopelessness and anxiety a 0/0/6, respectively. Pt writes that her goal today is "being able to go home". Pt currently denies SI/HI and AVH A.  Pt supported emotionally and encouraged to express concerns and ask questions.   R. Pt remains safe with 15 minute checks. Will continue POC.

## 2020-04-09 NOTE — BHH Group Notes (Signed)
LCSW Group Therapy Note  Date/Time:  04/09/2020   10:00AM-11:00AM  Type of Therapy and Topic:  Group Therapy:  Fears and Unhealthy/Healthy Coping Skills  Participation Level:  Active   Description of Group:  The focus of this group was to discuss some of the prevalent fears that patients experience, and to identify the commonalities among group members.  A fun exercise was used to initiate the discussion, followed by writing on the white board a group-generated list of unhealthy coping and healthy coping techniques to deal with each fear.    Therapeutic Goals: Patient will be able to distinguish between healthy and unhealthy coping skills Patient will identify and describe 3 fears they experience Patient will identify one positive coping strategy for each fear they experience Patient will respond empathetically to peers' statements regarding fears they experience  Summary of Patient Progress:  The patient was very interactive throughout group, sharing frequently from her personal experience.  She shared opinions at times that were countered by another patient, and did not back down from her position.  CSW quickly redirected the discussion and this did not bother patient at all.  She was out for part of the group seeing a provider.  Therapeutic Modalities Cognitive Behavioral Therapy Motivational Interviewing  [name], LCSW

## 2020-04-09 NOTE — Progress Notes (Signed)
  [facility] Adult Case Management Discharge Plan :  Will you be returning to the same living situation after discharge:  Yes,  with mother At discharge, do you have transportation home?: Yes,  mother or friends Do you have the ability to pay for your medications: Yes,  insurance  Release of information consent forms completed and emailed to Medical Records, then turned in to Medical Records by CSW.   Patient to Follow up at:  Follow-up [place]. Call on 04/26/2020.   Why: You are scheduled for a therapy appointment on 04/26/20 at 7:30 am. You also have a appointment on 05/10/20 at 9:30 am for medication management.  These appointments will be Virtual via Zoom.   PLEASE CALL THIS PROVIDER UPON DISCHARGE. Contact information: [address] [place] [address] [phone]               Next level of care provider has access to [place] and Suicide Prevention discussed: Yes,  with mother     Has patient been referred to the Quitline?: N/A patient is not a smoker  Patient has been referred for addiction treatment: N/A  Maretta Los, LCSW 04/09/2020, 12:26 PM

## 2020-04-09 NOTE — Progress Notes (Signed)
   04/08/20 2155  COVID-19 Daily Checkoff  Have you had a fever (temp > 37.80C/100F)  in the past 24 hours?  No  COVID-19 EXPOSURE  Have you traveled outside the state in the past 14 days? No  Have you been in contact with someone with a confirmed diagnosis of COVID-19 or PUI in the past 14 days without wearing appropriate PPE? No  Have you been living in the same home as a person with confirmed diagnosis of COVID-19 or a PUI (household contact)? No  Have you been diagnosed with COVID-19? No

## 2020-04-09 NOTE — Progress Notes (Signed)
   04/08/20 2155  Psych Admission Type (Psych Patients Only)  Admission Status Voluntary  Psychosocial Assessment  Patient Complaints Anxiety;Depression;Sadness;Worrying  Eye Contact Brief  Facial Expression Anxious;Worried;Sad  Affect Anxious;Preoccupied;Depressed;Sad  Speech Logical/coherent  Interaction Assertive;Childlike;Defensive  Motor Activity Other (Comment) (WDL)  Appearance/Hygiene Unremarkable  Behavior Characteristics Cooperative;Appropriate to situation;Anxious;Calm  Mood Depressed;Anxious;Sad;Preoccupied;Threatening  Thought Process  Coherency Concrete thinking  Content Blaming others  Delusions None reported or observed  Perception WDL  Hallucination None reported or observed  Judgment Poor  Confusion None  Danger to Self  Current suicidal ideation? Denies  Danger to Others  Danger to Others None reported or observed

## 2020-04-09 NOTE — BHH Group Notes (Signed)
Adult Psychoeducational Group Note  Date:  04/09/2020 Time:  12:24 PM  Group Topic/Focus:  Goals Group:   The focus of this group is to help patients establish daily goals to achieve during treatment and discuss how the patient can incorporate goal setting into their daily lives to aide in recovery.  Participation Level:  Active  Participation Quality:  Appropriate  Affect:  Flat  Cognitive:  Oriented  Insight: Improving  Engagement in Group:  Engaged  Modes of Intervention:  Discussion, Education and Support  Additional Comments:  Pt states her energy level is an 8/10 and her goal is to say 5 positives about herself today.  Took part in the discussion of setting goals and how to do it.  Paulino Rily 04/09/2020, 12:24 PM

## 2020-04-09 NOTE — Progress Notes (Signed)
Pt discharged to lobby. Pt was stable and appreciative at that time. All papers and prescriptions were given and valuables returned. Verbal understanding expressed. Denies SI/HI and A/VH. Pt given opportunity to express concerns and ask questions.  
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
## 2021-03-13 ENCOUNTER — Ambulatory Visit: Payer: Self-pay | Attending: Family Medicine | Admitting: Family Medicine

## 2021-03-13 ENCOUNTER — Encounter: Payer: Self-pay | Admitting: Clinical

## 2021-03-13 ENCOUNTER — Other Ambulatory Visit: Payer: Self-pay

## 2021-03-13 ENCOUNTER — Encounter: Payer: Self-pay | Admitting: Family Medicine

## 2021-03-13 ENCOUNTER — Ambulatory Visit: Payer: Self-pay | Attending: Family Medicine | Admitting: Clinical

## 2021-03-13 VITALS — BP 106/68 | HR 76 | Wt 356.2 lb

## 2021-03-13 DIAGNOSIS — M546 Pain in thoracic spine: Secondary | ICD-10-CM

## 2021-03-13 DIAGNOSIS — F419 Anxiety disorder, unspecified: Secondary | ICD-10-CM

## 2021-03-13 DIAGNOSIS — F40248 Other situational type phobia: Secondary | ICD-10-CM

## 2021-03-13 DIAGNOSIS — T1490XA Injury, unspecified, initial encounter: Secondary | ICD-10-CM

## 2021-03-13 DIAGNOSIS — Z131 Encounter for screening for diabetes mellitus: Secondary | ICD-10-CM

## 2021-03-13 DIAGNOSIS — E282 Polycystic ovarian syndrome: Secondary | ICD-10-CM

## 2021-03-13 DIAGNOSIS — M545 Low back pain, unspecified: Secondary | ICD-10-CM

## 2021-03-13 DIAGNOSIS — F332 Major depressive disorder, recurrent severe without psychotic features: Secondary | ICD-10-CM

## 2021-03-13 DIAGNOSIS — L700 Acne vulgaris: Secondary | ICD-10-CM

## 2021-03-13 DIAGNOSIS — Z975 Presence of (intrauterine) contraceptive device: Secondary | ICD-10-CM

## 2021-03-13 DIAGNOSIS — Z13228 Encounter for screening for other metabolic disorders: Secondary | ICD-10-CM

## 2021-03-13 LAB — POCT GLYCOSYLATED HEMOGLOBIN (HGB A1C): Hemoglobin A1C: 5.6 % (ref 4.0–5.6)

## 2021-03-13 MED ORDER — TRETINOIN 0.025 % EX CREA: TOPICAL_CREAM | Freq: Every day | CUTANEOUS | 1 refills | Status: DC

## 2021-03-13 MED ORDER — LIDOCAINE 5 % EX PTCH: 1.0000 | MEDICATED_PATCH | CUTANEOUS | 3 refills | Status: DC

## 2021-03-13 MED ORDER — METFORMIN HCL 500 MG PO TABS: 500.0000 mg | ORAL_TABLET | Freq: Every day | ORAL | 6 refills | Status: DC

## 2021-03-13 MED ORDER — DULOXETINE HCL 60 MG PO CPEP: 60.0000 mg | ORAL_CAPSULE | Freq: Every day | ORAL | 3 refills | Status: DC

## 2021-03-13 MED ORDER — CLINDAMYCIN PHOS-BENZOYL PEROX 1-5 % EX GEL: Freq: Two times a day (BID) | CUTANEOUS | 1 refills | Status: DC

## 2021-03-13 NOTE — Progress Notes (Signed)
Subjective:  Patient ID: Chloe Pena, female    DOB: 1996/09/05  Age: 25 y.o. MRN: [id number]  CC: New Patient (Initial Visit)   HPI Chloe Pena is a 25 year old female with a history of PCOS,, morbid obesity who presents to establish care.  Interval History: For the past 1 month her mid back has been hurting and severity depends on what she has done during the day ranging from a 4-8/10. She looked up some back exercises and has been doing some stretches with no relief. Pain makes her freese as it radiates outwards. After prolonged standing when she goes to sit, she feels the pain to be more intense.  She also has to assist her mom who is disabled to get out of bed as she is her major caregiver. Tylenol does not work for her; Ibuprofen 'knocks her out'. She is not able to recall her experience with Lidoderm patches  Should be on Metformin for PCOS but has run out. She has acne on her face and it hurts deep in her face and lesions also have discharges. Also has facial hair .  Unable to tolerate OCPs hence she was placed on Norplant. She has Norplant for a while  and would like it removed.  Endorses being depressed and was on Zoloft in the past.  She is unhappy about her weight and then breaks down crying.  People have made comments to her like " if you do not lose weight you will end up like your mother."  This has her status she has tried to eat healthy and did the best she can to lose weight.  She complains of multiple traumatic episodes in the past which she does not go into detail about.  Informs me she would like to burn every religious book as she is unahappy with any religion.  She has been hospitalized for previous suicidal attempt. Past Medical History:  Diagnosis Date   Anxiety    no meds   Asthma    as a child, no inhaler, no problems as adult   Migraine    otc med prn   PCOS (polycystic ovarian syndrome)     Past Surgical History:  Procedure Laterality  Date   LAPAROSCOPIC OVARIAN CYSTECTOMY N/A 02/11/2018   Procedure: LAPAROSCOPIC OVARIAN CYSTECTOMY;  Surgeon: Sloan Leiter, MD;  Location: [facility];  Service: Gynecology;  Laterality: N/A;   WISDOM TOOTH EXTRACTION      Family History  Problem Relation Age of Onset   Migraines Mother    GER disease Mother    Heart disease Mother    Heart failure Mother    Cancer Maternal Grandmother    Hypertension Maternal Grandmother    Diabetes Maternal Grandmother    Diabetes Father    Thyroid disease Brother     No Known Allergies  Outpatient Medications Prior to Visit  Medication Sig Dispense Refill   metFORMIN (GLUCOPHAGE) 500 MG tablet Take 1 tablet (500 mg total) by mouth daily with breakfast. (Patient not taking: Reported on 03/13/2021) 30 tablet 0   sertraline (ZOLOFT) 50 MG tablet Take 1 tablet (50 mg total) by mouth daily. (Patient not taking: Reported on 03/13/2021) 30 tablet 0   topiramate (TOPAMAX) 25 MG tablet Take 1 tablet (25 mg total) by mouth daily. (Patient not taking: Reported on 03/13/2021) 30 tablet 0   No facility-administered medications prior to visit.     ROS Review of Systems  Constitutional:  Negative for activity change, appetite change  and fatigue.  HENT:  Negative for congestion, sinus pressure and sore throat.   Eyes:  Negative for visual disturbance.  Respiratory:  Negative for cough, chest tightness, shortness of breath and wheezing.   Cardiovascular:  Negative for chest pain and palpitations.  Gastrointestinal:  Negative for abdominal distention, abdominal pain and constipation.  Endocrine: Negative for polydipsia.  Genitourinary:  Negative for dysuria and frequency.  Musculoskeletal:  Positive for back pain. Negative for arthralgias.  Skin:  Positive for rash.  Neurological:  Negative for tremors, light-headedness and numbness.  Hematological:  Does not bruise/bleed easily.  Psychiatric/Behavioral:  Positive for dysphoric mood and suicidal ideas.  Negative for agitation and behavioral problems.    Objective:  BP 106/68   Pulse 76   Wt (!) 356 lb 3.2 oz (161.6 kg)   SpO2 98%   BMI 57.49 kg/m   BP/Weight 03/13/2021 03/02/2019 03/25/6282  Systolic BP 662 947 654  Diastolic BP 68 70 81  Wt. (Lbs) 356.2 352 354  BMI 57.49 58.58 58.91  Some encounter information is confidential and restricted. Go to Review Flowsheets activity to see all data.      Physical Exam Constitutional:      Appearance: She is well-developed. She is obese.  Neck:     Vascular: No JVD.  Cardiovascular:     Rate and Rhythm: Normal rate.     Heart sounds: Normal heart sounds. No murmur heard. Pulmonary:     Effort: Pulmonary effort is normal.     Breath sounds: Normal breath sounds. No wheezing or rales.  Chest:     Chest wall: No tenderness.  Abdominal:     General: Bowel sounds are normal. There is no distension.     Palpations: Abdomen is soft. There is no mass.     Tenderness: There is no abdominal tenderness.  Musculoskeletal:        General: Normal range of motion.     Right lower leg: No edema.     Left lower leg: No edema.     Comments: No tenderness to palpation of thoracolumbar region Negative straight leg raise bilaterally  Skin:    Comments: Acne with inflammatory lesions on face and mid lower back. Scaly rash on lumbar region  Neurological:     Mental Status: She is alert and oriented to person, place, and time.  Psychiatric:     Comments: Dysphoric mood    CMP Latest Ref Rng & Units 04/06/2020 12/30/2017 12/08/2017  Glucose 70 - 99 mg/dL 96 - 113(H)  BUN 6 - 20 mg/dL 12 - 11  Creatinine 0.44 - 1.00 mg/dL 1.03(H) - 0.70  Sodium 135 - 145 mmol/L 140 - 135  Potassium 3.5 - 5.1 mmol/L 3.7 - 4.4  Chloride 98 - 111 mmol/L 105 - 99(L)  CO2 22 - 32 mmol/L 23 - 24  Calcium 8.9 - 10.3 mg/dL 9.1 - 9.4  Total Protein 6.5 - 8.1 g/dL 7.0 7.5 8.1  Total Bilirubin 0.3 - 1.2 mg/dL 1.5(H) 0.9 1.1  Alkaline Phos 38 - 126 U/L 92 108 121  AST  15 - 41 U/L 40 49(H) 87(H)  ALT 0 - 44 U/L 76(H) 94(H) 170(H)    Lipid Panel     Component Value Date/Time   CHOL 262 (H) 04/06/2020 0235   TRIG 169 (H) 04/06/2020 0235   HDL 36 (L) 04/06/2020 0235   CHOLHDL 7.3 04/06/2020 0235   VLDL 34 04/06/2020 0235   LDLCALC 192 (H) 04/06/2020 0235  CBC    Component Value Date/Time   WBC 12.0 (H) 02/03/2018 0830   RBC 3.83 (L) 02/03/2018 0830   HGB 11.8 (L) 02/03/2018 0830   HCT 36.8 02/03/2018 0830   PLT 294 02/03/2018 0830   MCV 96.1 02/03/2018 0830   MCH 30.8 02/03/2018 0830   MCHC 32.1 02/03/2018 0830   RDW 13.6 02/03/2018 0830   LYMPHSABS 2.6 03/09/2009 0803   MONOABS 0.7 03/09/2009 0803   EOSABS 0.1 03/09/2009 0803   BASOSABS 0.1 03/09/2009 0803    Lab Results  Component Value Date   HGBA1C 5.6 04/06/2020    GAD 7 : Generalized Anxiety Score 03/13/2021 12/30/2017  Nervous, Anxious, on Edge 3 2  Control/stop worrying 3 3  Worry too much - different things 2 2  Trouble relaxing 2 2  Restless 1 1  Easily annoyed or irritable 3 2  Afraid - awful might happen 3 1  Total GAD 7 Score 17 13  Anxiety Difficulty - Extremely difficult     Depression screen [facility] 2/9 03/13/2021 03/02/2019 02/02/2019 11/03/2018 07/01/2018  Decreased Interest 3 0 3 1 0  Down, Depressed, Hopeless '3 2 2 1 1  ' PHQ - 2 Score '6 2 5 2 1  ' Altered sleeping 3 - 0 1 0  Tired, decreased energy 3 - 3 0 0  Change in appetite 3 - '3 1 1  ' Feeling bad or failure about yourself  3 - '2 1 1  ' Trouble concentrating 2 - 0 1 0  Moving slowly or fidgety/restless 3 - 3 1 0  Suicidal thoughts 3 - 1 0 0  PHQ-9 Score 26 - '17 7 3  ' Difficult doing work/chores - - - Somewhat difficult Not difficult at all    Assessment & Plan:  1. Severe episode of recurrent major depressive disorder, without psychotic features ([place]) Uncontrolled She does have suicidal ideations and LCSW has been called in for counseling Will initiate Cymbalta which will be beneficial especially since she  has back pain - DULoxetine (CYMBALTA) 60 MG capsule; Take 1 capsule (60 mg total) by mouth daily.  Dispense: 30 capsule; Refill: 3 - CBC with Differential/Platelet  2. PCOS (polycystic ovarian syndrome) Would love to place on OCPs however due to intolerance we will hold off Refilled metformin We will hold off on spironolactone due to low blood pressure - metFORMIN (GLUCOPHAGE) 500 MG tablet; Take 1 tablet (500 mg total) by mouth daily with breakfast.  Dispense: 30 tablet; Refill: 6  3. Thoracolumbar back pain Musculoskeletal and can be due to heavy lifting as she is having to be the major caregiver of her mother Weight loss will be beneficial - Ambulatory referral to Physical Therapy - lidocaine (LIDODERM) 5 %; Place 1 patch onto the skin daily. Remove & Discard patch within 12 hours or as directed by MD  Dispense: 30 patch; Refill: 3 - DULoxetine (CYMBALTA) 60 MG capsule; Take 1 capsule (60 mg total) by mouth daily.  Dispense: 30 capsule; Refill: 3  4. Nexplanon in place - Ambulatory referral to Gynecology  5. Morbid obesity ([place]) We have discussed various options of weight management including medical and surgical She would need to speak with her mother with regards to surgical weight management meanwhile I have referred her for medical weight management - Amb Ref to Medical Weight Management  6. Screening for diabetes mellitus Screen for diabetes reveals she is normal - POCT glycosylated hemoglobin (Hb A1C)  7. Acne vulgaris Could also be secondary to PCOS Will treat  with Retin-A and BenzaClin If symptoms persist consider referral to dermatology - clindamycin-benzoyl peroxide (BENZACLIN) gel; Apply topically 2 (two) times daily.  Dispense: 25 g; Refill: 1  8. Screening for metabolic disorder - XID56+YSHU - TSH - T4, free  62 minutes of total time including median intraservice time spent reviewing chart, discussing diagnosis and management options of PCOS, back pain, morbid  obesity, depression.  She is a high risk patient given underlying risk factors and trauma and LCSW has been called in for psychotherapy.   No orders of the defined types were placed in this encounter.   Return in about 3 months (around 06/13/2021), or if symptoms worsen or fail to improve, for Medical conditions.       Charlott Rakes, MD, FAAFP. [facility] and [facility], [place]   03/13/2021, 1:56 PM
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
## 2021-03-13 NOTE — Patient Instructions (Signed)
Chronic Back Pain When back pain lasts longer than 3 months, it is called chronic back pain. Pain may get worse at certain times (flare-ups). There are things you can do at home to manage your pain. Follow these instructions at home: Pay attention to any changes in your symptoms. Take these actions to help with your pain: Managing pain and stiffness   If told, put ice on the painful area. Your doctor may tell you to use ice for 24-48 hours after the flare-up starts. To do this: Put ice in a plastic bag. Place a towel between your skin and the bag. Leave the ice on for 20 minutes, 2-3 times a day. If told, put heat on the painful area. Do this as often as told by your doctor. Use the heat source that your doctor recommends, such as a moist heat pack or a heating pad. Place a towel between your skin and the heat source. Leave the heat on for 20-30 minutes. Take off the heat if your skin turns bright red. This is especially important if you are unable to feel pain, heat, or cold. You may have a greater risk of getting burned. Soak in a warm bath. This can help relieve pain. Activity  Avoid bending and other activities that make pain worse. When standing: Keep your upper back and neck straight. Keep your shoulders pulled back. Avoid slouching. When sitting: Keep your back straight. Relax your shoulders. Do not round your shoulders or pull them backward. Do not sit or stand in one place for long periods of time. Take short rest breaks during the day. Lying down or standing is usually better than sitting. Resting can help relieve pain. When sitting or lying down for a long time, do some mild activity or stretching. This will help to prevent stiffness and pain. Get regular exercise. Ask your doctor what activities are safe for you. Do not lift anything that is heavier than 10 lb (4.5 kg) or the limit that you are told, until your doctor says that it is safe. To prevent injury when you lift  things: Bend your knees. Keep the weight close to your body. Avoid twisting. Sleep on a firm mattress. Try lying on your side with your knees slightly bent. If you lie on your back, put a pillow under your knees. Medicines Treatment may include medicines for pain and swelling taken by mouth or put on the skin, prescription pain medicine, or muscle relaxants. Take over-the-counter and prescription medicines only as told by your doctor. Ask your doctor if the medicine prescribed to you: Requires you to avoid driving or using machinery. Can cause trouble pooping (constipation). You may need to take these actions to prevent or treat trouble pooping: Drink enough fluid to keep your pee (urine) pale yellow. Take over-the-counter or prescription medicines. Eat foods that are high in fiber. These include beans, whole grains, and fresh fruits and vegetables. Limit foods that are high in fat and sugars. These include fried or sweet foods. General instructions Do not use any products that contain nicotine or tobacco, such as cigarettes, e-cigarettes, and chewing tobacco. If you need help quitting, ask your doctor. Keep all follow-up visits as told by your doctor. This is important. Contact a doctor if: Your pain does not get better with rest or medicine. Your pain gets worse, or you have new pain. You have a high fever. You lose weight very quickly. You have trouble doing your normal activities. Get help right away if: One   or both of your legs or feet feel weak. One or both of your legs or feet lose feeling (have numbness). You have trouble controlling when you poop (have a bowel movement) or pee (urinate). You have bad back pain and: You feel like you may vomit (nauseous), or you vomit. You have pain in your belly (abdomen). You have shortness of breath. You faint. Summary When back pain lasts longer than 3 months, it is called chronic back pain. Pain may get worse at certain times  (flare-ups). Use ice and heat as told by your doctor. Your doctor may tell you to use ice after flare-ups. This information is not intended to replace advice given to you by your health care provider. Make sure you discuss any questions you have with your health care provider. Document Revised: 10/14/2019 Document Reviewed: 10/14/2019 Elsevier Patient Education  2022 Elsevier Inc.  

## 2021-03-13 NOTE — Progress Notes (Signed)
Needs nexplanon removed Back pain

## 2021-03-14 ENCOUNTER — Other Ambulatory Visit: Payer: Self-pay | Admitting: Pharmacist

## 2021-03-14 ENCOUNTER — Other Ambulatory Visit: Payer: Self-pay | Admitting: Family Medicine

## 2021-03-14 ENCOUNTER — Telehealth: Payer: Self-pay | Admitting: Clinical

## 2021-03-14 ENCOUNTER — Encounter: Payer: Self-pay | Admitting: Family Medicine

## 2021-03-14 LAB — CBC WITH DIFFERENTIAL/PLATELET
Basophils Absolute: 0.1 10*3/uL (ref 0.0–0.2)
Basos: 1 %
EOS (ABSOLUTE): 0.2 10*3/uL (ref 0.0–0.4)
Eos: 2 %
Hematocrit: 42.5 % (ref 34.0–46.6)
Hemoglobin: 14.3 g/dL (ref 11.1–15.9)
Immature Grans (Abs): 0 10*3/uL (ref 0.0–0.1)
Immature Granulocytes: 0 %
Lymphocytes Absolute: 3.1 10*3/uL (ref 0.7–3.1)
Lymphs: 30 %
MCH: 30.2 pg (ref 26.6–33.0)
MCHC: 33.6 g/dL (ref 31.5–35.7)
MCV: 90 fL (ref 79–97)
Monocytes Absolute: 0.7 10*3/uL (ref 0.1–0.9)
Monocytes: 6 %
Neutrophils Absolute: 6.3 10*3/uL (ref 1.4–7.0)
Neutrophils: 61 %
Platelets: 280 10*3/uL (ref 150–450)
RBC: 4.73 x10E6/uL (ref 3.77–5.28)
RDW: 12.2 % (ref 11.7–15.4)
WBC: 10.4 10*3/uL (ref 3.4–10.8)

## 2021-03-14 LAB — CMP14+EGFR
ALT: 52 IU/L — ABNORMAL HIGH (ref 0–32)
AST: 30 IU/L (ref 0–40)
Albumin/Globulin Ratio: 1.5 (ref 1.2–2.2)
Albumin: 4.7 g/dL (ref 3.9–5.0)
Alkaline Phosphatase: 119 IU/L (ref 44–121)
BUN/Creatinine Ratio: 14 (ref 9–23)
BUN: 13 mg/dL (ref 6–20)
Bilirubin Total: 1.3 mg/dL — ABNORMAL HIGH (ref 0.0–1.2)
CO2: 21 mmol/L (ref 20–29)
Calcium: 10 mg/dL (ref 8.7–10.2)
Chloride: 101 mmol/L (ref 96–106)
Creatinine, Ser: 0.95 mg/dL (ref 0.57–1.00)
Globulin, Total: 3.1 g/dL (ref 1.5–4.5)
Glucose: 77 mg/dL (ref 65–99)
Potassium: 4.6 mmol/L (ref 3.5–5.2)
Sodium: 139 mmol/L (ref 134–144)
Total Protein: 7.8 g/dL (ref 6.0–8.5)
eGFR: 85 mL/min/{1.73_m2} (ref >59)

## 2021-03-14 LAB — TSH: TSH: 2.53 u[IU]/mL (ref 0.450–4.500)

## 2021-03-14 LAB — T4, FREE: Free T4: 1.17 ng/dL (ref 0.82–1.77)

## 2021-03-14 MED ORDER — TRETINOIN 0.025 % EX GEL: Freq: Every day | CUTANEOUS | 1 refills | Status: DC

## 2021-03-14 MED ORDER — TOPIRAMATE 25 MG PO TABS: 25.0000 mg | ORAL_TABLET | Freq: Every day | ORAL | 3 refills | Status: DC

## 2021-03-14 NOTE — Telephone Encounter (Signed)
Referral has been seen to [facility] requesting therapy and medication management.

## 2021-03-14 NOTE — Telephone Encounter (Signed)
Thanks so much. 

## 2021-03-14 NOTE — Addendum Note (Signed)
Addended by: Ruffin Pyo on: 03/14/2021 09:10 AM   Modules accepted: Level of Service

## 2021-03-17 ENCOUNTER — Encounter: Payer: Self-pay | Admitting: Family Medicine

## 2021-03-21 ENCOUNTER — Encounter: Payer: Self-pay | Admitting: Family Medicine

## 2021-03-21 ENCOUNTER — Other Ambulatory Visit: Payer: Self-pay

## 2021-03-21 ENCOUNTER — Ambulatory Visit: Payer: 59 | Attending: Family Medicine | Admitting: Family Medicine

## 2021-03-21 DIAGNOSIS — R7989 Other specified abnormal findings of blood chemistry: Secondary | ICD-10-CM

## 2021-03-21 NOTE — Progress Notes (Signed)
Discuss lab results.

## 2021-03-21 NOTE — Progress Notes (Signed)
Virtual Visit via Telephone Note  I connected with Chloe Pena, on 03/21/2021 at 12:46 PM by telephone due to the COVID-19 pandemic and verified that I am speaking with the correct person using two identifiers.   Consent: I discussed the limitations, risks, security and privacy concerns of performing an evaluation and management service by telephone and the availability of in person appointments. I also discussed with the patient that there may be a patient responsible charge related to this service. The patient expressed understanding and agreed to proceed.   Location of Patient: Home  Location of Provider: Clinic   Persons participating in Telemedicine visit: Jadi White Donalee Citrin Farrington-CMA Dr. Margarita Rana     History of Present Illness: 25 year old female with a history of depression, migraines, morbid obesity who is seen today at patient request to discuss lab results. Her most recent labs revealed elevated LFTs with a bilirubin of 1.3 and ALT of 52 but this has trended down compared to labs from 03/2020 with a bilirubin of 1.5 and ALT of 76. I had sent her a MyChart message with results which she stated was not completely clear to her.   Past Medical History:  Diagnosis Date   Anxiety    no meds   Asthma    as a child, no inhaler, no problems as adult   Depression    Migraine    otc med prn   PCOS (polycystic ovarian syndrome)    No Known Allergies  Current Outpatient Medications on File Prior to Visit  Medication Sig Dispense Refill   clindamycin-benzoyl peroxide (BENZACLIN) gel Apply topically 2 (two) times daily. 25 g 1   DULoxetine (CYMBALTA) 60 MG capsule Take 1 capsule (60 mg total) by mouth daily. 30 capsule 3   lidocaine (LIDODERM) 5 % Place 1 patch onto the skin daily. Remove & Discard patch within 12 hours or as directed by MD 30 patch 3   metFORMIN (GLUCOPHAGE) 500 MG tablet Take 1 tablet (500 mg total) by mouth daily with breakfast. 30  tablet 6   topiramate (TOPAMAX) 25 MG tablet Take 1 tablet (25 mg total) by mouth daily. 30 tablet 3   tretinoin (RETIN-A) 0.025 % gel Apply topically at bedtime. 45 g 1   No current facility-administered medications on file prior to visit.    ROS: Noncontributory  Observations/Objective: Awake, alert, and 2x3 Not in acute distress Normal mood  CMP Latest Ref Rng & Units 03/13/2021 04/06/2020 12/30/2017  Glucose 65 - 99 mg/dL 77 96 -  BUN 6 - 20 mg/dL 13 12 -  Creatinine 0.57 - 1.00 mg/dL 0.95 1.03(H) -  Sodium 134 - 144 mmol/L 139 140 -  Potassium 3.5 - 5.2 mmol/L 4.6 3.7 -  Chloride 96 - 106 mmol/L 101 105 -  CO2 20 - 29 mmol/L 21 23 -  Calcium 8.7 - 10.2 mg/dL 10.0 9.1 -  Total Protein 6.0 - 8.5 g/dL 7.8 7.0 7.5  Total Bilirubin 0.0 - 1.2 mg/dL 1.3(H) 1.5(H) 0.9  Alkaline Phos 44 - 121 IU/L 119 92 108  AST 0 - 40 IU/L 30 40 49(H)  ALT 0 - 32 IU/L 52(H) 76(H) 94(H)      Assessment and Plan: 1. Elevated LFTs Discussed possible etiologies of elevated LFTs Reassured that her LFTs are trending down which is a good sign In her case she had not been on any medications prior to labs being drawn Advised that fatty liver that could also be contributory and low-cholesterol diet  will be beneficial We will repeat at next office visit  2. Morbid obesity ([employer]) I had referred her to medical weight management and she is waiting for an appointment She has discussed with her mother and does not think surgery will be a good option.   Follow Up Instructions: Keep previously scheduled appointment   I discussed the assessment and treatment plan with the patient. The patient was provided an opportunity to ask questions and all were answered. The patient agreed with the plan and demonstrated an understanding of the instructions.   The patient was advised to call back or seek an in-person evaluation if the symptoms worsen or if the condition fails to improve as anticipated.     I  provided 11 minutes total of non-face-to-face time during this encounter.   Charlott Rakes, MD, FAAFP. [facility] and [place], [place]   03/21/2021, 12:46 PM

## 2021-03-24 ENCOUNTER — Encounter: Payer: Self-pay | Admitting: Family Medicine

## 2021-03-25 ENCOUNTER — Other Ambulatory Visit: Payer: Self-pay | Admitting: Family Medicine

## 2021-03-25 DIAGNOSIS — F332 Major depressive disorder, recurrent severe without psychotic features: Secondary | ICD-10-CM

## 2021-03-25 DIAGNOSIS — M545 Low back pain, unspecified: Secondary | ICD-10-CM

## 2021-03-25 DIAGNOSIS — M546 Pain in thoracic spine: Secondary | ICD-10-CM

## 2021-03-25 MED ORDER — DULOXETINE HCL 30 MG PO CPEP: 30.0000 mg | ORAL_CAPSULE | Freq: Every day | ORAL | 3 refills | Status: DC

## 2021-03-27 ENCOUNTER — Ambulatory Visit: Payer: 59 | Attending: Family Medicine | Admitting: Clinical

## 2021-03-27 ENCOUNTER — Other Ambulatory Visit: Payer: Self-pay

## 2021-03-27 DIAGNOSIS — F419 Anxiety disorder, unspecified: Secondary | ICD-10-CM

## 2021-03-27 DIAGNOSIS — F439 Reaction to severe stress, unspecified: Secondary | ICD-10-CM | POA: Diagnosis not present

## 2021-03-27 DIAGNOSIS — F332 Major depressive disorder, recurrent severe without psychotic features: Secondary | ICD-10-CM
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
## 2021-03-28 ENCOUNTER — Ambulatory Visit: Payer: 59 | Attending: Family Medicine | Admitting: Physical Therapy

## 2021-03-28 DIAGNOSIS — M545 Low back pain, unspecified: Secondary | ICD-10-CM | POA: Diagnosis present

## 2021-03-28 DIAGNOSIS — M6281 Muscle weakness (generalized): Secondary | ICD-10-CM | POA: Insufficient documentation

## 2021-03-28 DIAGNOSIS — R293 Abnormal posture: Secondary | ICD-10-CM | POA: Insufficient documentation

## 2021-03-28 DIAGNOSIS — R252 Cramp and spasm: Secondary | ICD-10-CM | POA: Insufficient documentation

## 2021-03-28 DIAGNOSIS — M546 Pain in thoracic spine: Secondary | ICD-10-CM | POA: Insufficient documentation

## 2021-03-28 NOTE — Therapy (Signed)
[place], [place], [address] Phone: [phone]   Fax:  [phone]  Physical Therapy Evaluation  Patient Details  Name: Genette Huertas MRN: [id number] Date of Birth: 1995-11-16 Referring Provider (PT): Charlott Rakes MD   Encounter Date: 03/28/2021   PT End of Session - 03/28/21 1522     Visit Number 1    Number of Visits 12    Date for PT Re-Evaluation 05/09/21    Authorization Type [employer]    PT Start Time 1455    PT Stop Time 1545    PT Time Calculation (min) 50 min    Activity Tolerance Patient tolerated treatment well    Behavior During Therapy [facility] for tasks assessed/performed             Past Medical History:  Diagnosis Date   Anxiety    no meds   Asthma    as a child, no inhaler, no problems as adult   Depression    Migraine    otc med prn   PCOS (polycystic ovarian syndrome)     Past Surgical History:  Procedure Laterality Date   LAPAROSCOPIC OVARIAN CYSTECTOMY N/A 02/11/2018   Procedure: LAPAROSCOPIC OVARIAN CYSTECTOMY;  Surgeon: Sloan Leiter, MD;  Location: [facility];  Service: Gynecology;  Laterality: N/A;   WISDOM TOOTH EXTRACTION      There were no vitals filed for this visit.    Subjective Assessment - 03/28/21 1505     Subjective I have been having mid back pain off an on from puberty due to my heavy chest.  But I have been having worse pain lately. I have specialized bra for G cup.  I dont have problems with my shoulders , but my mid back hurts.  some times my L knee collapse on me. I am my mothers caregiver  and she has POTS< , pacemaker, neuropathy she can walk but she has a  wheelchair.  I care for 24 hours a day except when I am at school and I am on call.   I am going to school  [employer] to study agriculture. I had to get an electric chair out of a truck ( weighs 250 lb)  I dont get along with my father or brother.  I am not in contact with them.     Pertinent History headache  PCOS, major depressive episode, anxiety, suicide ideation, PTSD  See medical history    How long can you sit comfortably? depends on if I start out in pain    How long can you stand comfortably? 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  I have trouble getting up from a chair    How long can you walk comfortably? I dont walk very much. only what I have to.  I live in an unsafe neighborhood    Diagnostic tests none - just for PCOS    Patient Stated Goals I want to get my degrees from [employer]    Currently in Pain? Yes    Pain Score 4    at worst 10/10 getting out of bed   Pain Location Thoracic    Pain Orientation Right;Left    Pain Descriptors / Indicators Squeezing    Pain Type Chronic pain    Pain Onset More than a month ago    Pain Frequency Intermittent    Aggravating Factors  getting up from a chair.  Multiple Pain Sites Yes    Pain Score 4   at worst 10/10   Pain Location Back    Pain Orientation Right;Left    Pain Descriptors / Indicators Sharp;Spasm    Pain Type Chronic pain                [facility] PT Assessment - 03/28/21 0001       Assessment   Medical Diagnosis thoracolumbar pain    Referring Provider (PT) Charlott Rakes MD    Onset Date/Surgical Date 02/20/21   off and on for years but worsening in last couple of months   Hand Dominance Left    Prior Therapy None      Precautions   Precautions None      Restrictions   Weight Bearing Restrictions No      Balance Screen   Has the patient fallen in the past 6 months No    Has the patient had a decrease in activity level because of a fear of falling?  No    Is the patient reluctant to leave their home because of a fear of falling?  No      Home Ecologist residence    Living Arrangements Parent   mom/ parents divorced   Type of [place] Access Stairs to enter    Entrance Stairs-Number of Steps 3    Entrance Stairs-Rails Can reach  both    [place] One level      Cognition   Overall Cognitive Status History of cognitive impairments - at baseline      Observation/Other Assessments   Focus on Therapeutic Outcomes (FOTO)  FOTO intake 52%  predicted 68%      Sensation   Light Touch Appears Intact      Functional Tests   Functional tests Sit to Stand      Sit to Stand   Comments 5 x sts 31.86      Posture/Postural Control   Posture/Postural Control Postural limitations    Posture Comments mobidly obese with increased abdominal girth      ROM / Strength   AROM / PROM / Strength AROM;Strength      AROM   Overall AROM Comments WNL shoulder bil, tight and restricted thoracic with extension movment    Lumbar Flexion 60    Lumbar Extension 10    Lumbar - Right Side Bend 20    Lumbar - Left Side Bend 20    Lumbar - Right Rotation 50% limited    Lumbar - Left Rotation 50% limited      Strength   Overall Strength Deficits    Right Shoulder Flexion 5/5    Right Shoulder ABduction 5/5    Right Shoulder Internal Rotation 5/5    Right Shoulder External Rotation 4+/5    Left Shoulder Flexion 5/5    Left Shoulder ABduction 5/5    Left Shoulder Internal Rotation 5/5    Left Shoulder External Rotation 4+/5    Right Hip Flexion 4+/5    Right Hip Extension 4/5    Right Hip ABduction 4/5    Left Hip Flexion 4+/5    Left Hip Extension 4/5    Left Hip ABduction 4/5    Right Knee Flexion 5/5    Right Knee Extension 5/5    Left Knee Flexion 4/5    Left Knee Extension 4/5    Right Ankle Dorsiflexion 4+/5    Right Ankle  Plantar Flexion 5/5    Left Ankle Dorsiflexion 4+/5    Left Ankle Plantar Flexion 5/5      Palpation   Palpation comment tenderness over thoracic T-8 to Li2 bil      Slump test   Findings Negative    Comment bil      Straight Leg Raise   Findings Negative    Comment bil                        Objective measurements completed on examination: See above findings.                PT Education - 03/28/21 1542     Education Details POC Explanation of findings  Inital HEP slep hygiene, basic movement and hydration    Person(s) Educated Patient    Methods Explanation;Demonstration;Tactile cues;Verbal cues;Handout    Comprehension Verbalized understanding;Returned demonstration              PT Short Term Goals - 03/28/21 1546       PT SHORT TERM GOAL #1   Title STG=LTG               PT Long Term Goals - 03/28/21 1547       PT LONG TERM GOAL #1   Title Pt will be independent with advanced HEP    Baseline Pt with no knowledge of exercise    Time 6    Period Weeks    Status New    Target Date 05/09/21      PT LONG TERM GOAL #2   Title PT will improved your 5 x STS to 13 sec or less    Baseline eval 31.86 sec    Time 6    Period Weeks    Status New    Target Date 05/09/21      PT LONG TERM GOAL #3   Title Pt will be able to transfer out of bed without pain 2/10 or less    Baseline Pt reports 4/10 to 10/10 rising from bed and chair    Time 6    Period Weeks    Status New    Target Date 05/09/21      PT LONG TERM GOAL #4   Title FOTO will improve from 52% intake   to 68%    indicating improved functional mobility.    Baseline eval intake 52%    Time 6    Period Weeks    Status New    Target Date 05/09/21      PT LONG TERM GOAL #5   Title Pt will be able to get 7000 steps in a day at minimum using a pedometer for better health    Baseline Pt is caregiver for mother but does not prioritize her own self care and health.  Does not perform any routine exercises at this time    Time 6    Period Weeks    Status New    Target Date 05/09/21                    Plan - 03/28/21 1702     Clinical Impression Statement Ms elverna caffee enters clinic and complains of lower thoracic/ lumbar pain recently exacerbated by unknow origin but does express she has had chronic back pain   since puberty.  She is morbily  obese  59 BMI and reports she also feel like she has a week  knee that " collapses" on her at times.  She is mostly disturbed by her inability to rise from chairs without haveing pain from 4/10 to 10/10.  5 x sts 31.86 sec ( normal for no risk of fall- 13 sec)  Ms Dema Severin is a full time caregiver for her mother who has neuropathy, pacemaker and oftern uses a W/C.  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   Pt back pain is tender to palpation and may benefit fromTPDN as well as skilled PT to address impairments and functional strength deficits.    Personal Factors and Comorbidities Comorbidity 1    Comorbidities headache  PCOS, major depressive episode, anxiety, suicide ideation, PTSD  See medical history    Examination-Activity Limitations Bend;Transfers;Stand;Locomotion Level    Examination-Participation Restrictions Laundry;Other   caregiver for mother. student   Stability/Clinical Decision Making Evolving/Moderate complexity    Clinical Decision Making Moderate    Rehab Potential Good    PT Frequency 2x / week    PT Duration 6 weeks    PT Treatment/Interventions ADLs/Self Care Home Management;Cryotherapy;Electrical Stimulation;Iontophoresis 4mg /ml Dexamethasone;Moist Heat;Traction;Therapeutic exercise;Therapeutic activities;Functional mobility training;Stair training;Gait training;Neuromuscular re-education;Patient/family education;Passive range of motion;Manual techniques;Dry needling;Taping;Spinal Manipulations    PT Next Visit Plan Possible TPDN for lower thoracic and low back  progress exercises for functional strength.  body mechanics as caregiver for dependent mom  Go over FOTO report    PT Home Exercise Plan [email or web address]    Consulted and Agree with Plan of Care Patient             Patient will benefit from skilled therapeutic intervention in order to improve the following  deficits and impairments:  Decreased mobility, Decreased range of motion, Decreased strength, Increased muscle spasms, Postural dysfunction, Improper body mechanics, Pain, Obesity  Visit Diagnosis: Pain in thoracic spine - Plan: PT plan of care cert/re-cert  Bilateral low back pain without sciatica, unspecified chronicity - Plan: PT plan of care cert/re-cert  Muscle weakness (generalized) - Plan: PT plan of care cert/re-cert  Cramp and spasm - Plan: PT plan of care cert/re-cert  Abnormal posture - Plan: PT plan of care cert/re-cert  Access Code: [email or web address]: [email or web address]: 07/12/2022Prepared by: Lesly Rubenstein Notes Exercise sitting with yoga hands. put hand together. press knees together. slowly rotate to R and feel thoracic stretch for 10 sec. Return to middle and rotate to L with knees pressed together and hold for 10 sec Do 3-5 each side Exercises  Sit to Stand Without Arm Support - 1 x daily - 7 x weekly - 3 sets - 10 reps  Standing Thoracic Spine Stretch - 2-3 x daily - 7 x weekly - 1 sets - 2-3 reps - 30 sec hold  Sidelying Thoracic Rotation with Open Book - 1 x daily - 7 x weekly - 1 sets - 105 reps - 5-10 sec hold   Problem List Patient Active Problem List   Diagnosis Date Noted   Suicidal ideation 04/08/2020   Major depressive disorder, single episode 04/07/2020   PTSD (post-traumatic stress disorder) 04/07/2020   Generalized anxiety disorder 04/07/2020   Severe episode of recurrent major depressive disorder, without psychotic features ([place])    Morbid obesity ([place]) 12/08/2017   PCOS (polycystic ovarian syndrome) 12/08/2017   Headache(784.0) 02/05/2014   Voncille Lo, PT, [place] Certified Exercise Expert for the Aging Adult  03/28/21 5:41  PM Phone: [phone] Fax: [place] [facility] [address] [place], [place], [address] Phone: [phone]   Fax:   [phone]  Name: Shenita Trego MRN: [id number] Date of Birth: December 19, 1995  Check all possible CPT codes: 97110- Therapeutic Exercise, [phone]- Neuro Re-education, [phone] - Gait Training, [phone] - Manual Therapy, [phone] - Therapeutic Activities, [phone] - Self Care, [phone] - Mechanical traction, [id number] - Electrical stimulation (unattended), [id number] - Electrical stimulation (Manual), [id number] - Iontophoresis, [id number] - Ultrasound, and [id number] - Physical performance training

## 2021-03-28 NOTE — Patient Instructions (Addendum)
You need to drink at least 100 oz a day, Try to walk 7000 to 10000 steps a day  Try to sleep 7-9 hours of sleep a day.  Sleep Tips    Keep a consistent sleep schedule. Get up at the same time every day, even on weekends or during vacations. Set a bedtime that is early enough for you to get at least 7 hours of sleep. Don't go to bed unless you are sleepy.  If you don't fall asleep after 20 minutes, get out of bed.  Establish a relaxing bedtime routine.  Use your bed only for sleep and sex.  Make your bedroom quiet and relaxing. Keep the room at a comfortable, cool temperature.  Limit exposure to bright light in the evenings. Turn off electronic devices at least 30 minutes before bedtime. Don't eat a large meal before bedtime. If you are hungry at night, eat a light, healthy snack.  Exercise regularly and maintain a healthy diet.  Avoid consuming caffeine in the late afternoon or evening.  Avoid consuming alcohol before bedtime.  Reduce your fluid intake before bedtime.     Voncille Lo, PT, [place] Certified Exercise Expert for the Aging Adult  03/28/21 3:42 PM Phone: [phone] Fax: [phone]

## 2021-04-04 ENCOUNTER — Other Ambulatory Visit: Payer: Self-pay

## 2021-04-04 ENCOUNTER — Ambulatory Visit: Payer: 59

## 2021-04-04 DIAGNOSIS — M546 Pain in thoracic spine: Secondary | ICD-10-CM | POA: Diagnosis not present

## 2021-04-04 DIAGNOSIS — R252 Cramp and spasm: Secondary | ICD-10-CM

## 2021-04-04 DIAGNOSIS — M6281 Muscle weakness (generalized): Secondary | ICD-10-CM

## 2021-04-04 DIAGNOSIS — R293 Abnormal posture: Secondary | ICD-10-CM

## 2021-04-04 DIAGNOSIS — M545 Low back pain, unspecified: Secondary | ICD-10-CM

## 2021-04-04 NOTE — Therapy (Signed)
[place], [place], [address] Phone: [phone]   Fax:  [phone]  Physical Therapy Treatment  Patient Details  Name: Chloe Pena MRN: [id number] Date of Birth: 08/10/1996 Referring Provider (PT): Charlott Rakes MD   Encounter Date: 04/04/2021   PT End of Session - 04/04/21 1229     Visit Number 2    Number of Visits 12    Date for PT Re-Evaluation 05/09/21    Authorization Type [employer]    PT Start Time 1230    PT Stop Time 1314    PT Time Calculation (min) 44 min    Activity Tolerance Patient tolerated treatment well    Behavior During Therapy [facility] for tasks assessed/performed             Past Medical History:  Diagnosis Date   Anxiety    no meds   Asthma    as a child, no inhaler, no problems as adult   Depression    Migraine    otc med prn   PCOS (polycystic ovarian syndrome)     Past Surgical History:  Procedure Laterality Date   LAPAROSCOPIC OVARIAN CYSTECTOMY N/A 02/11/2018   Procedure: LAPAROSCOPIC OVARIAN CYSTECTOMY;  Surgeon: Sloan Leiter, MD;  Location: [facility];  Service: Gynecology;  Laterality: N/A;   WISDOM TOOTH EXTRACTION      There were no vitals filed for this visit.   Subjective Assessment - 04/04/21 1230     Subjective Patient reports she is feeling fine. Her back is feeling "meh, could be better." She had to lift her mom from the floor yesterday, so this flared her up some.    Pertinent History headache  PCOS, major depressive episode, anxiety, suicide ideation, PTSD  See medical history    How long can you sit comfortably? depends on if I start out in pain    How long can you stand comfortably? 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  I have trouble getting up from a chair    How long can you walk comfortably? I dont walk very much. only what I have to.  I live in an unsafe neighborhood    Diagnostic tests none - just for PCOS    Patient Stated  Goals I want to get my degrees from [employer]    Currently in Pain? Yes    Pain Score 4     Pain Location Thoracic    Pain Orientation Right;Left    Pain Descriptors / Indicators Stabbing   stiffness   Pain Type Chronic pain    Pain Onset More than a month ago    Pain Frequency Intermittent    Aggravating Factors  lifting                               [facility] Adult PT Treatment/Exercise - 04/04/21 0001       Self-Care   Self-Care Other Self-Care Comments    Other Self-Care Comments  see patient education      Lumbar Exercises: Stretches   Other Lumbar Stretch Exercise standing T-spine stretch      Lumbar Exercises: Supine   Pelvic Tilt 10 reps    Pelvic Tilt Limitations x2    Bent Knee Raise 10 reps    Bent Knee Raise Limitations x2; with posterior pelvic tilt      Lumbar Exercises: Sidelying  Other Sidelying Lumbar Exercises sidelying open book 1 x 10 each      Lumbar Exercises: Quadruped   [place] 20 reps    Other Quadruped Lumbar Exercises thread the needle 1 x 5 bilateral      Knee/Hip Exercises: Seated   Sit to Sand 10 reps                    PT Education - 04/04/21 1251     Education Details Education on FOTO results and anticipated progress; Updated HEP. Education on sleep positioning, posture, and body mechanics with lifting/bending.    Person(s) Educated Patient    Methods Explanation;Demonstration;Verbal cues;Handout;Tactile cues    Comprehension Verbalized understanding;Returned demonstration;Verbal cues required;Tactile cues required              PT Short Term Goals - 03/28/21 1546       PT SHORT TERM GOAL #1   Title STG=LTG               PT Long Term Goals - 03/28/21 1547       PT LONG TERM GOAL #1   Title Pt will be independent with advanced HEP    Baseline Pt with no knowledge of exercise    Time 6    Period Weeks    Status New    Target Date 05/09/21      PT LONG TERM GOAL #2   Title PT  will improved your 5 x STS to 13 sec or less    Baseline eval 31.86 sec    Time 6    Period Weeks    Status New    Target Date 05/09/21      PT LONG TERM GOAL #3   Title Pt will be able to transfer out of bed without pain 2/10 or less    Baseline Pt reports 4/10 to 10/10 rising from bed and chair    Time 6    Period Weeks    Status New    Target Date 05/09/21      PT LONG TERM GOAL #4   Title FOTO will improve from 52% intake   to 68%    indicating improved functional mobility.    Baseline eval intake 52%    Time 6    Period Weeks    Status New    Target Date 05/09/21      PT LONG TERM GOAL #5   Title Pt will be able to get 7000 steps in a day at minimum using a pedometer for better health    Baseline Pt is caregiver for mother but does not prioritize her own self care and health.  Does not perform any routine exercises at this time    Time 6    Period Weeks    Status New    Target Date 05/09/21                   Plan - 04/04/21 1230     Clinical Impression Statement Patient arrives with mild thoracic pain that was recently exacerbated by having to lift her dependent mother from the floor yesterday. She demonstrates independence with initial HEP. She requested to remove mask for standing thoracic stretch as this positioning with her mask on can induce a panic attack due to previous trauma, so treatment session was completed in private room to accomodate patient's request. Able to progress trunk mobility and begin core stabilization with patient having difficulty performing pelvic tilts  and supine TA march. Time spent educating patient on proper lifting/bending mechanics as she is the primary caregiver for her mother. Overall she tolerated session well today without an increase in back pain.    Personal Factors and Comorbidities Comorbidity 1    Comorbidities headache  PCOS, major depressive episode, anxiety, suicide ideation, PTSD  See medical history     Examination-Activity Limitations Bend;Transfers;Stand;Locomotion Level    Examination-Participation Restrictions Laundry;Other   caregiver for mother. student   Stability/Clinical Decision Making Evolving/Moderate complexity    Rehab Potential Good    PT Frequency 2x / week    PT Duration 6 weeks    PT Treatment/Interventions ADLs/Self Care Home Management;Cryotherapy;Electrical Stimulation;Iontophoresis 4mg /ml Dexamethasone;Moist Heat;Traction;Therapeutic exercise;Therapeutic activities;Functional mobility training;Stair training;Gait training;Neuromuscular re-education;Patient/family education;Passive range of motion;Manual techniques;Dry needling;Taping;Spinal Manipulations    PT Next Visit Plan Possible TPDN for lower thoracic and low back  progress exercises for functional strength.  body mechanics as caregiver for dependent mom; mindful of hx of panic attacks as patient requests to remove mask for some exercises    PT Home Exercise Plan [email or web address]    Consulted and Agree with Plan of Care Patient             Patient will benefit from skilled therapeutic intervention in order to improve the following deficits and impairments:  Decreased mobility, Decreased range of motion, Decreased strength, Increased muscle spasms, Postural dysfunction, Improper body mechanics, Pain, Obesity  Visit Diagnosis: Pain in thoracic spine  Bilateral low back pain without sciatica, unspecified chronicity  Muscle weakness (generalized)  Cramp and spasm  Abnormal posture     Problem List Patient Active Problem List   Diagnosis Date Noted   Suicidal ideation 04/08/2020   Major depressive disorder, single episode 04/07/2020   PTSD (post-traumatic stress disorder) 04/07/2020   Generalized anxiety disorder 04/07/2020   Severe episode of recurrent major depressive disorder, without psychotic features ([place])    Morbid obesity ([place]) 12/08/2017   PCOS (polycystic ovarian syndrome) 12/08/2017    Headache(784.0) 02/05/2014   Gwendolyn Grant, PT, DPT, ATC 04/04/21 2:13 PM   [facility] [address] [place], [place], [address] Phone: [phone]   Fax:  [phone]  Name: Chloe Pena MRN: [id number] Date of Birth: 26-Mar-1996

## 2021-04-04 NOTE — Patient Instructions (Signed)

## 2021-04-06 ENCOUNTER — Ambulatory Visit: Payer: 59 | Admitting: Physical Therapy

## 2021-04-06 ENCOUNTER — Other Ambulatory Visit: Payer: Self-pay

## 2021-04-06 DIAGNOSIS — R252 Cramp and spasm: Secondary | ICD-10-CM

## 2021-04-06 DIAGNOSIS — M546 Pain in thoracic spine: Secondary | ICD-10-CM

## 2021-04-06 DIAGNOSIS — M6281 Muscle weakness (generalized): Secondary | ICD-10-CM

## 2021-04-06 DIAGNOSIS — R293 Abnormal posture: Secondary | ICD-10-CM

## 2021-04-06 NOTE — Therapy (Signed)
[place], [place], [address] Phone: [phone]   Fax:  [phone]  Physical Therapy Treatment  Patient Details  Name: Chloe Pena MRN: [id number] Date of Birth: 1996/04/10 Referring Provider (PT): Charlott Rakes MD   Encounter Date: 04/06/2021   PT End of Session - 04/06/21 1254     Visit Number 3    Number of Visits 12    Date for PT Re-Evaluation 05/09/21    Authorization Type [employer]    PT Start Time 7902    PT Stop Time 1315    PT Time Calculation (min) 40 min    Activity Tolerance Patient tolerated treatment well    Behavior During Therapy [facility] for tasks assessed/performed             Past Medical History:  Diagnosis Date   Anxiety    no meds   Asthma    as a child, no inhaler, no problems as adult   Depression    Migraine    otc med prn   PCOS (polycystic ovarian syndrome)     Past Surgical History:  Procedure Laterality Date   LAPAROSCOPIC OVARIAN CYSTECTOMY N/A 02/11/2018   Procedure: LAPAROSCOPIC OVARIAN CYSTECTOMY;  Surgeon: Sloan Leiter, MD;  Location: [facility];  Service: Gynecology;  Laterality: N/A;   WISDOM TOOTH EXTRACTION      There were no vitals filed for this visit.   Subjective Assessment - 04/06/21 1249     Subjective Back is OK, 3/10.  Trying to do her exercises right at home.    Currently in Pain? Yes    Pain Score 3     Pain Location Back    Pain Orientation Mid;Lower    Pain Descriptors / Indicators Stabbing;Sore    Pain Type Chronic pain    Pain Onset More than a month ago    Pain Frequency Intermittent    Aggravating Factors  lifting    Pain Relieving Factors rest    Effect of Pain on Daily Activities needs to be able to lift and care for mom and pets    Multiple Pain Sites No                [facility] Adult PT Treatment/Exercise - 04/06/21 0001       Lumbar Exercises: Standing   Lifting Limitations Palloff press blue x 10 each  side , rotation x 10    Row Strengthening;Both;15 reps;Theraband    Theraband Level (Row) Level 4 (Blue)    Shoulder Extension Strengthening;Both;15 reps;Theraband    Theraband Level (Shoulder Extension) Level 4 (Blue)      Lumbar Exercises: Seated   Sit to Stand 10 reps    Sit to Stand Limitations 10 lb and chest press x 10 for 2 nd set      Lumbar Exercises: Supine   AB Set Limitations with ball x 10    Pelvic Tilt 10 reps    Pelvic Tilt Limitations x2 used ball to press    Bridge 10 reps    Bridge Limitations with ball    Other Supine Lumbar Exercises chest pull x 10 green band                    PT Education - 04/06/21 1318     Education Details core    Person(s) Educated Patient    Methods Explanation    Comprehension Verbalized understanding;Returned demonstration;Verbal cues required;Tactile cues required  PT Short Term Goals - 03/28/21 1546       PT SHORT TERM GOAL #1   Title STG=LTG               PT Long Term Goals - 04/06/21 1255       PT LONG TERM GOAL #1   Title Pt will be independent with advanced HEP    Status On-going      PT LONG TERM GOAL #2   Title PT will improved your 5 x STS to 13 sec or less    Status On-going      PT LONG TERM GOAL #3   Title Pt will be able to transfer out of bed without pain 2/10 or less    Status On-going      PT LONG TERM GOAL #4   Title FOTO will improve from 52% intake   to 68%    indicating improved functional mobility.    Status On-going      PT LONG TERM GOAL #5   Title Pt will be able to get 7000 steps in a day at minimum using a pedometer for better health    Status On-going                   Plan - 04/06/21 1240     Clinical Impression Statement Patient tolerated session without increased pain .  Able to educate on core activation with success using props in supine and in standing. Cont POC.    PT Treatment/Interventions ADLs/Self Care Home  Management;Cryotherapy;Electrical Stimulation;Iontophoresis 4mg /ml Dexamethasone;Moist Heat;Traction;Therapeutic exercise;Therapeutic activities;Functional mobility training;Stair training;Gait training;Neuromuscular re-education;Patient/family education;Passive range of motion;Manual techniques;Dry needling;Taping;Spinal Manipulations    PT Next Visit Plan Possible TPDN for lower thoracic and low back  progress exercises for functional strength.  body mechanics as caregiver for dependent mom; mindful of hx of panic attacks as patient requests to remove mask for some exercises    PT Home Exercise Plan [id number]    Consulted and Agree with Plan of Care Patient             Patient will benefit from skilled therapeutic intervention in order to improve the following deficits and impairments:  Decreased mobility, Decreased range of motion, Decreased strength, Increased muscle spasms, Postural dysfunction, Improper body mechanics, Pain, Obesity  Visit Diagnosis: Pain in thoracic spine  Muscle weakness (generalized)  Cramp and spasm  Abnormal posture     Problem List Patient Active Problem List   Diagnosis Date Noted   Suicidal ideation 04/08/2020   Major depressive disorder, single episode 04/07/2020   PTSD (post-traumatic stress disorder) 04/07/2020   Generalized anxiety disorder 04/07/2020   Severe episode of recurrent major depressive disorder, without psychotic features ([place])    Morbid obesity ([place]) 12/08/2017   PCOS (polycystic ovarian syndrome) 12/08/2017   Headache(784.0) 02/05/2014    Chloe Pena 04/06/2021, 1:21 PM  [facility] [address] [place], [place], [address] Phone: [phone]   Fax:  [phone]  Name: Chloe Pena MRN: [id number] Date of Birth: 11-16-1995  Raeford Razor, PT 04/06/21 1:21 PM Phone: [phone] Fax: [phone]

## 2021-04-10 ENCOUNTER — Ambulatory Visit: Payer: 59 | Attending: Family Medicine | Admitting: Clinical

## 2021-04-10 ENCOUNTER — Other Ambulatory Visit: Payer: Self-pay

## 2021-04-10 DIAGNOSIS — F331 Major depressive disorder, recurrent, moderate: Secondary | ICD-10-CM | POA: Diagnosis not present

## 2021-04-10 DIAGNOSIS — F411 Generalized anxiety disorder: Secondary | ICD-10-CM | POA: Diagnosis not present

## 2021-04-10 DIAGNOSIS — F439 Reaction to severe stress, unspecified: Secondary | ICD-10-CM | POA: Diagnosis not present
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
## 2021-04-11 ENCOUNTER — Ambulatory Visit: Payer: 59

## 2021-04-11 DIAGNOSIS — R252 Cramp and spasm: Secondary | ICD-10-CM

## 2021-04-11 DIAGNOSIS — M6281 Muscle weakness (generalized): Secondary | ICD-10-CM

## 2021-04-11 DIAGNOSIS — R293 Abnormal posture: Secondary | ICD-10-CM

## 2021-04-11 DIAGNOSIS — M545 Low back pain, unspecified: Secondary | ICD-10-CM

## 2021-04-11 DIAGNOSIS — M546 Pain in thoracic spine: Secondary | ICD-10-CM | POA: Diagnosis not present

## 2021-04-11 NOTE — Therapy (Signed)
[place], [place], [address] Phone: [phone]   Fax:  [phone]  Physical Therapy Treatment  Patient Details  Name: Chloe Pena MRN: [id number] Date of Birth: 05-14-1996 Referring Provider (PT): Charlott Rakes MD   Encounter Date: 04/11/2021   PT End of Session - 04/11/21 1231     Visit Number 4    Number of Visits 12    Date for PT Re-Evaluation 05/09/21    Authorization Type [employer]    PT Start Time [id number]    PT Stop Time 1313    PT Time Calculation (min) 42 min    Activity Tolerance Patient tolerated treatment well    Behavior During Therapy [facility] for tasks assessed/performed             Past Medical History:  Diagnosis Date   Anxiety    no meds   Asthma    as a child, no inhaler, no problems as adult   Depression    Migraine    otc med prn   PCOS (polycystic ovarian syndrome)     Past Surgical History:  Procedure Laterality Date   LAPAROSCOPIC OVARIAN CYSTECTOMY N/A 02/11/2018   Procedure: LAPAROSCOPIC OVARIAN CYSTECTOMY;  Surgeon: Sloan Leiter, MD;  Location: [facility];  Service: Gynecology;  Laterality: N/A;   WISDOM TOOTH EXTRACTION      There were no vitals filed for this visit.   Subjective Assessment - 04/11/21 1235     Subjective Patient reports she is tired and in a little pain right now.    Currently in Pain? Yes    Pain Score 4     Pain Location Back    Pain Orientation Lower    Pain Descriptors / Indicators Aching    Pain Type Chronic pain    Pain Onset More than a month ago    Pain Frequency Intermittent                [facility] PT Assessment - 04/11/21 0001       Sit to Stand   Comments 5x STS 17 second                           [facility] Adult PT Treatment/Exercise - 04/11/21 0001       Lumbar Exercises: Stretches   Figure 4 Stretch 60 seconds    Figure 4 Stretch Limitations bilateral      Lumbar Exercises: Supine    Pelvic Tilt 10 reps    Bent Knee Raise 10 reps    Bent Knee Raise Limitations x2; with posterior pelvic tilt    Straight Leg Raise 10 reps    Straight Leg Raises Limitations x2; bilateral      Lumbar Exercises: Sidelying   Hip Abduction 10 reps    Hip Abduction Limitations x2; bilateral      Lumbar Exercises: Prone   Opposite Arm/Leg Raise 10 reps    Opposite Arm/Leg Raise Limitations x2      Lumbar Exercises: Quadruped   Single Arm Raise 10 reps    Single Arm Raises Limitations x2                      PT Short Term Goals - 03/28/21 1546       PT SHORT TERM GOAL #1   Title STG=LTG               PT  Long Term Goals - 04/06/21 1255       PT LONG TERM GOAL #1   Title Pt will be independent with advanced HEP    Status On-going      PT LONG TERM GOAL #2   Title PT will improved your 5 x STS to 13 sec or less    Status On-going      PT LONG TERM GOAL #3   Title Pt will be able to transfer out of bed without pain 2/10 or less    Status On-going      PT LONG TERM GOAL #4   Title FOTO will improve from 52% intake   to 68%    indicating improved functional mobility.    Status On-going      PT LONG TERM GOAL #5   Title Pt will be able to get 7000 steps in a day at minimum using a pedometer for better health    Status On-going                   Plan - 04/11/21 1232     Clinical Impression Statement Improved carrryover of proper core activation noted with pelvic tilts and supine marching. Her 5 x STS has much improved compared to baseline, nearing this long term functional goal. She quickly fatigues with targeted hip flexor and abductor strengthening bilaterally. She reported anterior knee pain towards end of second set of SLR on the LLE, otherwise no complaints of pain throughout session. She reported a reduction in her low back pain at the end of the session rated as 2/10.    PT Treatment/Interventions ADLs/Self Care Home  Management;Cryotherapy;Electrical Stimulation;Iontophoresis '4mg'$ /ml Dexamethasone;Moist Heat;Traction;Therapeutic exercise;Therapeutic activities;Functional mobility training;Stair training;Gait training;Neuromuscular re-education;Patient/family education;Passive range of motion;Manual techniques;Dry needling;Taping;Spinal Manipulations    PT Next Visit Plan contine core and hip strengthening, update HEP; mindful of hx of panic attacks as patient requests to remove mask for some exercises    PT Home Exercise Plan [id number]    Consulted and Agree with Plan of Care Patient             Patient will benefit from skilled therapeutic intervention in order to improve the following deficits and impairments:  Decreased mobility, Decreased range of motion, Decreased strength, Increased muscle spasms, Postural dysfunction, Improper body mechanics, Pain, Obesity  Visit Diagnosis: Pain in thoracic spine  Muscle weakness (generalized)  Cramp and spasm  Abnormal posture  Bilateral low back pain without sciatica, unspecified chronicity     Problem List Patient Active Problem List   Diagnosis Date Noted   Suicidal ideation 04/08/2020   Major depressive disorder, single episode 04/07/2020   PTSD (post-traumatic stress disorder) 04/07/2020   Generalized anxiety disorder 04/07/2020   Severe episode of recurrent major depressive disorder, without psychotic features ([place])    Morbid obesity ([place]) 12/08/2017   PCOS (polycystic ovarian syndrome) 12/08/2017   Headache(784.0) 02/05/2014   Gwendolyn Grant, PT, DPT, ATC 04/11/21 1:16 PM  [facility] [address] [place], [place], [address] Phone: [phone]   Fax:  [phone]  Name: Chloe Pena MRN: [id number] Date of Birth: Aug 08, 1996

## 2021-04-18 ENCOUNTER — Encounter: Payer: Self-pay | Admitting: Physical Therapy

## 2021-04-18 ENCOUNTER — Other Ambulatory Visit: Payer: Self-pay

## 2021-04-18 ENCOUNTER — Ambulatory Visit: Payer: 59 | Attending: Family Medicine | Admitting: Physical Therapy

## 2021-04-18 DIAGNOSIS — M545 Low back pain, unspecified: Secondary | ICD-10-CM | POA: Insufficient documentation

## 2021-04-18 DIAGNOSIS — R293 Abnormal posture: Secondary | ICD-10-CM | POA: Diagnosis present

## 2021-04-18 DIAGNOSIS — M546 Pain in thoracic spine: Secondary | ICD-10-CM | POA: Insufficient documentation

## 2021-04-18 DIAGNOSIS — R252 Cramp and spasm: Secondary | ICD-10-CM

## 2021-04-18 DIAGNOSIS — M6281 Muscle weakness (generalized): Secondary | ICD-10-CM | POA: Insufficient documentation

## 2021-04-18 NOTE — Patient Instructions (Addendum)

## 2021-04-18 NOTE — Therapy (Signed)
[place], [place], [address] Phone: [phone]   Fax:  [phone]  Physical Therapy Treatment  Patient Details  Name: Chloe Pena MRN: [id number] Date of Birth: 1995-12-14 Referring Provider (PT): Charlott Rakes MD   Encounter Date: 04/18/2021   PT End of Session - 04/18/21 1339     Visit Number 5    Number of Visits 12    Date for PT Re-Evaluation 05/09/21    Authorization Type [employer]    PT Start Time 7371    PT Stop Time 1430    PT Time Calculation (min) 55 min    Activity Tolerance Patient tolerated treatment well    Behavior During Therapy [facility] for tasks assessed/performed             Past Medical History:  Diagnosis Date   Anxiety    no meds   Asthma    as a child, no inhaler, no problems as adult   Depression    Migraine    otc med prn   PCOS (polycystic ovarian syndrome)     Past Surgical History:  Procedure Laterality Date   LAPAROSCOPIC OVARIAN CYSTECTOMY N/A 02/11/2018   Procedure: LAPAROSCOPIC OVARIAN CYSTECTOMY;  Surgeon: Sloan Leiter, MD;  Location: [facility];  Service: Gynecology;  Laterality: N/A;   WISDOM TOOTH EXTRACTION      There were no vitals filed for this visit.   Subjective Assessment - 04/18/21 1340     Subjective found a baby opposum on my porch. I am better with my pain   I am a 3/10 today    Pertinent History headache  PCOS, major depressive episode, anxiety, suicide ideation, PTSD  See medical history    How long can you sit comfortably? 1-2 hours    How long can you stand comfortably? imporved with your  5 X STS 14.01 today.  If I switch how I ben my knees or wt shift I can stand about 45 min    How long can you walk comfortably? I havent been walking because it has been too hot    Diagnostic tests none - just for PCOS    Patient Stated Goals I want to get my degrees from [employer]    Pain Score 3     Pain Location Back    Pain Orientation  Lower    Pain Descriptors / Indicators Aching    Pain Onset More than a month ago    Pain Frequency Intermittent                [facility] PT Assessment - 04/18/21 0001       Assessment   Medical Diagnosis thoracolumbar pain    Referring Provider (PT) Charlott Rakes MD    Onset Date/Surgical Date 02/20/21   off and on for years but worsening in last couple of months   Hand Dominance Left      Sit to Stand   Comments 5x STS 14.01 second                           [facility] Adult PT Treatment/Exercise - 04/18/21 0001       Lumbar Exercises: Stretches   Figure 4 Stretch 60 seconds    Figure 4 Stretch Limitations bilateral      Lumbar Exercises: Seated   Sit to Stand 10 reps    Sit to Stand Limitations 15 lb and  chest press x 10 for 2 nd set      Lumbar Exercises: Supine   Pelvic Tilt 10 reps    Pelvic Tilt Limitations x2 used ball to press    Bent Knee Raise 10 reps    Bent Knee Raise Limitations x2; with posterior pelvic tilt      Lumbar Exercises: Sidelying   Hip Abduction 10 reps    Hip Abduction Limitations x2; bilateral      Lumbar Exercises: Prone   Opposite Arm/Leg Raise 10 reps    Opposite Arm/Leg Raise Limitations x2      Modalities   Modalities Moist Heat      Moist Heat Therapy   Number Minutes Moist Heat 15 Minutes    Moist Heat Location Lumbar Spine      Manual Therapy   Manual Therapy Soft tissue mobilization    Manual therapy comments skilled palpation for TPDN    Soft tissue mobilization bil QL and thoracic lumbar paraspinals              Trigger Point Dry Needling - 04/18/21 0001     Consent Given? Yes    Education Handout Provided Yes    Muscles Treated Back/Hip Quadratus lumborum;Lumbar multifidi    Dry Needling Comments 50 mm 30 gage, 100 mm 30 gage,    Lumbar multifidi Response Twitch response elicited;Palpable increased muscle length    Quadratus Lumborum Response Twitch response elicited;Palpable increased muscle  length                  PT Education - 04/18/21 1349     Education Details TPDN education and after care and precautions and review of HEP    Person(s) Educated Patient    Methods Explanation;Demonstration;Tactile cues;Verbal cues;Handout    Comprehension Verbalized understanding;Returned demonstration              PT Short Term Goals - 03/28/21 1546       PT SHORT TERM GOAL #1   Title STG=LTG               PT Long Term Goals - 04/18/21 1341       PT LONG TERM GOAL #1   Title Pt will be independent with advanced HEP    Baseline Pt is independent with intial HEP    Time 6    Period Weeks    Status On-going      PT LONG TERM GOAL #2   Title PT will improved your 5 x STS to 13 sec or less    Baseline 04-18-21 14.01    Time 6    Period Weeks    Status Partially Met      PT LONG TERM GOAL #3   Title Pt will be able to transfer out of bed without pain 2/10 or less    Baseline Most days I can do this with minimal pain 2/10 or less as long as I dont have a heavy lifting day    Time 6    Period Weeks    Status Partially Met      PT LONG TERM GOAL #4   Title FOTO will improve from 52% intake   to 68%    indicating improved functional mobility.    Baseline eval intake 52%    Time 6    Period Weeks    Status Unable to assess      PT LONG TERM GOAL #5   Title Pt will be able to  get 7000 steps in a day at minimum using a pedometer for better health    Baseline Pt is doing more of her HEP and I am walking rather than just standing still.  Not keeping up with it    Time 6    Period Weeks    Status On-going                   Plan - 04/18/21 1339     Clinical Impression Statement Pt reports 3/10 back pain and consents to TPDN and is closely monitored. Pt reports feelling relief of tightness and sharp pain post TPDN.  Pt then able to perform HEP with 25% greater ease.  Will continue in remaining visits to manage pain and increase strength for her  ADL which includes heavy lifing of mother rehab equipment.  Pt seemed to be in good mood today and able to exercise with few distractions today.    Personal Factors and Comorbidities Comorbidity 1    Comorbidities headache  PCOS, major depressive episode, anxiety, suicide ideation, PTSD  See medical history    Examination-Activity Limitations Bend;Transfers;Stand;Locomotion Level    Examination-Participation Restrictions Laundry;Other    PT Frequency 2x / week    PT Duration 6 weeks    PT Treatment/Interventions ADLs/Self Care Home Management;Cryotherapy;Electrical Stimulation;Iontophoresis 54m/ml Dexamethasone;Moist Heat;Traction;Therapeutic exercise;Therapeutic activities;Functional mobility training;Stair training;Gait training;Neuromuscular re-education;Patient/family education;Passive range of motion;Manual techniques;Dry needling;Taping;Spinal Manipulations    PT Next Visit Plan contine core and hip strengthening, update HEP; mindful of hx of panic attacks as patient requests to remove mask for some exercises  Goals, FOTO done 6th visit. assess TPDN    PT Home Exercise Plan [email or web address]    Consulted and Agree with Plan of Care Patient             Patient will benefit from skilled therapeutic intervention in order to improve the following deficits and impairments:  Decreased mobility, Decreased range of motion, Decreased strength, Increased muscle spasms, Postural dysfunction, Improper body mechanics, Pain, Obesity  Visit Diagnosis: Pain in thoracic spine  Muscle weakness (generalized)  Cramp and spasm  Abnormal posture  Bilateral low back pain without sciatica, unspecified chronicity Access Code: [email or web address]: [email or web address]: 08/02/2022Prepared by: LLesly RubensteinNotes Exercise sitting with yoga hands. put hand together. press knees together. slowly rotate to R and feel thoracic stretch for 10 sec. Return to middle and rotate to L with knees pressed  together and hold for 10 sec Do 3-5 each side Exercises   Supine Figure 4 Piriformis Stretch - 1-2 x daily - 7 x weekly - 1 sets - 2 reps - 30-60 sec hold  Prone Alternating Arm and Leg Lifts - 1 x daily - 7 x weekly - 3 sets - 10 reps    Problem List Patient Active Problem List   Diagnosis Date Noted   Suicidal ideation 04/08/2020   Major depressive disorder, single episode 04/07/2020   PTSD (post-traumatic stress disorder) 04/07/2020   Generalized anxiety disorder 04/07/2020   Severe episode of recurrent major depressive disorder, without psychotic features ([place]    Morbid obesity ([place] 12/08/2017   PCOS (polycystic ovarian syndrome) 12/08/2017   Headache(784.0) 02/05/2014    LVoncille Lo PT, AAtwoodCertified Exercise Expert for the Aging Adult  04/18/21 2:36 PM Phone: [phone]: 3[facility]GProspect Park [place]Al[place] [address]Phone: [phone]  Fax:  [phone] Name: ASherena MachorroMRN: [id number] of Birth: 828-Dec-1997

## 2021-04-19 ENCOUNTER — Other Ambulatory Visit: Payer: Self-pay | Admitting: Family Medicine

## 2021-04-19 DIAGNOSIS — L700 Acne vulgaris: Secondary | ICD-10-CM

## 2021-04-19 NOTE — Telephone Encounter (Signed)
Requested Prescriptions  Pending Prescriptions Disp Refills  . clindamycin-benzoyl peroxide (BENZACLIN) gel [Pharmacy Med Name: CLINDAMYCIN PHOS-BENZOYL PEROX 1-5 % EXTERNAL GEL] 25 g 1    Sig: APPLY TOPICALLY 2 (TWO) TIMES DAILY.     Dermatology:  Acne preparations Passed - 04/19/2021  4:36 PM      Passed - Valid encounter within last 12 months    Recent Outpatient Visits          4 weeks ago Elevated LFTs   [place], [place], MD   1 month ago Severe episode of recurrent major depressive disorder, without psychotic features [facility])   [place], [place], MD   7 years ago Frequent headaches   [place], NP      Future Appointments            In 1 month Charlott Rakes, MD [place]

## 2021-04-20 ENCOUNTER — Ambulatory Visit: Payer: 59 | Admitting: Physical Therapy

## 2021-04-25 ENCOUNTER — Encounter: Payer: 59 | Admitting: Physical Therapy

## 2021-04-27 ENCOUNTER — Ambulatory Visit: Payer: 59

## 2021-04-27 ENCOUNTER — Ambulatory Visit (INDEPENDENT_AMBULATORY_CARE_PROVIDER_SITE_OTHER): Payer: 59 | Admitting: Nurse Practitioner

## 2021-04-27 ENCOUNTER — Other Ambulatory Visit: Payer: Self-pay

## 2021-04-27 ENCOUNTER — Encounter: Payer: Self-pay | Admitting: Nurse Practitioner

## 2021-04-27 VITALS — BP 124/80 | Ht 65.0 in | Wt 351.0 lb

## 2021-04-27 DIAGNOSIS — R252 Cramp and spasm: Secondary | ICD-10-CM

## 2021-04-27 DIAGNOSIS — M546 Pain in thoracic spine: Secondary | ICD-10-CM | POA: Diagnosis not present

## 2021-04-27 DIAGNOSIS — Z975 Presence of (intrauterine) contraceptive device: Secondary | ICD-10-CM

## 2021-04-27 DIAGNOSIS — M545 Low back pain, unspecified: Secondary | ICD-10-CM

## 2021-04-27 DIAGNOSIS — Z3046 Encounter for surveillance of implantable subdermal contraceptive: Secondary | ICD-10-CM | POA: Diagnosis not present

## 2021-04-27 DIAGNOSIS — M6281 Muscle weakness (generalized): Secondary | ICD-10-CM

## 2021-04-27 DIAGNOSIS — R293 Abnormal posture: Secondary | ICD-10-CM
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
## 2021-04-27 NOTE — Therapy (Signed)
[place], [place], [address] Phone: [phone]   Fax:  [phone]  Physical Therapy Treatment  Patient Details  Name: Chloe Pena MRN: [id number] Date of Birth: [birth year] Referring Provider (PT): Charlott Rakes MD   Encounter Date: 04/27/2021   PT End of Session - 04/27/21 1532     Visit Number 6    Number of Visits 12    Date for PT Re-Evaluation 05/09/21    Authorization Type [employer]    PT Start Time 8016    PT Stop Time 1613    PT Time Calculation (min) 42 min    Activity Tolerance Patient tolerated treatment well    Behavior During Therapy [facility] for tasks assessed/performed             Past Medical History:  Diagnosis Date   Anxiety    no meds   Asthma    as a child, no inhaler, no problems as adult   Depression    Migraine    otc med prn   PCOS (polycystic ovarian syndrome)     Past Surgical History:  Procedure Laterality Date   LAPAROSCOPIC OVARIAN CYSTECTOMY N/A 02/11/2018   Procedure: LAPAROSCOPIC OVARIAN CYSTECTOMY;  Surgeon: Sloan Leiter, MD;  Location: [facility];  Service: Gynecology;  Laterality: N/A;   WISDOM TOOTH EXTRACTION      There were no vitals filed for this visit.   Subjective Assessment - 04/27/21 1533     Subjective "I had to lift up a freezer, you don't want to know." She reports she feels "like shit" and is partly due to her menstrual cycle. She reports this is her first cycle in 3 years due to her birth control. She had a f/u with OB today and has an appointment to get her nexplanon removed and replaced.    Pertinent History headache  PCOS, major depressive episode, anxiety, suicide ideation, PTSD  See medical history    How long can you sit comfortably? 1-2 hours    How long can you stand comfortably? imporved with your  5 X STS 14.01 today.  If I switch how I ben my knees or wt shift I can stand about 45 min    How long can you walk  comfortably? I havent been walking because it has been too hot    Diagnostic tests none - just for PCOS    Patient Stated Goals I want to get my degrees from [employer]    Currently in Pain? Yes    Pain Score 10-Worst pain ever    Pain Location --   reprodutive region   Pain Descriptors / Indicators --   siezing   Pain Type Acute pain    Pain Onset More than a month ago    Pain Frequency Constant                [facility] PT Assessment - 04/27/21 0001       Observation/Other Assessments   Focus on Therapeutic Outcomes (FOTO)  67% function                           [facility] Adult PT Treatment/Exercise - 04/27/21 0001       Self-Care   Other Self-Care Comments  see patient educaton      Lumbar Exercises: Stretches   Other Lumbar Stretch Exercise stability ball rollout 1 min      Lumbar  Exercises: Standing   Other Standing Lumbar Exercises standing march 2 x 10      Lumbar Exercises: Seated   Other Seated Lumbar Exercises pelvic tilts 2 x 10      Lumbar Exercises: Quadruped   Single Arm Raise 10 reps    Straight Leg Raise 10 reps      Knee/Hip Exercises: Standing   Hip Extension 10 reps    Extension Limitations x2 bilateral                    PT Education - 04/27/21 1540     Education Details FOTO score    Person(s) Educated Patient    Methods Explanation    Comprehension Verbalized understanding              PT Short Term Goals - 03/28/21 1546       PT SHORT TERM GOAL #1   Title STG=LTG               PT Long Term Goals - 04/18/21 1341       PT LONG TERM GOAL #1   Title Pt will be independent with advanced HEP    Baseline Pt is independent with intial HEP    Time 6    Period Weeks    Status On-going      PT LONG TERM GOAL #2   Title PT will improved your 5 x STS to 13 sec or less    Baseline 04-18-21 14.01    Time 6    Period Weeks    Status Partially Met      PT LONG TERM GOAL #3   Title Pt will be able to  transfer out of bed without pain 2/10 or less    Baseline Most days I can do this with minimal pain 2/10 or less as long as I dont have a heavy lifting day    Time 6    Period Weeks    Status Partially Met      PT LONG TERM GOAL #4   Title FOTO will improve from 52% intake   to 68%    indicating improved functional mobility.    Baseline eval intake 52%    Time 6    Period Weeks    Status Unable to assess      PT LONG TERM GOAL #5   Title Pt will be able to get 7000 steps in a day at minimum using a pedometer for better health    Baseline Pt is doing more of her HEP and I am walking rather than just standing still.  Not keeping up with it    Time 6    Period Weeks    Status On-going                   Plan - 04/27/21 1540     Clinical Impression Statement Patient arrives with reports of 10/10 pain in her reproductive region due to starting her menstrual cycle, though she tolerated session well today which focused on progressing core/hip strengthening and spinal mobility. Her FOTO score has much improved compared to baseline scoring 67% function having nearly met the predicted outcome score. She is challenged with quadruped core stabilization having difficulty maintaining neutral alignment.    Personal Factors and Comorbidities Comorbidity 1    Comorbidities headache  PCOS, major depressive episode, anxiety, suicide ideation, PTSD  See medical history    Examination-Activity Limitations Bend;Transfers;Stand;Locomotion Level  Examination-Participation Restrictions Laundry;Other    PT Treatment/Interventions ADLs/Self Care Home Management;Cryotherapy;Electrical Stimulation;Iontophoresis 19m/ml Dexamethasone;Moist Heat;Traction;Therapeutic exercise;Therapeutic activities;Functional mobility training;Stair training;Gait training;Neuromuscular re-education;Patient/family education;Passive range of motion;Manual techniques;Dry needling;Taping;Spinal Manipulations    PT Next Visit Plan  contine core and hip strengthening, update HEP; mindful of hx of panic attacks as patient requests to remove mask for some exercises    PT Home Exercise Plan [email or web address]    Consulted and Agree with Plan of Care Patient             Patient will benefit from skilled therapeutic intervention in order to improve the following deficits and impairments:  Decreased mobility, Decreased range of motion, Decreased strength, Increased muscle spasms, Postural dysfunction, Improper body mechanics, Pain, Obesity  Visit Diagnosis: Pain in thoracic spine  Muscle weakness (generalized)  Cramp and spasm  Abnormal posture  Bilateral low back pain without sciatica, unspecified chronicity     Problem List Patient Active Problem List   Diagnosis Date Noted   Suicidal ideation 04/08/2020   Major depressive disorder, single episode 04/07/2020   PTSD (post-traumatic stress disorder) 04/07/2020   Generalized anxiety disorder 04/07/2020   Severe episode of recurrent major depressive disorder, without psychotic features ([place]    Morbid obesity ([place] 12/08/2017   PCOS (polycystic ovarian syndrome) 12/08/2017   Headache(784.0) 02/05/2014  SGwendolyn Grant PT, DPT, ATC 04/27/21 4:15 PM  [facility] [address]Phone: [phone]  Fax:  [phone] Name: AStephaine BreshearsMRN: [id number] of Birth: [birth year]

## 2021-05-01 ENCOUNTER — Other Ambulatory Visit: Payer: Self-pay

## 2021-05-01 ENCOUNTER — Other Ambulatory Visit: Payer: Self-pay | Admitting: Family Medicine

## 2021-05-01 ENCOUNTER — Ambulatory Visit: Payer: 59 | Attending: Family Medicine | Admitting: Clinical

## 2021-05-01 ENCOUNTER — Encounter: Payer: Self-pay | Admitting: Family Medicine

## 2021-05-01 DIAGNOSIS — F419 Anxiety disorder, unspecified: Secondary | ICD-10-CM | POA: Diagnosis not present

## 2021-05-01 DIAGNOSIS — F331 Major depressive disorder, recurrent, moderate: Secondary | ICD-10-CM | POA: Diagnosis not present

## 2021-05-01 MED ORDER — SERTRALINE HCL 50 MG PO TABS
50.0000 mg | ORAL_TABLET | Freq: Every day | ORAL | 2 refills | Status: DC
Start: 2021-05-01 — End: 2021-05-29
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
## 2021-05-02 ENCOUNTER — Encounter: Payer: Self-pay | Admitting: Physical Therapy

## 2021-05-02 ENCOUNTER — Ambulatory Visit: Payer: 59 | Admitting: Physical Therapy

## 2021-05-02 DIAGNOSIS — R252 Cramp and spasm: Secondary | ICD-10-CM

## 2021-05-02 DIAGNOSIS — M546 Pain in thoracic spine: Secondary | ICD-10-CM | POA: Diagnosis not present

## 2021-05-02 DIAGNOSIS — M545 Low back pain, unspecified: Secondary | ICD-10-CM

## 2021-05-02 DIAGNOSIS — R293 Abnormal posture: Secondary | ICD-10-CM

## 2021-05-02 DIAGNOSIS — M6281 Muscle weakness (generalized): Secondary | ICD-10-CM

## 2021-05-02 NOTE — Therapy (Signed)
[place], [place], [address] Phone: [phone]   Fax:  [phone]  Physical Therapy Treatment/Discharge Note  Patient Details  Name: Chloe Pena MRN: [id number] Date of Birth: January 14, 1996 Referring Provider (PT): Charlott Rakes MD   Encounter Date: 05/02/2021   PT End of Session - 05/02/21 1323     Visit Number 7    Number of Visits 12    Date for PT Re-Evaluation 05/09/21    Authorization Type [employer]    PT Start Time 1330    PT Stop Time 1416    PT Time Calculation (min) 46 min    Activity Tolerance Patient tolerated treatment well    Behavior During Therapy [facility] for tasks assessed/performed             Past Medical History:  Diagnosis Date   Anxiety    no meds   Asthma    as a child, no inhaler, no problems as adult   Depression    Migraine    otc med prn   PCOS (polycystic ovarian syndrome)     Past Surgical History:  Procedure Laterality Date   LAPAROSCOPIC OVARIAN CYSTECTOMY N/A 02/11/2018   Procedure: LAPAROSCOPIC OVARIAN CYSTECTOMY;  Surgeon: Sloan Leiter, MD;  Location: [facility];  Service: Gynecology;  Laterality: N/A;   WISDOM TOOTH EXTRACTION      There were no vitals filed for this visit.   Subjective Assessment - 05/02/21 1336     Subjective I have [place] and I have been feeling better regarding my menstrual cycle.  Graduate school starts in a week so I think I am ready to leave PT and start studying    Pertinent History headache  PCOS, major depressive episode, anxiety, suicide ideation, PTSD  See medical history    How long can you sit comfortably? 2 or 2 and 1/2 hours    How long can you stand comfortably? imporved with your  5 X STS 12.08today. 90 minutes for standing    How long can you walk comfortably? now that it has gotten cooler I am ready to go outside and walk    Diagnostic tests none - just for PCOS    Patient Stated Goals I want to get my  degrees from [employer]    Currently in Pain? No/denies    Pain Score 0-No pain    Pain Location Thoracic    Pain Orientation Right;Left                [facility] PT Assessment - 05/02/21 0001       Observation/Other Assessments   Focus on Therapeutic Outcomes (FOTO)  94% function      Sit to Stand   Comments 5x STS 12.08 second      Strength   Overall Strength Deficits    Right Shoulder Flexion 5/5    Right Shoulder ABduction 5/5    Right Shoulder Internal Rotation 5/5    Right Shoulder External Rotation 5/5    Left Shoulder Flexion 5/5    Left Shoulder ABduction 5/5    Left Shoulder Internal Rotation 5/5    Left Shoulder External Rotation 5/5    Right Hip Flexion 5/5    Right Hip Extension 5/5    Right Hip ABduction 5/5    Left Hip Flexion 5/5    Left Hip Extension 5/5    Left Hip ABduction 5/5    Right Knee Flexion 5/5  Left Knee Flexion 5/5    Left Knee Extension 4/5    Right Ankle Dorsiflexion 5/5    Right Ankle Plantar Flexion 5/5    Left Ankle Dorsiflexion 5/5    Left Ankle Plantar Flexion 5/5                           [facility] Adult PT Treatment/Exercise - 05/02/21 0001       Lumbar Exercises: Stretches   Figure 4 Stretch 60 seconds    Figure 4 Stretch Limitations bilateral    Other Lumbar Stretch Exercise stability ball rollout 1 min    Other Lumbar Stretch Exercise standing thoracic stretch for 30 sec x 3 using back of chair.      Lumbar Exercises: Standing   Other Standing Lumbar Exercises standing march 2 x 10    Other Standing Lumbar Exercises standing lunge iwth UE support x 10 each side      Lumbar Exercises: Seated   Sit to Stand 10 reps    Sit to Stand Limitations 25 lb and chest press x 10 for 2 nd set    Other Seated Lumbar Exercises pelvic tilts 2 x 10      Lumbar Exercises: Sidelying   Other Sidelying Lumbar Exercises Thoracic rotation with open Book x 10      Lumbar Exercises: Quadruped   Single Arm Raise 10 reps     Straight Leg Raise 10 reps      Knee/Hip Exercises: Standing   Hip Extension 10 reps    Extension Limitations x2 bilateral                      PT Short Term Goals - 03/28/21 1546       PT SHORT TERM GOAL #1   Title STG=LTG               PT Long Term Goals - 05/02/21 1340       PT LONG TERM GOAL #1   Title Pt will be independent with advanced HEP    Baseline Pt is independent with intial HEP    Period Weeks    Status Achieved      PT LONG TERM GOAL #2   Title PT will improved your 5 x STS to 13 sec or less    Baseline 05-02-21 12.08    Time 6    Period Weeks    Status Achieved      PT LONG TERM GOAL #3   Title Pt will be able to transfer out of bed without pain 2/10 or less    Baseline Pt with no pain simulating transfer out of bed    Time 6    Period Weeks    Status Achieved      PT LONG TERM GOAL #4   Title FOTO will improve from 52% intake   to 68%    indicating improved functional mobility.    Baseline eval intake 52%  11-12-20  94%    Time 6    Period Weeks    Status Achieved      PT LONG TERM GOAL #5   Title Pt will be able to get 7000 steps in a day at minimum using a pedometer for better health    Baseline Pt walks at bus stops ( stepping about 2-3 miles daily plus walking for school    Time 6    Period Weeks  Status Achieved                   Plan - 05/02/21 1324     Clinical Impression Statement Pt arrives with 0/10 pain in thoracic area and is very talkative and animated in her discusssion.  Pt is looking forward to returning to graduate school and she is desiring today to be DC date so she can continue her studies next week.  FOTO score is 94  % from 52% since eval. Pt achieved all LTG's today.  Pt demo getting up from mat supine to sit without turning to side but was able to perfrom with proper form that does not irritate upper back.  Pt MMT much improved 5/5 in LE and UE.  Will DC due to achieved LTG's and being pleased  with current functional level    Personal Factors and Comorbidities Comorbidity 1    Comorbidities headache  PCOS, major depressive episode, anxiety, suicide ideation, PTSD  See medical history    Examination-Activity Limitations [place];Transfers;Stand;Locomotion Level    Examination-Participation Restrictions Laundry;Other    PT Frequency 2x / week    PT Duration 6 weeks    PT Treatment/Interventions ADLs/Self Care Home Management;Cryotherapy;Electrical Stimulation;Iontophoresis 30m/ml Dexamethasone;Moist Heat;Traction;Therapeutic exercise;Therapeutic activities;Functional mobility training;Stair training;Gait training;Neuromuscular re-education;Patient/family education;Passive range of motion;Manual techniques;Dry needling;Taping;Spinal Manipulations    PT Next Visit Plan contine core and hip strengthening, update HEP; mindful of hx of panic attacks as patient requests to remove mask for some exercises    PT Home Exercise Plan [id number]    Consulted and Agree with Plan of Care Patient             Patient will benefit from skilled therapeutic intervention in order to improve the following deficits and impairments:  Decreased mobility, Decreased range of motion, Decreased strength, Increased muscle spasms, Postural dysfunction, Improper body mechanics, Pain, Obesity  Visit Diagnosis: Pain in thoracic spine  Muscle weakness (generalized)  Cramp and spasm  Abnormal posture  Bilateral low back pain without sciatica, unspecified chronicity     Problem List Patient Active Problem List   Diagnosis Date Noted   Suicidal ideation 04/08/2020   Major depressive disorder, single episode 04/07/2020   PTSD (post-traumatic stress disorder) 04/07/2020   Generalized anxiety disorder 04/07/2020   Severe episode of recurrent major depressive disorder, without psychotic features ([place]    Morbid obesity ([place] 12/08/2017   PCOS (polycystic ovarian syndrome) 12/08/2017   Headache(784.0) 02/05/2014   LVoncille Lo PT, AHamptonCertified Exercise Expert for the Aging Adult  05/02/21 2:31 PM Phone: [phone]: 3[facility] [address]Phone: [phone]  Fax:  [phone] Name: ASia GabrielsenMRN: [id number] of Birth: 8May 13, 1997 PHYSICAL THERAPY DISCHARGE SUMMARY  Visits from Start of Care: 7  Current functional level related to goals / functional outcomes: As above   Remaining deficits: None. No pain in thoracic spine   Education / Equipment: HEP   Patient agrees to discharge. Patient goals were met. Patient is being discharged due to meeting the stated rehab goals.  LVoncille Lo PT, AAndersonvilleCertified Exercise Expert for the Aging Adult  05/02/21 2:31 PM Phone: [phone]: [phone]

## 2021-05-04 ENCOUNTER — Ambulatory Visit: Payer: 59

## 2021-05-13 ENCOUNTER — Encounter: Payer: Self-pay | Admitting: Family Medicine

## 2021-05-15 ENCOUNTER — Other Ambulatory Visit: Payer: Self-pay | Admitting: Family Medicine

## 2021-05-15 DIAGNOSIS — S025XXA Fracture of tooth (traumatic), initial encounter for closed fracture: Secondary | ICD-10-CM

## 2021-05-18 ENCOUNTER — Other Ambulatory Visit: Payer: Self-pay

## 2021-05-18 ENCOUNTER — Encounter: Payer: Self-pay | Admitting: Nurse Practitioner

## 2021-05-18 ENCOUNTER — Ambulatory Visit (INDEPENDENT_AMBULATORY_CARE_PROVIDER_SITE_OTHER): Payer: 59 | Admitting: Nurse Practitioner

## 2021-05-18 VITALS — BP 130/84

## 2021-05-18 DIAGNOSIS — Z3046 Encounter for surveillance of implantable subdermal contraceptive: Secondary | ICD-10-CM | POA: Diagnosis not present

## 2021-05-18 NOTE — Progress Notes (Signed)
25 y.o. G0P0000 Caucasian Single female presents for Nexplanon removal and replacement   Procedure, risks and benefits have all been explained.    LMP:  UNK   After all questions were answered, consent was obtained.    Past Medical History:  Diagnosis Date   Anxiety    no meds   Asthma    as a child, no inhaler, no problems as adult   Depression    Migraine    otc med prn   PCOS (polycystic ovarian syndrome)     Past Surgical History:  Procedure Laterality Date   LAPAROSCOPIC OVARIAN CYSTECTOMY N/A 02/11/2018   Procedure: LAPAROSCOPIC OVARIAN CYSTECTOMY;  Surgeon: Sloan Leiter, MD;  Location: [facility];  Service: Gynecology;  Laterality: N/A;   WISDOM TOOTH EXTRACTION      Current Outpatient Medications on File Prior to Visit  Medication Sig Dispense Refill   clindamycin-benzoyl peroxide (BENZACLIN) gel APPLY TOPICALLY 2 (TWO) TIMES DAILY. 25 g 1   lidocaine (LIDODERM) 5 % Place 1 patch onto the skin daily. Remove & Discard patch within 12 hours or as directed by MD 30 patch 3   metFORMIN (GLUCOPHAGE) 500 MG tablet Take 1 tablet (500 mg total) by mouth daily with breakfast. 30 tablet 6   sertraline (ZOLOFT) 50 MG tablet Take 1 tablet (50 mg total) by mouth daily. 30 tablet 2   topiramate (TOPAMAX) 25 MG tablet Take 1 tablet (25 mg total) by mouth daily. 30 tablet 3   tretinoin (RETIN-A) 0.025 % gel Apply topically at bedtime. 45 g 1   No current facility-administered medications on file prior to visit.   No Known Allergies  Vitals:   05/18/21 1003  BP: 130/84   Physical Exam  Procedure: Patient placed supine on exam table with her right arm flexed at the elbow. The prior insertion site was located and the Nexplanon rod was palpated.  Area cleansed with Betadine x 3 and draped in normal sterile fashion.  Insertion site and surrounding tissue anesthetized with 1% Lidocaine without epinephrine, 1 cc total used.  Small incision made with #11 blade.  Nexplanon removed without  difficulty.  New Nexplanon inserted in new location due to current recommendations on site location 8-10 cm from medial epicondyle and 3-5 cm posterior to sulcus. Area cleansed with Betadine x 3 and draped in normal sterile fashion.  Insertion site and surrounding tissue anesthetized with 1% Lidocaine without epinephrine, 1.5 cc total used.  Device inserted without difficulty. Steri-strips were applied to both sites and pressure dressing placed. Entire procedure performed with sterile technique.  Pt tolerated procedure well.  Assessment: Nexplanon Removal and Replacement  Plan:  Post procedure instructions reviewed with pt.  Questions answered.  Pt knows to call with any concerns or questions.

## 2021-05-19 ENCOUNTER — Encounter: Payer: Self-pay | Admitting: Gynecology

## 2021-05-24 ENCOUNTER — Ambulatory Visit: Payer: 59 | Attending: Family Medicine | Admitting: Clinical

## 2021-05-24 ENCOUNTER — Other Ambulatory Visit: Payer: Self-pay

## 2021-05-24 DIAGNOSIS — F411 Generalized anxiety disorder: Secondary | ICD-10-CM

## 2021-05-24 DIAGNOSIS — F331 Major depressive disorder, recurrent, moderate: Secondary | ICD-10-CM | POA: Diagnosis not present
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
## 2021-05-29 ENCOUNTER — Ambulatory Visit: Payer: 59 | Attending: Family Medicine | Admitting: Family Medicine

## 2021-05-29 ENCOUNTER — Encounter: Payer: Self-pay | Admitting: Family Medicine

## 2021-05-29 ENCOUNTER — Other Ambulatory Visit: Payer: Self-pay

## 2021-05-29 VITALS — BP 153/87 | HR 91 | Resp 16 | Wt 358.4 lb

## 2021-05-29 DIAGNOSIS — L700 Acne vulgaris: Secondary | ICD-10-CM

## 2021-05-29 DIAGNOSIS — G8929 Other chronic pain: Secondary | ICD-10-CM

## 2021-05-29 DIAGNOSIS — M25562 Pain in left knee: Secondary | ICD-10-CM

## 2021-05-29 DIAGNOSIS — F419 Anxiety disorder, unspecified: Secondary | ICD-10-CM

## 2021-05-29 DIAGNOSIS — F32A Depression, unspecified: Secondary | ICD-10-CM

## 2021-05-29 DIAGNOSIS — R030 Elevated blood-pressure reading, without diagnosis of hypertension: Secondary | ICD-10-CM

## 2021-05-29 DIAGNOSIS — E282 Polycystic ovarian syndrome: Secondary | ICD-10-CM

## 2021-05-29 DIAGNOSIS — G43709 Chronic migraine without aura, not intractable, without status migrainosus: Secondary | ICD-10-CM

## 2021-05-29 DIAGNOSIS — Z6841 Body Mass Index (BMI) 40.0 and over, adult: Secondary | ICD-10-CM

## 2021-05-29 DIAGNOSIS — M546 Pain in thoracic spine: Secondary | ICD-10-CM

## 2021-05-29 DIAGNOSIS — M545 Low back pain, unspecified: Secondary | ICD-10-CM

## 2021-05-29 MED ORDER — LIDOCAINE 5 % EX PTCH: 1.0000 | MEDICATED_PATCH | CUTANEOUS | 6 refills | Status: DC

## 2021-05-29 MED ORDER — METFORMIN HCL 500 MG PO TABS: 500.0000 mg | ORAL_TABLET | Freq: Every day | ORAL | 6 refills | Status: DC

## 2021-05-29 MED ORDER — TOPIRAMATE 25 MG PO TABS: 25.0000 mg | ORAL_TABLET | Freq: Every day | ORAL | 6 refills | Status: DC

## 2021-05-29 MED ORDER — SERTRALINE HCL 50 MG PO TABS: 50.0000 mg | ORAL_TABLET | Freq: Every day | ORAL | 6 refills | Status: DC

## 2021-05-29 MED ORDER — CLINDAMYCIN PHOS-BENZOYL PEROX 1-5 % EX GEL: Freq: Two times a day (BID) | CUTANEOUS | 6 refills | Status: DC

## 2021-05-29 MED ORDER — MISC. DEVICES MISC
0 refills | Status: DC
Start: 2021-05-29 — End: 2021-06-15

## 2021-05-29 MED ORDER — TRETINOIN 0.025 % EX GEL: Freq: Every day | CUTANEOUS | 6 refills | Status: DC

## 2021-05-29 NOTE — Progress Notes (Signed)
Subjective:  Patient ID: Chloe Pena, female    DOB: 16-Nov-1995  Age: 25 y.o. MRN: [id number]  CC: Follow-up (Patient has a question about medication she is taking. )   HPI Natalee Taormina is a 25 y.o. year old female with a history of anxiety and depression, migraines, acne, PCOS, chronic back pain here for follow-up visit. She had COVID 19 infection last month which she contracted from her mother. She was sick at home with predominantly dyspnea and cough as her symptoms. States she took her Aurelia as her mother had to be admitted to the hospital.  Interval History: She feels better since her COVID 19 infection and has no residual symptoms. She is in [employer] and complains of being stressed.  PT did help her chronic back pain.  I had placed her on Cymbalta for both analgesic benefit and antidepressant benefit however she was unable to tolerate Cymbalta and had to be switched to Zoloft which she took in the past. Her Acne has improved on her current regimen. Currently on Metformin for PCOS but has been unable to lose much weight.  She tries to eat healthy except when she was sick with COVID-19.  I had referred her to the medical weight management clinic but she is yet to hear from them.  Her L knee bends so far backwards after she has been on it for a long time with associated pain.  PT recommends knee brace to assist with this. Past Medical History:  Diagnosis Date   Anxiety    no meds   Asthma    as a child, no inhaler, no problems as adult   Depression    Migraine    otc med prn   PCOS (polycystic ovarian syndrome)     Past Surgical History:  Procedure Laterality Date   LAPAROSCOPIC OVARIAN CYSTECTOMY N/A 02/11/2018   Procedure: LAPAROSCOPIC OVARIAN CYSTECTOMY;  Surgeon: Sloan Leiter, MD;  Location: [facility];  Service: Gynecology;  Laterality: N/A;   WISDOM TOOTH EXTRACTION      Family History  Problem Relation Age of Onset   Migraines Mother     GER disease Mother    Heart disease Mother    Heart failure Mother    Diabetes Father    Thyroid disease Brother    Breast cancer Maternal Grandmother    Hypertension Maternal Grandmother    Diabetes Maternal Grandmother     No Known Allergies  Outpatient Medications Prior to Visit  Medication Sig Dispense Refill   clindamycin-benzoyl peroxide (BENZACLIN) gel APPLY TOPICALLY 2 (TWO) TIMES DAILY. 25 g 1   lidocaine (LIDODERM) 5 % Place 1 patch onto the skin daily. Remove & Discard patch within 12 hours or as directed by MD 30 patch 3   metFORMIN (GLUCOPHAGE) 500 MG tablet Take 1 tablet (500 mg total) by mouth daily with breakfast. 30 tablet 6   sertraline (ZOLOFT) 50 MG tablet Take 1 tablet (50 mg total) by mouth daily. 30 tablet 2   topiramate (TOPAMAX) 25 MG tablet Take 1 tablet (25 mg total) by mouth daily. 30 tablet 3   tretinoin (RETIN-A) 0.025 % gel Apply topically at bedtime. 45 g 1   No facility-administered medications prior to visit.     ROS Review of Systems  Constitutional:  Negative for activity change, appetite change and fatigue.  HENT:  Negative for congestion, sinus pressure and sore throat.   Eyes:  Negative for visual disturbance.  Respiratory:  Negative for  cough, chest tightness, shortness of breath and wheezing.   Cardiovascular:  Negative for chest pain and palpitations.  Gastrointestinal:  Negative for abdominal distention, abdominal pain and constipation.  Endocrine: Negative for polydipsia.  Genitourinary:  Negative for dysuria and frequency.  Musculoskeletal:        See HPI  Skin:  Positive for rash.  Neurological:  Negative for tremors, light-headedness and numbness.  Hematological:  Does not bruise/bleed easily.  Psychiatric/Behavioral:  Negative for agitation and behavioral problems.    Objective:  BP (!) 153/87   Pulse 91   Resp 16   Wt (!) 358 lb 6.4 oz (162.6 kg)   SpO2 97%   BMI 59.64 kg/m   BP/Weight 05/29/2021 05/18/2021 [id number]   Systolic BP 0000000 [id number]  Diastolic BP 87 84 80  Wt. (Lbs) 358.4 - 351  BMI 59.64 - 58.41  Some encounter information is confidential and restricted. Go to Review Flowsheets activity to see all data.      Physical Exam Constitutional:      Appearance: She is well-developed. She is obese.  Cardiovascular:     Rate and Rhythm: Normal rate.     Heart sounds: Normal heart sounds. No murmur heard. Pulmonary:     Effort: Pulmonary effort is normal.     Breath sounds: Normal breath sounds. No wheezing or rales.  Chest:     Chest wall: No tenderness.  Abdominal:     General: Bowel sounds are normal. There is no distension.     Palpations: Abdomen is soft. There is no mass.     Tenderness: There is no abdominal tenderness.  Musculoskeletal:        General: Normal range of motion.     Right lower leg: No edema.     Left lower leg: No edema.  Skin:    Comments: Acne lesions on face  Neurological:     Mental Status: She is alert and oriented to person, place, and time.  Psychiatric:        Mood and Affect: Mood normal.    CMP Latest Ref Rng & Units 03/13/2021 04/06/2020 12/30/2017  Glucose 65 - 99 mg/dL 77 96 -  BUN 6 - 20 mg/dL 13 12 -  Creatinine 0.57 - 1.00 mg/dL 0.95 1.03(H) -  Sodium 134 - 144 mmol/L 139 140 -  Potassium 3.5 - 5.2 mmol/L 4.6 3.7 -  Chloride 96 - 106 mmol/L 101 105 -  CO2 20 - 29 mmol/L 21 23 -  Calcium 8.7 - 10.2 mg/dL 10.0 9.1 -  Total Protein 6.0 - 8.5 g/dL 7.8 7.0 7.5  Total Bilirubin 0.0 - 1.2 mg/dL 1.3(H) 1.5(H) 0.9  Alkaline Phos 44 - 121 IU/L 119 92 108  AST 0 - 40 IU/L 30 40 49(H)  ALT 0 - 32 IU/L 52(H) 76(H) 94(H)    Lipid Panel     Component Value Date/Time   CHOL 262 (H) 04/06/2020 0235   TRIG 169 (H) 04/06/2020 0235   HDL 36 (L) 04/06/2020 0235   CHOLHDL 7.3 04/06/2020 0235   VLDL 34 04/06/2020 0235   LDLCALC 192 (H) 04/06/2020 0235    CBC    Component Value Date/Time   WBC 10.4 03/13/2021 1603   WBC 12.0 (H) 02/03/2018 0830    RBC 4.73 03/13/2021 1603   RBC 3.83 (L) 02/03/2018 0830   HGB 14.3 03/13/2021 1603   HCT 42.5 03/13/2021 1603   PLT 280 03/13/2021 1603   MCV 90 03/13/2021 1603  MCH 30.2 03/13/2021 1603   MCH 30.8 02/03/2018 0830   MCHC 33.6 03/13/2021 1603   MCHC 32.1 02/03/2018 0830   RDW 12.2 03/13/2021 1603   LYMPHSABS 3.1 03/13/2021 1603   MONOABS 0.7 03/09/2009 0803   EOSABS 0.2 03/13/2021 1603   BASOSABS 0.1 03/13/2021 1603    Lab Results  Component Value Date   HGBA1C 5.6 03/13/2021    Assessment & Plan:  1. Acne vulgaris Controlled Continue current regimen - clindamycin-benzoyl peroxide (BENZACLIN) gel; Apply topically 2 (two) times daily.  Dispense: 25 g; Refill: 6 - tretinoin (RETIN-A) 0.025 % gel; Apply topically at bedtime.  Dispense: 45 g; Refill: 6  2. PCOS (polycystic ovarian syndrome) Stable - metFORMIN (GLUCOPHAGE) 500 MG tablet; Take 1 tablet (500 mg total) by mouth daily with breakfast.  Dispense: 30 tablet; Refill: 6  3. Thoracolumbar back pain Improved after PT Continue home exercises and proper lifting techniques - lidocaine (LIDODERM) 5 %; Place 1 patch onto the skin daily. Remove & Discard patch within 12 hours or as directed by MD  Dispense: 30 patch; Refill: 6  4. Anxiety and depression Controlled - sertraline (ZOLOFT) 50 MG tablet; Take 1 tablet (50 mg total) by mouth daily.  Dispense: 30 tablet; Refill: 6  5. Morbid obesity ([place]) Provided phone number to medical weight management Continue to work on caloric restrictions and exercise  6. Chronic pain of left knee She does have associated hyperextension of her left knee - Misc. Devices MISC; Left knee brace.  Size- Large  Dispense: 1 each; Refill: 0  7. Elevated blood pressure reading Previous blood pressure was controlled She attributes elevation to being angry this morning Consider addition of spironolactone if blood pressure remains elevated at next visit especially in the light of her PCOS  8.  Chronic migraine without aura without status migrainosus, not intractable Controlled - topiramate (TOPAMAX) 25 MG tablet; Take 1 tablet (25 mg total) by mouth daily.  Dispense: 30 tablet; Refill: 6   Counseled about the need for Pap smear however she declines due to previous history of trauma.  She would like her mother with her for her Pap smear.  She will also need a repeat lipid panel at her next visit. Meds ordered this encounter  Medications   clindamycin-benzoyl peroxide (BENZACLIN) gel    Sig: Apply topically 2 (two) times daily.    Dispense:  25 g    Refill:  6   sertraline (ZOLOFT) 50 MG tablet    Sig: Take 1 tablet (50 mg total) by mouth daily.    Dispense:  30 tablet    Refill:  6   topiramate (TOPAMAX) 25 MG tablet    Sig: Take 1 tablet (25 mg total) by mouth daily.    Dispense:  30 tablet    Refill:  6   tretinoin (RETIN-A) 0.025 % gel    Sig: Apply topically at bedtime.    Dispense:  45 g    Refill:  6   metFORMIN (GLUCOPHAGE) 500 MG tablet    Sig: Take 1 tablet (500 mg total) by mouth daily with breakfast.    Dispense:  30 tablet    Refill:  6   lidocaine (LIDODERM) 5 %    Sig: Place 1 patch onto the skin daily. Remove & Discard patch within 12 hours or as directed by MD    Dispense:  30 patch    Refill:  6   Misc. Devices MISC    Sig: Left knee brace.  Size- Large    Dispense:  1 each    Refill:  0    Follow-up: Return in about 6 months (around 11/26/2021) for Medical condition.       Charlott Rakes, MD, FAAFP. [facility] [place] [facility], [place]   05/29/2021, 10:30 AM

## 2021-05-29 NOTE — Patient Instructions (Signed)
Medical Weight Management  [address] [place] [address] PH# [phone]

## 2021-05-31 DIAGNOSIS — Z0289 Encounter for other administrative examinations: Secondary | ICD-10-CM

## 2021-06-13 ENCOUNTER — Ambulatory Visit: Payer: Self-pay | Admitting: Family Medicine

## 2021-06-15 ENCOUNTER — Encounter (INDEPENDENT_AMBULATORY_CARE_PROVIDER_SITE_OTHER): Payer: Self-pay | Admitting: Bariatrics

## 2021-06-15 ENCOUNTER — Ambulatory Visit (INDEPENDENT_AMBULATORY_CARE_PROVIDER_SITE_OTHER): Payer: 59 | Admitting: Bariatrics

## 2021-06-15 ENCOUNTER — Other Ambulatory Visit: Payer: Self-pay

## 2021-06-15 VITALS — BP 129/85 | HR 76 | Temp 98.3°F | Ht 65.0 in | Wt 349.0 lb

## 2021-06-15 DIAGNOSIS — E559 Vitamin D deficiency, unspecified: Secondary | ICD-10-CM

## 2021-06-15 DIAGNOSIS — Z9189 Other specified personal risk factors, not elsewhere classified: Secondary | ICD-10-CM

## 2021-06-15 DIAGNOSIS — E7800 Pure hypercholesterolemia, unspecified: Secondary | ICD-10-CM

## 2021-06-15 DIAGNOSIS — R7309 Other abnormal glucose: Secondary | ICD-10-CM

## 2021-06-15 DIAGNOSIS — R0602 Shortness of breath: Secondary | ICD-10-CM | POA: Diagnosis not present

## 2021-06-15 DIAGNOSIS — E282 Polycystic ovarian syndrome: Secondary | ICD-10-CM

## 2021-06-15 DIAGNOSIS — R5383 Other fatigue: Secondary | ICD-10-CM

## 2021-06-15 DIAGNOSIS — Z1331 Encounter for screening for depression: Secondary | ICD-10-CM | POA: Diagnosis not present

## 2021-06-15 DIAGNOSIS — F5089 Other specified eating disorder: Secondary | ICD-10-CM

## 2021-06-15 DIAGNOSIS — Z6841 Body Mass Index (BMI) 40.0 and over, adult: Secondary | ICD-10-CM

## 2021-06-15 NOTE — Progress Notes (Signed)
Chief Complaint:   OBESITY Chloe Pena (MR# [id number]) is a 25 y.o. female who presents for evaluation and treatment of obesity and related comorbidities. Current BMI is Body mass index is 58.08 kg/m. Maleni has been struggling with her weight for many years and has been unsuccessful in either losing weight, maintaining weight loss, or reaching her healthy weight goal.  Ladan states that she has to cook secondary to Mother's dietary restrictions. She typically craves strawberries. She skips lunch most days.   Jasalyn is currently in the action stage of change and ready to dedicate time achieving and maintaining a healthier weight. Siri is interested in becoming our patient and working on intensive lifestyle modifications including (but not limited to) diet and exercise for weight loss.  Hopie's habits were reviewed today and are as follows: Her family eats meals together, she thinks her family will eat healthier with her, she struggles with family and or coworkers weight loss sabotage, her desired weight loss is 108 pounds, she has been heavy most of her life, she started gaining weight at the age of 31, her heaviest weight ever was 358 pounds, she has significant food cravings issues, she skips meals frequently, she is frequently drinking liquids with calories, and she frequently eats larger portions than normal.  Depression Screen Jaiyla's Food and Mood (modified PHQ-9) score was 9.  Depression screen [facility] 2/9 06/15/2021  Decreased Interest 0  Down, Depressed, Hopeless 2  PHQ - 2 Score 2  Altered sleeping 0  Tired, decreased energy 1  Change in appetite 3  Feeling bad or failure about yourself  2  Trouble concentrating 0  Moving slowly or fidgety/restless 0  Suicidal thoughts 1  PHQ-9 Score 9  Difficult doing work/chores Somewhat difficult  Some recent data might be hidden   Subjective:   1. Other fatigue Cherita reports daytime somnolence and reports waking up still  tired. Patent has a history of symptoms of morning headache. Doranne generally gets 8 hours of sleep per night, and states that she has difficulty falling asleep. Snoring is present. Apneic episodes is not present. Epworth Sleepiness Score is 5.   2. SOB (shortness of breath) on exertion Lalita notes increasing shortness of breath with exercising and seems to be worsening over time with weight gain. She notes getting out of breath sooner with activity than she used to. This has not gotten worse recently. Alliya denies shortness of breath at rest or orthopnea.   3. PCOS (polycystic ovarian syndrome) Trudie is taking Metformin currently.   4. Vitamin D deficiency Zala is currently not taking any Vitamin D supplements.  5. Elevated glucose Saaya has a history of elevated glucose readings.  6. Elevated cholesterol Monserrath has a history of elevated cholesterol.  7. Other disorder of eating Prabhleen notes some stress eating. She is seeing a Social worker, but no counseling related to food.  8. At risk for activity intolerance Lulubelle is at risk for activity intolerance due to obesity.  Assessment/Plan:   1. Other fatigue Journi does feel that her weight is causing her energy to be lower than it should be. Fatigue may be related to obesity, depression or many other causes. Labs will be ordered, and in the meanwhile, Daphanie will focus on self care including making healthy food choices, increasing physical activity and focusing on stress reduction.  - EKG 12-Lead - T3 - T4, free - TSH  2. SOB (shortness of breath) on exertion Tymia does feel that she gets out  of breath more easily that she used to when she exercises. Jaymie's shortness of breath appears to be obesity related and exercise induced. She has agreed to work on weight loss and gradually increase exercise to treat her exercise induced shortness of breath. Will continue to monitor closely.   3. PCOS (polycystic ovarian syndrome) Intensive lifestyle  modifications are first line treatment for this issue. Annalea will continue Metformin. We discussed several lifestyle modifications today and she will continue to work on diet, exercise and weight loss efforts. Orders and follow up as documented in patient record.  Counseling PCOS is a leading cause of menstrual irregularities and infertility. It is also associated with obesity, hirsutism (excessive hair growth on the face, chest, or back), and cardiovascular risk factors such as high cholesterol and insulin resistance. Insulin resistance appears to play a central role.  Women with PCOS have been shown to have impaired appetite-regulating hormones. Metformin is one medication that can improve metabolic parameters.  Women with polycystic ovary syndrome (PCOS) have an increased risk for cardiovascular disease (CVD) - European Journal of Preventive Cardiology.   - Comprehensive metabolic panel - Hemoglobin A1c - Insulin, random  4. Vitamin D deficiency Low Vitamin D level contributes to fatigue and are associated with obesity, breast, and colon cancer. We will check Vitamin D and Arnice will follow-up for routine testing of Vitamin D, at least 2-3 times per year to avoid over-replacement.  - VITAMIN D 25 Hydroxy (Vit-D Deficiency, Fractures)  5. Elevated glucose We will check A1C and Insulin today. Bryna will continue to follow up as directed.  6. Elevated cholesterol Cardiovascular risk and specific lipid/LDL goals reviewed. We discussed several lifestyle modifications today. We will check Lipids today. Chellsie will continue to work on diet, exercise and weight loss efforts. Orders and follow up as documented in patient record.   Counseling Intensive lifestyle modifications are the first line treatment for this issue. Dietary changes: Increase soluble fiber. Decrease simple carbohydrates. Exercise changes: Moderate to vigorous-intensity aerobic activity 150 minutes per week if  tolerated. Lipid-lowering medications: see documented in medical record.  - Comprehensive metabolic panel - Lipid Panel With LDL/HDL Ratio  7. Other disorder of eating A referral to our Bariatric Psychologist, Dr. Mallie Mussel was placed. Behavior modification techniques were discussed today to help Larose deal with her emotional/non-hunger eating behaviors. Orders and follow up as documented in patient record.    8. Depression screen Arlyss had a positive depression screening. Depression is commonly associated with obesity and often results in emotional eating behaviors. We will monitor this closely and work on CBT to help improve the non-hunger eating patterns. Referral to Psychology may be required if no improvement is seen as she continues in our clinic.   9. At risk for activity intolerance Yaiza was given approximately 15 minutes of exercise intolerance counseling today. She is 25 y.o. female and has risk factors exercise intolerance including obesity. We discussed intensive lifestyle modifications today with an emphasis on specific weight loss instructions and strategies. Alvine will slowly increase activity as tolerated.  Repetitive spaced learning was employed today to elicit superior memory formation and behavioral change.   10. Class 3 severe obesity with serious comorbidity and body mass index (BMI) of 50.0 to 59.9 in adult, unspecified obesity type (HCC) Lasondra is currently in the action stage of change and her goal is to continue with weight loss efforts. I recommend Caili begin the structured treatment plan as follows:  She has agreed to the Category 4 Plan  plus 100 calories.   Mariselda will continue meal planning. She will have no sugary drinks.   Exercise goals: No exercise has been prescribed at this time.   Behavioral modification strategies: increasing lean protein intake, decreasing simple carbohydrates, increasing vegetables, increasing water intake, decreasing eating out, no  skipping meals, meal planning and cooking strategies, keeping healthy foods in the home, and planning for success.  She was informed of the importance of frequent follow-up visits to maximize her success with intensive lifestyle modifications for her multiple health conditions. She was informed we would discuss her lab results at her next visit unless there is a critical issue that needs to be addressed sooner. Pantera agreed to keep her next visit at the agreed upon time to discuss these results.  Objective:   Blood pressure 129/85, pulse 76, temperature 98.3 F (36.8 C), height 5\' 5"  (1.651 m), weight (!) 349 lb (158.3 kg), SpO2 99 %. Body mass index is 58.08 kg/m.  EKG: Normal sinus rhythm, rate 82 bpm.  Indirect Calorimeter completed today shows a VO2 of 337 and a REE of 2333.  Her calculated basal metabolic rate is 1030 thus her basal metabolic rate is worse than expected.  General: Cooperative, alert, well developed, in no acute distress. HEENT: Conjunctivae and lids unremarkable. Cardiovascular: Regular rhythm.  Lungs: Normal work of breathing. Neurologic: No focal deficits.   Lab Results  Component Value Date   CREATININE 0.95 03/13/2021   BUN 13 03/13/2021   NA 139 03/13/2021   K 4.6 03/13/2021   CL 101 03/13/2021   CO2 21 03/13/2021   Lab Results  Component Value Date   ALT 52 (H) 03/13/2021   AST 30 03/13/2021   ALKPHOS 119 03/13/2021   BILITOT 1.3 (H) 03/13/2021   Lab Results  Component Value Date   HGBA1C 5.6 03/13/2021   HGBA1C 5.6 04/06/2020   HGBA1C 5.2 12/30/2017   HGBA1C 5.0 02/03/2014   No results found for: INSULIN Lab Results  Component Value Date   TSH 2.530 03/13/2021   Lab Results  Component Value Date   CHOL 262 (H) 04/06/2020   HDL 36 (L) 04/06/2020   LDLCALC 192 (H) 04/06/2020   TRIG 169 (H) 04/06/2020   CHOLHDL 7.3 04/06/2020   Lab Results  Component Value Date   WBC 10.4 03/13/2021   HGB 14.3 03/13/2021   HCT 42.5 03/13/2021    MCV 90 03/13/2021   PLT 280 03/13/2021   No results found for: IRON, TIBC, FERRITIN  Attestation Statements:   Reviewed by clinician on day of visit: allergies, medications, problem list, medical history, surgical history, family history, social history, and previous encounter notes.  I, Lizbeth Bark, RMA, am acting as Location manager for [employer], DO.   I have reviewed the above documentation for accuracy and completeness, and I agree with the above. Jearld Lesch, DO

## 2021-06-16 ENCOUNTER — Encounter (INDEPENDENT_AMBULATORY_CARE_PROVIDER_SITE_OTHER): Payer: Self-pay | Admitting: Bariatrics

## 2021-06-21 ENCOUNTER — Other Ambulatory Visit: Payer: Self-pay

## 2021-06-21 ENCOUNTER — Ambulatory Visit: Payer: 59 | Attending: Family Medicine | Admitting: Clinical

## 2021-06-21 DIAGNOSIS — F411 Generalized anxiety disorder: Secondary | ICD-10-CM

## 2021-06-21 DIAGNOSIS — F331 Major depressive disorder, recurrent, moderate: Secondary | ICD-10-CM | POA: Diagnosis not present

## 2021-06-23 LAB — COMPREHENSIVE METABOLIC PANEL
ALT: 29 IU/L (ref 0–32)
AST: 25 IU/L (ref 0–40)
Albumin/Globulin Ratio: 1.8 (ref 1.2–2.2)
Albumin: 4.8 g/dL (ref 3.9–5.0)
Alkaline Phosphatase: 112 IU/L (ref 44–121)
BUN/Creatinine Ratio: 14 (ref 9–23)
BUN: 13 mg/dL (ref 6–20)
Bilirubin Total: 1.1 mg/dL (ref 0.0–1.2)
CO2: 18 mmol/L — ABNORMAL LOW (ref 20–29)
Calcium: 9.4 mg/dL (ref 8.7–10.2)
Chloride: 104 mmol/L (ref 96–106)
Creatinine, Ser: 0.91 mg/dL (ref 0.57–1.00)
Globulin, Total: 2.6 g/dL (ref 1.5–4.5)
Glucose: 76 mg/dL (ref 70–99)
Potassium: 4.5 mmol/L (ref 3.5–5.2)
Sodium: 140 mmol/L (ref 134–144)
Total Protein: 7.4 g/dL (ref 6.0–8.5)
eGFR: 89 mL/min/{1.73_m2} (ref >59)

## 2021-06-23 LAB — LIPID PANEL WITH LDL/HDL RATIO
Cholesterol, Total: 247 mg/dL — ABNORMAL HIGH (ref 100–199)
HDL: 37 mg/dL — ABNORMAL LOW (ref >39)
LDL Chol Calc (NIH): 186 mg/dL — ABNORMAL HIGH (ref 0–99)
LDL/HDL Ratio: 5 ratio — ABNORMAL HIGH (ref 0.0–3.2)
Triglycerides: 129 mg/dL (ref 0–149)
VLDL Cholesterol Cal: 24 mg/dL (ref 5–40)

## 2021-06-23 LAB — T3: T3, Total: 166 ng/dL (ref 71–180)

## 2021-06-23 LAB — TSH: TSH: 2.2 u[IU]/mL (ref 0.450–4.500)

## 2021-06-23 LAB — T4, FREE: Free T4: 1.17 ng/dL (ref 0.82–1.77)

## 2021-06-23 LAB — HEMOGLOBIN A1C
Est. average glucose Bld gHb Est-mCnc: 111 mg/dL
Hgb A1c MFr Bld: 5.5 % (ref 4.8–5.6)

## 2021-06-23 LAB — INSULIN, RANDOM: INSULIN: 44.1 u[IU]/mL — ABNORMAL HIGH (ref 2.6–24.9)

## 2021-06-23 LAB — VITAMIN D 25 HYDROXY (VIT D DEFICIENCY, FRACTURES): Vit D, 25-Hydroxy: 24.4 ng/mL — ABNORMAL LOW (ref 30.0–100.0)

## 2021-06-26 ENCOUNTER — Encounter (INDEPENDENT_AMBULATORY_CARE_PROVIDER_SITE_OTHER): Payer: Self-pay | Admitting: Bariatrics

## 2021-06-26 DIAGNOSIS — E8881 Metabolic syndrome: Secondary | ICD-10-CM | POA: Insufficient documentation

## 2021-06-26 DIAGNOSIS — E559 Vitamin D deficiency, unspecified: Secondary | ICD-10-CM | POA: Insufficient documentation

## 2021-06-26 DIAGNOSIS — E88819 Insulin resistance, unspecified: Secondary | ICD-10-CM | POA: Insufficient documentation

## 2021-06-26 DIAGNOSIS — E785 Hyperlipidemia, unspecified: Secondary | ICD-10-CM | POA: Insufficient documentation

## 2021-06-26 DIAGNOSIS — E7800 Pure hypercholesterolemia, unspecified: Secondary | ICD-10-CM | POA: Insufficient documentation

## 2021-06-29 ENCOUNTER — Other Ambulatory Visit: Payer: Self-pay

## 2021-06-29 ENCOUNTER — Ambulatory Visit (INDEPENDENT_AMBULATORY_CARE_PROVIDER_SITE_OTHER): Payer: 59 | Admitting: Bariatrics

## 2021-06-29 ENCOUNTER — Encounter (INDEPENDENT_AMBULATORY_CARE_PROVIDER_SITE_OTHER): Payer: Self-pay | Admitting: Bariatrics

## 2021-06-29 VITALS — BP 128/75 | HR 89 | Temp 98.8°F | Ht 65.0 in | Wt 350.0 lb

## 2021-06-29 DIAGNOSIS — Z6841 Body Mass Index (BMI) 40.0 and over, adult: Secondary | ICD-10-CM

## 2021-06-29 DIAGNOSIS — E559 Vitamin D deficiency, unspecified: Secondary | ICD-10-CM

## 2021-06-29 DIAGNOSIS — E8881 Metabolic syndrome: Secondary | ICD-10-CM

## 2021-06-29 MED ORDER — VITAMIN D (ERGOCALCIFEROL) 1.25 MG (50000 UNIT) PO CAPS
50000.0000 [IU] | ORAL_CAPSULE | ORAL | 0 refills | Status: DC
Start: 2021-06-29 — End: 2021-07-17
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
## 2021-07-03 NOTE — Progress Notes (Signed)
Chief Complaint:   OBESITY Chloe Pena is here to discuss her progress with her obesity treatment plan along with follow-up of her obesity related diagnoses. Chloe Pena is on the Category 4 Plan plus 100 calories and states she is following her eating plan approximately 80% of the time. Chloe Pena states she is doing household chores.  Today's visit was #: 3 Starting weight: 349 lbs Starting date: 06/15/2021 Today's weight: 350 lbs Today's date: 06/29/2021 Total lbs lost to date: 0 Total lbs lost since last in-office visit: 0  Interim History: Chloe Pena is up 1 lb. She states that the protein shakes did not taste well.  Subjective:   1. Insulin resistance Goldy is taking Glucophage currently. Her last Insulin resistance level was 44.1. Her A1C level was 5.5. Handout on Insulin resistance and pre-diabetes were given out today.  2. Vitamin D insufficiency [place] is not taking Vitamin D currently. Her last Vitamin D level was 24.2.  Assessment/Plan:   1. Insulin resistance Chloe Pena will continue to work on weight loss, exercise, and decreasing simple carbohydrates to help decrease the risk of diabetes. She will increase protein and fats. Chloe Pena agreed to follow-up with Chloe Pena as directed to closely monitor her progress.  2. Vitamin D insufficiency Low Vitamin D level contributes to fatigue and are associated with obesity, breast, and colon cancer. Chloe Pena agreed to start taking prescription Vitamin D 50,000 IU every week for 1 month with no refills and she will follow-up for routine testing of Vitamin D, at least 2-3 times per year to avoid over-replacement.  - Vitamin D, Ergocalciferol, (DRISDOL) 1.25 MG (50000 UNIT) CAPS capsule; Take 1 capsule (50,000 Units total) by mouth every 7 (seven) days.  Dispense: 4 capsule; Refill: 0  3. Obesity with current BMI of 58.2 Chloe Pena is currently in the action stage of change. As such, her goal is to continue with weight loss efforts. She has agreed to the Category 3  Plan.   Chloe Pena will continue meal planning. She will adhere to the plan at 80-90%. We review labs today. She will have a protein shake at lunch.  Exercise goals:  As is.  Behavioral modification strategies: increasing lean protein intake, decreasing simple carbohydrates, increasing vegetables, increasing water intake, decreasing eating out, no skipping meals, meal planning and cooking strategies, keeping healthy foods in the home, and planning for success.  Chloe Pena has agreed to follow-up with our clinic in 2 weeks. She was informed of the importance of frequent follow-up visits to maximize her success with intensive lifestyle modifications for her multiple health conditions.   Objective:   Blood pressure 128/75, pulse 89, temperature 98.8 F (37.1 C), height 5\' 5"  (1.651 m), weight (!) 350 lb (158.8 kg), SpO2 99 %. Body mass index is 58.24 kg/m.  General: Cooperative, alert, well developed, in no acute distress. HEENT: Conjunctivae and lids unremarkable. Cardiovascular: Regular rhythm.  Lungs: Normal work of breathing. Neurologic: No focal deficits.   Lab Results  Component Value Date   CREATININE 0.91 06/22/2021   BUN 13 06/22/2021   NA 140 06/22/2021   K 4.5 06/22/2021   CL 104 06/22/2021   CO2 18 (L) 06/22/2021   Lab Results  Component Value Date   ALT 29 06/22/2021   AST 25 06/22/2021   ALKPHOS 112 06/22/2021   BILITOT 1.1 06/22/2021   Lab Results  Component Value Date   HGBA1C 5.5 06/22/2021   HGBA1C 5.6 03/13/2021   HGBA1C 5.6 04/06/2020   HGBA1C 5.2 12/30/2017   HGBA1C  5.0 02/03/2014   Lab Results  Component Value Date   INSULIN 44.1 (H) 06/22/2021   Lab Results  Component Value Date   TSH 2.200 06/22/2021   Lab Results  Component Value Date   CHOL 247 (H) 06/22/2021   HDL 37 (L) 06/22/2021   LDLCALC 186 (H) 06/22/2021   TRIG 129 06/22/2021   CHOLHDL 7.3 04/06/2020   Lab Results  Component Value Date   VD25OH 24.4 (L) 06/22/2021   VD25OH 29 (L)  02/10/2014   Lab Results  Component Value Date   WBC 10.4 03/13/2021   HGB 14.3 03/13/2021   HCT 42.5 03/13/2021   MCV 90 03/13/2021   PLT 280 03/13/2021   No results found for: IRON, TIBC, FERRITIN  Attestation Statements:   Reviewed by clinician on day of visit: allergies, medications, problem list, medical history, surgical history, family history, social history, and previous encounter notes.  I, Lizbeth Bark, RMA, am acting as Location manager for [employer], DO.   I have reviewed the above documentation for accuracy and completeness, and I agree with the above. Jearld Lesch, DO

## 2021-07-04 ENCOUNTER — Encounter (INDEPENDENT_AMBULATORY_CARE_PROVIDER_SITE_OTHER): Payer: Self-pay | Admitting: Bariatrics

## 2021-07-17 ENCOUNTER — Encounter (INDEPENDENT_AMBULATORY_CARE_PROVIDER_SITE_OTHER): Payer: Self-pay | Admitting: Bariatrics

## 2021-07-17 ENCOUNTER — Ambulatory Visit (INDEPENDENT_AMBULATORY_CARE_PROVIDER_SITE_OTHER): Payer: 59 | Admitting: Bariatrics

## 2021-07-17 ENCOUNTER — Other Ambulatory Visit: Payer: Self-pay

## 2021-07-17 VITALS — BP 140/80 | HR 89 | Temp 98.1°F | Ht 65.0 in | Wt 354.0 lb

## 2021-07-17 DIAGNOSIS — E282 Polycystic ovarian syndrome: Secondary | ICD-10-CM | POA: Diagnosis not present

## 2021-07-17 DIAGNOSIS — E559 Vitamin D deficiency, unspecified: Secondary | ICD-10-CM | POA: Diagnosis not present

## 2021-07-17 DIAGNOSIS — Z6841 Body Mass Index (BMI) 40.0 and over, adult: Secondary | ICD-10-CM | POA: Diagnosis not present

## 2021-07-17 MED ORDER — VITAMIN D (ERGOCALCIFEROL) 1.25 MG (50000 UNIT) PO CAPS: 50000.0000 [IU] | ORAL_CAPSULE | ORAL | 0 refills | Status: DC

## 2021-07-18 NOTE — Progress Notes (Signed)
Chief Complaint:   OBESITY Chloe Pena is here to discuss her progress with her obesity treatment plan along with follow-up of her obesity related diagnoses. Chloe Pena is on the Category 4 Plan and states she is following her eating plan approximately 80% of the time. Chloe Pena states she is doing house chores for 0 minutes 0 times per week.  Today's visit was #: 4 Starting weight: 349 lbs Starting date: 06/15/2021 Today's weight: 354 lbs Today's date: 07/17/2021 Total lbs lost to date: 0 Total lbs lost since last in-office visit: 0  Interim History: Chloe Pena is up 4 lbs since her last visit. She is more stressed (in graduate school). She is not skipping any meals.   Subjective:   1. Vitamin D insufficiency Chloe Pena is taking her medications as directed.   2. PCOS (polycystic ovarian syndrome) Chloe Pena notes Insulin resistance.  Assessment/Plan:   1. Vitamin D insufficiency Low Vitamin D level contributes to fatigue and are associated with obesity, breast, and colon cancer. We will refill prescription Vitamin D 50,000 IU every week for 1 month with no refills and Chloe Pena will follow-up for routine testing of Vitamin D, at least 2-3 times per year to avoid over-replacement.  - Vitamin D, Ergocalciferol, (DRISDOL) 1.25 MG (50000 UNIT) CAPS capsule; Take 1 capsule (50,000 Units total) by mouth every 7 (seven) days.  Dispense: 4 capsule; Refill: 0  2. PCOS (polycystic ovarian syndrome) Intensive lifestyle modifications are first line treatment for this issue. We discussed several lifestyle modifications today and she will continue to work on diet, exercise and weight loss efforts. Chloe Pena will decrease carbohydrates. She will increase protein and healthy fats. Orders and follow up as documented in patient record.  Counseling PCOS is a leading cause of menstrual irregularities and infertility. It is also associated with obesity, hirsutism (excessive hair growth on the face, chest, or back), and  cardiovascular risk factors such as high cholesterol and insulin resistance. Insulin resistance appears to play a central role.  Women with PCOS have been shown to have impaired appetite-regulating hormones. Metformin is one medication that can improve metabolic parameters.  Women with polycystic ovary syndrome (PCOS) have an increased risk for cardiovascular disease (CVD) - European Journal of Preventive Cardiology.   3. Class 3 severe obesity with serious comorbidity and body mass index (BMI) of 50.0 to 59.9 in adult, unspecified obesity type (Chloe Pena) Chloe Pena is currently in the action stage of change. As such, her goal is to continue with weight loss efforts. She has agreed to the Category 3 Plan plus 100 calories and keeping a food journal and adhering to recommended goals of 1500 calories and 80 grams of  protein.   Chloe Pena will continue meal planning. She will adhere closely to the plan.  Exercise goals:  Chloe Pena will start walking.  Behavioral modification strategies: increasing lean protein intake, decreasing simple carbohydrates, increasing vegetables, increasing water intake, decreasing eating out, no skipping meals, meal planning and cooking strategies, keeping healthy foods in the home, and planning for success.  Chloe Pena has agreed to follow-up with our clinic in 2 weeks. She was informed of the importance of frequent follow-up visits to maximize her success with intensive lifestyle modifications for her multiple health conditions.   Objective:   Pulse 89, temperature 98.1 F (36.7 C), height 5\' 5"  (1.651 m), weight (!) 354 lb (160.6 kg), SpO2 97 %. Body mass index is 58.91 kg/m.  General: Cooperative, alert, well developed, in no acute distress. HEENT: Conjunctivae and lids unremarkable. Cardiovascular: Regular  rhythm.  Lungs: Normal work of breathing. Neurologic: No focal deficits.   Lab Results  Component Value Date   CREATININE 0.91 06/22/2021   BUN 13 06/22/2021   NA 140  06/22/2021   K 4.5 06/22/2021   CL 104 06/22/2021   CO2 18 (L) 06/22/2021   Lab Results  Component Value Date   ALT 29 06/22/2021   AST 25 06/22/2021   ALKPHOS 112 06/22/2021   BILITOT 1.1 06/22/2021   Lab Results  Component Value Date   HGBA1C 5.5 06/22/2021   HGBA1C 5.6 03/13/2021   HGBA1C 5.6 04/06/2020   HGBA1C 5.2 12/30/2017   HGBA1C 5.0 02/03/2014   Lab Results  Component Value Date   INSULIN 44.1 (H) 06/22/2021   Lab Results  Component Value Date   TSH 2.200 06/22/2021   Lab Results  Component Value Date   CHOL 247 (H) 06/22/2021   HDL 37 (L) 06/22/2021   LDLCALC 186 (H) 06/22/2021   TRIG 129 06/22/2021   CHOLHDL 7.3 04/06/2020   Lab Results  Component Value Date   VD25OH 24.4 (L) 06/22/2021   VD25OH 29 (L) 02/10/2014   Lab Results  Component Value Date   WBC 10.4 03/13/2021   HGB 14.3 03/13/2021   HCT 42.5 03/13/2021   MCV 90 03/13/2021   PLT 280 03/13/2021   No results found for: IRON, TIBC, FERRITIN  Attestation Statements:   Reviewed by clinician on day of visit: allergies, medications, problem list, medical history, surgical history, family history, social history, and previous encounter notes.  I, Chloe Pena, RMA, am acting as Location manager for [employer], DO.   I have reviewed the above documentation for accuracy and completeness, and I agree with the above. Chloe Lesch, DO

## 2021-07-19 ENCOUNTER — Encounter (INDEPENDENT_AMBULATORY_CARE_PROVIDER_SITE_OTHER): Payer: Self-pay | Admitting: Bariatrics

## 2021-07-31 ENCOUNTER — Other Ambulatory Visit: Payer: Self-pay

## 2021-07-31 ENCOUNTER — Encounter (INDEPENDENT_AMBULATORY_CARE_PROVIDER_SITE_OTHER): Payer: Self-pay | Admitting: Bariatrics

## 2021-07-31 ENCOUNTER — Ambulatory Visit (INDEPENDENT_AMBULATORY_CARE_PROVIDER_SITE_OTHER): Payer: 59 | Admitting: Bariatrics

## 2021-07-31 VITALS — BP 136/82 | HR 106 | Temp 98.3°F | Ht 65.0 in | Wt 357.0 lb

## 2021-07-31 DIAGNOSIS — E8881 Metabolic syndrome: Secondary | ICD-10-CM | POA: Diagnosis not present

## 2021-07-31 DIAGNOSIS — E559 Vitamin D deficiency, unspecified: Secondary | ICD-10-CM

## 2021-07-31 DIAGNOSIS — Z6841 Body Mass Index (BMI) 40.0 and over, adult: Secondary | ICD-10-CM

## 2021-07-31 MED ORDER — METFORMIN HCL 500 MG PO TABS: 500.0000 mg | ORAL_TABLET | Freq: Two times a day (BID) | ORAL | 0 refills | Status: DC

## 2021-07-31 MED ORDER — VITAMIN D (ERGOCALCIFEROL) 1.25 MG (50000 UNIT) PO CAPS: 50000.0000 [IU] | ORAL_CAPSULE | ORAL | 0 refills | Status: DC

## 2021-07-31 NOTE — Progress Notes (Signed)
Chief Complaint:   OBESITY Chloe Pena is here to discuss her progress with her obesity treatment plan along with follow-up of her obesity related diagnoses. Chloe Pena is on the Category 3 Plan and states she is following her eating plan approximately 70% of the time. Chloe Pena states she is walking for 2 hours 1 time per week and other varied exercises  for 30 minutes 3-4 times per week.  Today's visit was #: 5 Starting weight: 349 lbs Starting date: 06/15/2021 Today's weight: 357 lbs Today's date: 07/31/2021 Total lbs lost to date: 0 Total lbs lost since last in-office visit: 0  Interim History: Chloe Pena is up an additional 3 lbs since her last visit. She is not not eating snacks. She is drinking adequate water.   Subjective:   1. Vitamin D insufficiency Chloe Pena is taking her Vitamin D as directed.   2. Insulin resistance Chloe Pena is taking Metformin currently. She notes no side effects.   Assessment/Plan:   1. Vitamin D insufficiency Low Vitamin D level contributes to fatigue and are associated with obesity, breast, and colon cancer. We will refill prescription Vitamin D 50,000 IU every week for 1 month with no refill and Chloe Pena will follow-up for routine testing of Vitamin D, at least 2-3 times per year to avoid over-replacement.  - Vitamin D, Ergocalciferol, (DRISDOL) 1.25 MG (50000 UNIT) CAPS capsule; Take 1 capsule (50,000 Units total) by mouth every 7 (seven) days.  Dispense: 4 capsule; Refill: 0  2. Insulin resistance Chloe Pena will continue Metformin 500 mg twice daily with meals with no refills. She will continue to work on weight loss, exercise, and decreasing simple carbohydrates to help decrease the risk of diabetes. Chloe Pena agreed to follow-up with [place] as directed to closely monitor her progress.  - metFORMIN (GLUCOPHAGE) 500 MG tablet; Take 1 tablet (500 mg total) by mouth 2 (two) times daily with a meal.  Dispense: 60 tablet; Refill: 0  3. Obesity, current BMI 63.4 Chloe Pena is currently in  the action stage of change. As such, her goal is to continue with weight loss efforts. She has agreed to the Category 2 Plan and keeping a food journal and adhering to recommended goals of 1,200 to 1,300  calories and 65-70 grams of  protein.   Chloe Pena will continue meal planning. She will stay with the amount of protein and calories. She will keep her water and protein high.   Exercise goals:  As is.  Behavioral modification strategies: increasing lean protein intake, decreasing simple carbohydrates, increasing vegetables, increasing water intake, decreasing eating out, no skipping meals, meal planning and cooking strategies, keeping healthy foods in the home, and planning for success.  Chloe Pena has agreed to follow-up with our clinic in 2 weeks. She was informed of the importance of frequent follow-up visits to maximize her success with intensive lifestyle modifications for her multiple health conditions.   Objective:   Blood pressure 136/82, pulse (!) 106, temperature 98.3 F (36.8 C), height 5\' 5"  (1.651 m), weight (!) 357 lb (161.9 kg), SpO2 97 %. Body mass index is 59.41 kg/m.  General: Cooperative, alert, well developed, in no acute distress. HEENT: Conjunctivae and lids unremarkable. Cardiovascular: Regular rhythm.  Lungs: Normal work of breathing. Neurologic: No focal deficits.   Lab Results  Component Value Date   CREATININE 0.91 06/22/2021   BUN 13 06/22/2021   NA 140 06/22/2021   K 4.5 06/22/2021   CL 104 06/22/2021   CO2 18 (L) 06/22/2021   Lab Results  Component Value Date   ALT 29 06/22/2021   AST 25 06/22/2021   ALKPHOS 112 06/22/2021   BILITOT 1.1 06/22/2021   Lab Results  Component Value Date   HGBA1C 5.5 06/22/2021   HGBA1C 5.6 03/13/2021   HGBA1C 5.6 04/06/2020   HGBA1C 5.2 12/30/2017   HGBA1C 5.0 02/03/2014   Lab Results  Component Value Date   INSULIN 44.1 (H) 06/22/2021   Lab Results  Component Value Date   TSH 2.200 06/22/2021   Lab Results   Component Value Date   CHOL 247 (H) 06/22/2021   HDL 37 (L) 06/22/2021   LDLCALC 186 (H) 06/22/2021   TRIG 129 06/22/2021   CHOLHDL 7.3 04/06/2020   Lab Results  Component Value Date   VD25OH 24.4 (L) 06/22/2021   VD25OH 29 (L) 02/10/2014   Lab Results  Component Value Date   WBC 10.4 03/13/2021   HGB 14.3 03/13/2021   HCT 42.5 03/13/2021   MCV 90 03/13/2021   PLT 280 03/13/2021   No results found for: IRON, TIBC, FERRITIN  Attestation Statements:   Reviewed by clinician on day of visit: allergies, medications, problem list, medical history, surgical history, family history, social history, and previous encounter notes.  I, Lizbeth Bark, RMA, am acting as Location manager for [employer], DO.   I have reviewed the above documentation for accuracy and completeness, and I agree with the above. Jearld Lesch, DO

## 2021-08-01 ENCOUNTER — Encounter (INDEPENDENT_AMBULATORY_CARE_PROVIDER_SITE_OTHER): Payer: Self-pay | Admitting: Bariatrics

## 2021-08-16 ENCOUNTER — Encounter (INDEPENDENT_AMBULATORY_CARE_PROVIDER_SITE_OTHER): Payer: Self-pay | Admitting: Bariatrics

## 2021-08-16 ENCOUNTER — Other Ambulatory Visit: Payer: Self-pay

## 2021-08-16 ENCOUNTER — Ambulatory Visit (INDEPENDENT_AMBULATORY_CARE_PROVIDER_SITE_OTHER): Payer: 59 | Admitting: Bariatrics

## 2021-08-16 VITALS — BP 128/83 | HR 73 | Temp 99.3°F | Ht 65.0 in | Wt 357.0 lb

## 2021-08-16 DIAGNOSIS — Z6841 Body Mass Index (BMI) 40.0 and over, adult: Secondary | ICD-10-CM

## 2021-08-16 DIAGNOSIS — E559 Vitamin D deficiency, unspecified: Secondary | ICD-10-CM | POA: Diagnosis not present

## 2021-08-16 DIAGNOSIS — E8881 Metabolic syndrome: Secondary | ICD-10-CM

## 2021-08-16 MED ORDER — VITAMIN D (ERGOCALCIFEROL) 1.25 MG (50000 UNIT) PO CAPS: 50000.0000 [IU] | ORAL_CAPSULE | ORAL | 0 refills | Status: DC

## 2021-08-16 MED ORDER — METFORMIN HCL 500 MG PO TABS: 500.0000 mg | ORAL_TABLET | Freq: Two times a day (BID) | ORAL | 0 refills | Status: DC

## 2021-08-21 NOTE — Progress Notes (Signed)
Chief Complaint:   OBESITY Chloe Pena is here to discuss her progress with her obesity treatment plan along with follow-up of her obesity related diagnoses. Chloe Pena is on the Category 3 Plan and states she is following her eating plan approximately 75% of the time. Chloe Pena states she is walking for 2-3 hours 1-3 times per week.  Today's visit was #: 6 Starting weight: 349 lbs Starting date: 06/15/2021 Today's weight: 357 lbs Today's date: 08/16/2021 Total lbs lost to date: 0 Total lbs lost since last in-office visit: 0  Interim History: Chloe Pena's weight remains the same as her previous visit.  Subjective:   1. Vitamin D insufficiency Chloe Pena is currently taking Vitamin D.  2. Insulin resistance Chloe Pena is taking Metformin currently.   Assessment/Plan:   1. Vitamin D insufficiency Low Vitamin D level contributes to fatigue and are associated with obesity, breast, and colon cancer. We will refill prescription Vitamin D 50,000 IU every week for 1 month with no refills and Chloe Pena will follow-up for routine testing of Vitamin D, at least 2-3 times per year to avoid over-replacement.  - Vitamin D, Ergocalciferol, (DRISDOL) 1.25 MG (50000 UNIT) CAPS capsule; Take 1 capsule (50,000 Units total) by mouth every 7 (seven) days.  Dispense: 4 capsule; Refill: 0  2. Insulin resistance Chloe Pena will continue to work on weight loss, exercise, and decreasing simple carbohydrates to help decrease the risk of diabetes. We will refill Metformin 500 mg twice daily for 1 month with no refills. Chloe Pena agreed to follow-up with [place] as directed to closely monitor her progress.  - metFORMIN (GLUCOPHAGE) 500 MG tablet; Take 1 tablet (500 mg total) by mouth 2 (two) times daily with a meal.  Dispense: 60 tablet; Refill: 0  3. Obesity, current BMI 59.4 Chloe Pena is currently in the action stage of change. As such, her goal is to continue with weight loss efforts. She has agreed to following a lower carbohydrate, vegetable and  lean protein rich diet plan.   Chloe Pena will continue meal planning and she will continue intentional eating.   Exercise goals:  As is.  Behavioral modification strategies: increasing lean protein intake, decreasing simple carbohydrates, increasing vegetables, increasing water intake, decreasing eating out, no skipping meals, meal planning and cooking strategies, keeping healthy foods in the home, and planning for success.  Chloe Pena has agreed to follow-up with our clinic in 3 weeks with Chloe Bathe, FNP or Chloe Marble, NP and 6 weeks with myself. She was informed of the importance of frequent follow-up visits to maximize her success with intensive lifestyle modifications for her multiple health conditions.   Objective:   Blood pressure 128/83, pulse 73, temperature 99.3 F (37.4 C), height 5\' 5"  (1.651 m), weight (!) 357 lb (161.9 kg), SpO2 95 %. Body mass index is 59.41 kg/m.  General: Cooperative, alert, well developed, in no acute distress. HEENT: Conjunctivae and lids unremarkable. Cardiovascular: Regular rhythm.  Lungs: Normal work of breathing. Neurologic: No focal deficits.   Lab Results  Component Value Date   CREATININE 0.91 06/22/2021   BUN 13 06/22/2021   NA 140 06/22/2021   K 4.5 06/22/2021   CL 104 06/22/2021   CO2 18 (L) 06/22/2021   Lab Results  Component Value Date   ALT 29 06/22/2021   AST 25 06/22/2021   ALKPHOS 112 06/22/2021   BILITOT 1.1 06/22/2021   Lab Results  Component Value Date   HGBA1C 5.5 06/22/2021   HGBA1C 5.6 03/13/2021   HGBA1C 5.6 04/06/2020  HGBA1C 5.2 12/30/2017   HGBA1C 5.0 02/03/2014   Lab Results  Component Value Date   INSULIN 44.1 (H) 06/22/2021   Lab Results  Component Value Date   TSH 2.200 06/22/2021   Lab Results  Component Value Date   CHOL 247 (H) 06/22/2021   HDL 37 (L) 06/22/2021   LDLCALC 186 (H) 06/22/2021   TRIG 129 06/22/2021   CHOLHDL 7.3 04/06/2020   Lab Results  Component Value Date   VD25OH 24.4  (L) 06/22/2021   VD25OH 29 (L) 02/10/2014   Lab Results  Component Value Date   WBC 10.4 03/13/2021   HGB 14.3 03/13/2021   HCT 42.5 03/13/2021   MCV 90 03/13/2021   PLT 280 03/13/2021   No results found for: IRON, TIBC, FERRITIN  Attestation Statements:   Reviewed by clinician on day of visit: allergies, medications, problem list, medical history, surgical history, family history, social history, and previous encounter notes.  I, Lizbeth Bark, RMA, am acting as Location manager for [employer], DO.   I have reviewed the above documentation for accuracy and completeness, and I agree with the above. Jearld Lesch, DO

## 2021-08-24 ENCOUNTER — Other Ambulatory Visit: Payer: Self-pay | Admitting: Family Medicine

## 2021-08-24 ENCOUNTER — Encounter: Payer: Self-pay | Admitting: Family Medicine

## 2021-08-24 DIAGNOSIS — G43709 Chronic migraine without aura, not intractable, without status migrainosus: Secondary | ICD-10-CM

## 2021-08-24 DIAGNOSIS — F419 Anxiety disorder, unspecified: Secondary | ICD-10-CM

## 2021-08-24 MED ORDER — TOPIRAMATE 25 MG PO TABS: 25.0000 mg | ORAL_TABLET | Freq: Every day | ORAL | 6 refills | Status: DC

## 2021-08-24 MED ORDER — SERTRALINE HCL 50 MG PO TABS: 50.0000 mg | ORAL_TABLET | Freq: Every day | ORAL | 6 refills | Status: DC

## 2021-08-28 ENCOUNTER — Other Ambulatory Visit: Payer: Self-pay

## 2021-08-28 ENCOUNTER — Telehealth (INDEPENDENT_AMBULATORY_CARE_PROVIDER_SITE_OTHER): Payer: Self-pay | Admitting: Psychology

## 2021-08-28 ENCOUNTER — Ambulatory Visit (INDEPENDENT_AMBULATORY_CARE_PROVIDER_SITE_OTHER): Payer: 59 | Admitting: Family Medicine

## 2021-08-28 ENCOUNTER — Encounter (INDEPENDENT_AMBULATORY_CARE_PROVIDER_SITE_OTHER): Payer: Self-pay | Admitting: Family Medicine

## 2021-08-28 VITALS — BP 105/68 | HR 94 | Temp 98.7°F | Ht 65.0 in | Wt 355.0 lb

## 2021-08-28 DIAGNOSIS — Z6841 Body Mass Index (BMI) 40.0 and over, adult: Secondary | ICD-10-CM | POA: Diagnosis not present

## 2021-08-28 DIAGNOSIS — F3289 Other specified depressive episodes: Secondary | ICD-10-CM

## 2021-08-28 DIAGNOSIS — E8881 Metabolic syndrome: Secondary | ICD-10-CM

## 2021-08-28 MED ORDER — METFORMIN HCL 500 MG PO TABS: 500.0000 mg | ORAL_TABLET | Freq: Two times a day (BID) | ORAL | 0 refills | Status: DC

## 2021-08-28 NOTE — Telephone Encounter (Signed)
  Office: [phone]  /  Fax: [phone]  Date of Call: August 28, 2021  Time of Call: 3:48pm Duration of Call: ~4 minute(s) Provider: Glennie Isle, PsyD  CONTENT: Shakeia was referred to this provider by Dr. Jearld Shines during their appointment today. Chart review indicated a history of mental health related concerns and she shared with Dr. Karie Chimera noted in the note for the visit on August 28, 2021], "Cyndal has some significant trauma associated with upbringing and her fathers strict rules about girls and weight." Dr. Jearld Shines also indicated a belief that Yuriko would benefit from a higher level of care versus short-term individual therapeutic services to address only eating-related concerns. Thus, Dr. Jearld Shines referred Ailene Rud to [facility]. To ensure Leea is receiving appropriate care, this provider called Faithann and explained her role with the clinic and discussed Dr. Avie Arenas referral to [facility]. She acknowledged understanding. This provider also explained that in the future, if she is still a patient with the clinic and feels she had the opportunity to process the childhood trauma and would like to focus only on eating-related concerns, she may request to meet with this provider. Tiwana acknowledged understanding. Based on chart review/Rielle's history, a brief risk assessment was completed. Ondria denied experiencing current suicidal, homicidal, and self-harm ideation, plan, or intent.  PLAN:  The initial appointment scheduled with this provider tomorrow will be canceled. No further follow-up planned by this provider.
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
## 2021-08-29 ENCOUNTER — Telehealth (INDEPENDENT_AMBULATORY_CARE_PROVIDER_SITE_OTHER): Payer: 59 | Admitting: Psychology

## 2021-09-05 ENCOUNTER — Encounter (INDEPENDENT_AMBULATORY_CARE_PROVIDER_SITE_OTHER): Payer: Self-pay | Admitting: Bariatrics

## 2021-09-17 ENCOUNTER — Encounter (INDEPENDENT_AMBULATORY_CARE_PROVIDER_SITE_OTHER): Payer: Self-pay | Admitting: Bariatrics

## 2021-09-19 ENCOUNTER — Ambulatory Visit (INDEPENDENT_AMBULATORY_CARE_PROVIDER_SITE_OTHER): Payer: 59 | Admitting: Bariatrics

## 2021-10-04 ENCOUNTER — Encounter (INDEPENDENT_AMBULATORY_CARE_PROVIDER_SITE_OTHER): Payer: Self-pay | Admitting: Physician Assistant

## 2021-10-04 ENCOUNTER — Ambulatory Visit (INDEPENDENT_AMBULATORY_CARE_PROVIDER_SITE_OTHER): Payer: Managed Care, Other (non HMO) | Admitting: Physician Assistant

## 2021-10-04 ENCOUNTER — Other Ambulatory Visit: Payer: Self-pay

## 2021-10-04 VITALS — BP 113/74 | HR 83 | Temp 98.5°F | Ht 65.0 in | Wt 365.0 lb

## 2021-10-04 DIAGNOSIS — E8881 Metabolic syndrome: Secondary | ICD-10-CM

## 2021-10-04 DIAGNOSIS — Z6841 Body Mass Index (BMI) 40.0 and over, adult: Secondary | ICD-10-CM

## 2021-10-04 DIAGNOSIS — E559 Vitamin D deficiency, unspecified: Secondary | ICD-10-CM | POA: Diagnosis not present

## 2021-10-04 MED ORDER — VITAMIN D (ERGOCALCIFEROL) 1.25 MG (50000 UNIT) PO CAPS: 50000.0000 [IU] | ORAL_CAPSULE | ORAL | 0 refills | Status: DC

## 2021-10-04 MED ORDER — METFORMIN HCL 500 MG PO TABS: 500.0000 mg | ORAL_TABLET | Freq: Two times a day (BID) | ORAL | 0 refills | Status: DC

## 2021-10-04 NOTE — Progress Notes (Signed)
Chief Complaint:   OBESITY Chloe Pena is here to discuss her progress with her obesity treatment plan along with follow-up of her obesity related diagnoses. Chloe Pena is on keeping a food journal and adhering to recommended goals of 1700-1800 calories and 110 grams protein and states she is following her eating plan approximately 80% of the time. Chloe Pena states she is not currently exercising.  Today's visit was #: 7 Starting weight: 349 lbs Starting date: 06/15/2021 Today's weight: 365 lbs Today's date: 10/04/2021 Total lbs lost to date: 0 Total lbs lost since last in-office visit: 0  Interim History: She is tearful today due to frustration. She is averaging 1600 calories and 100 grams protein when she journals. Hunger is not an issue. She had PTSD regarding food and does not like to come in and weigh. She is scheduled to meet with a counselor next week.  Subjective:   1. Insulin resistance Pt is on Metformin 500 mg BID. She denies polyphagia.  2. Vitamin D insufficiency Pt is on Rx Vit D weekly and tolerating it well.  Assessment/Plan:   1. Insulin resistance Chloe Pena will continue to work on weight loss, exercise, and decreasing simple carbohydrates to help decrease the risk of diabetes. Chloe Pena agreed to follow-up with [place] as directed to closely monitor her progress.  Refill- metFORMIN (GLUCOPHAGE) 500 MG tablet; Take 1 tablet (500 mg total) by mouth 2 (two) times daily with a meal.  Dispense: 60 tablet; Refill: 0  2. Vitamin D insufficiency Low Vitamin D level contributes to fatigue and are associated with obesity, breast, and colon cancer. She agrees to continue to take prescription Vitamin D 50,000 IU every week and will follow-up for routine testing of Vitamin D, at least 2-3 times per year to avoid over-replacement.  Refill- Vitamin D, Ergocalciferol, (DRISDOL) 1.25 MG (50000 UNIT) CAPS capsule; Take 1 capsule (50,000 Units total) by mouth every 7 (seven) days.  Dispense: 4 capsule;  Refill: 0  3. Morbid Obesity, with current BMI of 60.74  Chloe Pena is currently in the action stage of change. As such, her goal is to continue with weight loss efforts. She has agreed to keeping a food journal and adhering to recommended goals of 1700-1800 calories and 120 grams protein.   Exercise goals: All adults should avoid inactivity. Some physical activity is better than none, and adults who participate in any amount of physical activity gain some health benefits.  Behavioral modification strategies: meal planning and cooking strategies and keeping healthy foods in the home.  Chloe Pena has agreed to follow-up with our clinic in 2-3 weeks. She was informed of the importance of frequent follow-up visits to maximize her success with intensive lifestyle modifications for her multiple health conditions.   Objective:   Blood pressure 113/74, pulse 83, temperature 98.5 F (36.9 C), height 5\' 5"  (1.651 m), weight (!) 365 lb (165.6 kg), SpO2 96 %. Body mass index is 60.74 kg/m.  General: Cooperative, alert, well developed, in no acute distress. HEENT: Conjunctivae and lids unremarkable. Cardiovascular: Regular rhythm.  Lungs: Normal work of breathing. Neurologic: No focal deficits.   Lab Results  Component Value Date   CREATININE 0.91 06/22/2021   BUN 13 06/22/2021   NA 140 06/22/2021   K 4.5 06/22/2021   CL 104 06/22/2021   CO2 18 (L) 06/22/2021   Lab Results  Component Value Date   ALT 29 06/22/2021   AST 25 06/22/2021   ALKPHOS 112 06/22/2021   BILITOT 1.1 06/22/2021   Lab  Results  Component Value Date   HGBA1C 5.5 06/22/2021   HGBA1C 5.6 03/13/2021   HGBA1C 5.6 04/06/2020   HGBA1C 5.2 12/30/2017   HGBA1C 5.0 02/03/2014   Lab Results  Component Value Date   INSULIN 44.1 (H) 06/22/2021   Lab Results  Component Value Date   TSH 2.200 06/22/2021   Lab Results  Component Value Date   CHOL 247 (H) 06/22/2021   HDL 37 (L) 06/22/2021   LDLCALC 186 (H) 06/22/2021    TRIG 129 06/22/2021   CHOLHDL 7.3 04/06/2020   Lab Results  Component Value Date   VD25OH 24.4 (L) 06/22/2021   VD25OH 29 (L) 02/10/2014   Lab Results  Component Value Date   WBC 10.4 03/13/2021   HGB 14.3 03/13/2021   HCT 42.5 03/13/2021   MCV 90 03/13/2021   PLT 280 03/13/2021    Attestation Statements:   Reviewed by clinician on day of visit: allergies, medications, problem list, medical history, surgical history, family history, social history, and previous encounter notes.  Coral Ceo, CMA, am acting as transcriptionist for [employer], PA-C.  I have reviewed the above documentation for accuracy and completeness, and I agree with the above. Abby Potash, PA-C

## 2021-10-09 ENCOUNTER — Ambulatory Visit (INDEPENDENT_AMBULATORY_CARE_PROVIDER_SITE_OTHER): Payer: 59 | Admitting: Psychologist

## 2021-10-09 DIAGNOSIS — F332 Major depressive disorder, recurrent severe without psychotic features: Secondary | ICD-10-CM

## 2021-10-09 DIAGNOSIS — F4310 Post-traumatic stress disorder, unspecified: Secondary | ICD-10-CM | POA: Diagnosis not present

## 2021-10-09 NOTE — Progress Notes (Signed)
                Erikka Follmer, PsyD 
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
## 2021-10-18 ENCOUNTER — Encounter (INDEPENDENT_AMBULATORY_CARE_PROVIDER_SITE_OTHER): Payer: Self-pay | Admitting: Family Medicine

## 2021-10-18 ENCOUNTER — Ambulatory Visit (INDEPENDENT_AMBULATORY_CARE_PROVIDER_SITE_OTHER): Payer: Managed Care, Other (non HMO) | Admitting: Family Medicine

## 2021-10-18 ENCOUNTER — Other Ambulatory Visit: Payer: Self-pay

## 2021-10-18 VITALS — BP 129/82 | HR 99 | Temp 98.7°F | Ht 65.0 in | Wt 361.0 lb

## 2021-10-18 DIAGNOSIS — Z6841 Body Mass Index (BMI) 40.0 and over, adult: Secondary | ICD-10-CM | POA: Diagnosis not present

## 2021-10-18 DIAGNOSIS — R519 Headache, unspecified: Secondary | ICD-10-CM

## 2021-10-18 DIAGNOSIS — E8881 Metabolic syndrome: Secondary | ICD-10-CM | POA: Diagnosis not present

## 2021-10-18 DIAGNOSIS — E669 Obesity, unspecified: Secondary | ICD-10-CM

## 2021-10-18 NOTE — Progress Notes (Signed)
Chief Complaint:   OBESITY Chloe Pena is here to discuss her progress with her obesity treatment plan along with follow-up of her obesity related diagnoses. Chloe Pena is on keeping a food journal and adhering to recommended goals of 1700-1800 calories and 120 grams protein and states she is following her eating plan approximately 70% of the time. Chloe Pena states she is doing lots of cleaning 4-5 minutes 7 times per week.  Today's visit was #: 8 Starting weight: 349 lbs Starting date: 06/15/2021 Today's weight: 361 lbs Today's date: 10/18/2021 Total lbs lost to date: 0 Total lbs lost since last in-office visit: 4  Interim History: Pt has had a very difficult 6 weeks- insurance would not acknowledge her policy and she couldn't get Metformin filled. On best days, she does have to have a Premier Protein shake. On indulgent days, pt isn't going overboard with calories- normally ~2000 calories under. She hasn't had a vehicle to be able to get groceries. Pt is limited due to transportation. Daily average 1587; getting average of 69 gr proteins or 52 or 46.  Subjective:   1. Nonintractable headache, unspecified chronicity pattern, unspecified headache type Pt is taking Topamax with no appreciable difference in carb control and carb intake.  2. Insulin resistance Pt's last A1c was 5.5 with an insulin level of 44.1. She is on Metformin BID.  Assessment/Plan:   1. Nonintractable headache, unspecified chronicity pattern, unspecified headache type Continue Topamax- may want to change to a 90 day Rx at the next fill. Discussed [facility].  2. Insulin resistance Chloe Pena will continue to work on weight loss, exercise, and decreasing simple carbohydrates to help decrease the risk of diabetes. Chloe Pena agreed to follow-up with [place] as directed to closely monitor her progress. Continue Metformin. No refill needed. Repeat labs in March.  3. Obesity with current BMI of 60.1 Chloe Pena is currently in the action  stage of change. As such, her goal is to continue with weight loss efforts. She has agreed to keeping a food journal and adhering to recommended goals of 1700-1800 calories and 120+ grams protein.   Exercise goals:  As is  Behavioral modification strategies: increasing lean protein intake, meal planning and cooking strategies, keeping healthy foods in the home, and planning for success.  Chloe Pena has agreed to follow-up with our clinic in 3 weeks. She was informed of the importance of frequent follow-up visits to maximize her success with intensive lifestyle modifications for her multiple health conditions.   Objective:   Blood pressure 129/82, pulse 99, temperature 98.7 F (37.1 C), height 5\' 5"  (1.651 m), weight (!) 361 lb (163.7 kg), SpO2 95 %. Body mass index is 60.07 kg/m.  General: Cooperative, alert, well developed, in no acute distress. HEENT: Conjunctivae and lids unremarkable. Cardiovascular: Regular rhythm.  Lungs: Normal work of breathing. Neurologic: No focal deficits.   Lab Results  Component Value Date   CREATININE 0.91 06/22/2021   BUN 13 06/22/2021   NA 140 06/22/2021   K 4.5 06/22/2021   CL 104 06/22/2021   CO2 18 (L) 06/22/2021   Lab Results  Component Value Date   ALT 29 06/22/2021   AST 25 06/22/2021   ALKPHOS 112 06/22/2021   BILITOT 1.1 06/22/2021   Lab Results  Component Value Date   HGBA1C 5.5 06/22/2021   HGBA1C 5.6 03/13/2021   HGBA1C 5.6 04/06/2020   HGBA1C 5.2 12/30/2017   HGBA1C 5.0 02/03/2014   Lab Results  Component Value Date   INSULIN 44.1 (  H) 06/22/2021   Lab Results  Component Value Date   TSH 2.200 06/22/2021   Lab Results  Component Value Date   CHOL 247 (H) 06/22/2021   HDL 37 (L) 06/22/2021   LDLCALC 186 (H) 06/22/2021   TRIG 129 06/22/2021   CHOLHDL 7.3 04/06/2020   Lab Results  Component Value Date   VD25OH 24.4 (L) 06/22/2021   VD25OH 29 (L) 02/10/2014   Lab Results  Component Value Date   WBC 10.4 03/13/2021    HGB 14.3 03/13/2021   HCT 42.5 03/13/2021   MCV 90 03/13/2021   PLT 280 03/13/2021    Attestation Statements:   Reviewed by clinician on day of visit: allergies, medications, problem list, medical history, surgical history, family history, social history, and previous encounter notes.  Coral Ceo, CMA, am acting as transcriptionist for Coralie Common, MD.   I have reviewed the above documentation for accuracy and completeness, and I agree with the above. - Coralie Common, MD

## 2021-11-08 ENCOUNTER — Other Ambulatory Visit: Payer: Self-pay

## 2021-11-08 ENCOUNTER — Encounter (INDEPENDENT_AMBULATORY_CARE_PROVIDER_SITE_OTHER): Payer: Self-pay | Admitting: Family Medicine

## 2021-11-08 ENCOUNTER — Ambulatory Visit (INDEPENDENT_AMBULATORY_CARE_PROVIDER_SITE_OTHER): Payer: Managed Care, Other (non HMO) | Admitting: Family Medicine

## 2021-11-08 VITALS — BP 117/73 | HR 86 | Temp 98.6°F | Ht 65.0 in | Wt 365.0 lb

## 2021-11-08 DIAGNOSIS — E669 Obesity, unspecified: Secondary | ICD-10-CM

## 2021-11-08 DIAGNOSIS — E8881 Metabolic syndrome: Secondary | ICD-10-CM

## 2021-11-08 DIAGNOSIS — E7849 Other hyperlipidemia: Secondary | ICD-10-CM | POA: Diagnosis not present

## 2021-11-08 DIAGNOSIS — Z6841 Body Mass Index (BMI) 40.0 and over, adult: Secondary | ICD-10-CM

## 2021-11-08 MED ORDER — METFORMIN HCL 500 MG PO TABS: 500.0000 mg | ORAL_TABLET | Freq: Two times a day (BID) | ORAL | 0 refills | Status: DC

## 2021-11-08 NOTE — Progress Notes (Signed)
Chief Complaint:   OBESITY Chloe Pena is here to discuss her progress with her obesity treatment plan along with follow-up of her obesity related diagnoses. Chloe Pena is on keeping a food journal and adhering to recommended goals of 1700-1800 calories and 120+ grams protein and states she is following her eating plan approximately ?% of the time. Chloe Pena states she is not currently exercising.  Today's visit was #: 9 Starting weight: 349 lbs Starting date: 06/15/2021 Today's weight: 365 lbs Today's date: 11/08/2021 Total lbs lost to date: 0 Total lbs lost since last in-office visit: 0  Interim History: Pt is really struggling with familial stressors. Her father has ESRD and is on dialysis and needs a kidney transplant. She has noticed a significant decline in appetite and has not been able to journal due to familial stress. Pt wants to drop one of her classes due to difficulty keeping up with workload. She has an outlet of baking. She is eating on plan, just not quantity that she knows is on plan. Pt does have self-care options to help de-stress.  Subjective:   1. Insulin resistance Chloe Pena is on Metformin 500 mg PO BID with no GI side effects.  2. Other hyperlipidemia Chloe Pena has an LDL of 186, HDL 37, and triglycerides 129. She is not on meds, although guidelines do suggest meds for LDL over 180.  Assessment/Plan:   1. Insulin resistance Chloe Pena will continue to work on weight loss, exercise, and decreasing simple carbohydrates to help decrease the risk of diabetes. Chloe Pena agreed to follow-up with [place] as directed to closely monitor her progress.  Refill- metFORMIN (GLUCOPHAGE) 500 MG tablet; Take 1 tablet (500 mg total) by mouth 2 (two) times daily with a meal.  Dispense: 180 tablet; Refill: 0  2. Other hyperlipidemia Cardiovascular risk and specific lipid/LDL goals reviewed.  We discussed several lifestyle modifications today and Chloe Pena will continue to work on diet, exercise and weight loss  efforts. Orders and follow up as documented in patient record. Repeat labs in 2 months.  Counseling Intensive lifestyle modifications are the first line treatment for this issue. Dietary changes: Increase soluble fiber. Decrease simple carbohydrates. Exercise changes: Moderate to vigorous-intensity aerobic activity 150 minutes per week if tolerated. Lipid-lowering medications: see documented in medical record.  3. Obesity with current BMI of 60.8 Chloe Pena is currently in the action stage of change. As such, her goal is to continue with weight loss efforts. She has agreed to keeping a food journal and adhering to recommended goals of 1700-1800 calories and 120+ grams protein.   Exercise goals: All adults should avoid inactivity. Some physical activity is better than none, and adults who participate in any amount of physical activity gain some health benefits.  Behavioral modification strategies: increasing lean protein intake, emotional eating strategies, and dealing with family or coworker sabotage.  Chloe Pena has agreed to follow-up with our clinic in 3 weeks. She was informed of the importance of frequent follow-up visits to maximize her success with intensive lifestyle modifications for her multiple health conditions.   Objective:   Blood pressure 117/73, pulse 86, temperature 98.6 F (37 C), height 5\' 5"  (1.651 m), weight (!) 365 lb (165.6 kg), SpO2 97 %. Body mass index is 60.74 kg/m.  General: Cooperative, alert, well developed, in no acute distress. HEENT: Conjunctivae and lids unremarkable. Cardiovascular: Regular rhythm.  Lungs: Normal work of breathing. Neurologic: No focal deficits.   Lab Results  Component Value Date   CREATININE 0.91 06/22/2021   BUN  13 06/22/2021   NA 140 06/22/2021   K 4.5 06/22/2021   CL 104 06/22/2021   CO2 18 (L) 06/22/2021   Lab Results  Component Value Date   ALT 29 06/22/2021   AST 25 06/22/2021   ALKPHOS 112 06/22/2021   BILITOT 1.1  06/22/2021   Lab Results  Component Value Date   HGBA1C 5.5 06/22/2021   HGBA1C 5.6 03/13/2021   HGBA1C 5.6 04/06/2020   HGBA1C 5.2 12/30/2017   HGBA1C 5.0 02/03/2014   Lab Results  Component Value Date   INSULIN 44.1 (H) 06/22/2021   Lab Results  Component Value Date   TSH 2.200 06/22/2021   Lab Results  Component Value Date   CHOL 247 (H) 06/22/2021   HDL 37 (L) 06/22/2021   LDLCALC 186 (H) 06/22/2021   TRIG 129 06/22/2021   CHOLHDL 7.3 04/06/2020   Lab Results  Component Value Date   VD25OH 24.4 (L) 06/22/2021   VD25OH 29 (L) 02/10/2014   Lab Results  Component Value Date   WBC 10.4 03/13/2021   HGB 14.3 03/13/2021   HCT 42.5 03/13/2021   MCV 90 03/13/2021   PLT 280 03/13/2021   Attestation Statements:   Reviewed by clinician on day of visit: allergies, medications, problem list, medical history, surgical history, family history, social history, and previous encounter notes.  Coral Ceo, CMA, am acting as transcriptionist for Coralie Common, MD.   I have reviewed the above documentation for accuracy and completeness, and I agree with the above. - Coralie Common, MD

## 2021-11-27 ENCOUNTER — Other Ambulatory Visit: Payer: Self-pay

## 2021-11-27 ENCOUNTER — Ambulatory Visit: Payer: Self-pay | Attending: Family Medicine | Admitting: Family Medicine

## 2021-11-27 ENCOUNTER — Encounter: Payer: Self-pay | Admitting: Family Medicine

## 2021-11-27 VITALS — BP 149/77 | HR 94 | Ht 65.0 in | Wt 375.0 lb

## 2021-11-27 DIAGNOSIS — D229 Melanocytic nevi, unspecified: Secondary | ICD-10-CM

## 2021-11-27 DIAGNOSIS — Z1159 Encounter for screening for other viral diseases: Secondary | ICD-10-CM

## 2021-11-27 DIAGNOSIS — F439 Reaction to severe stress, unspecified: Secondary | ICD-10-CM

## 2021-11-27 DIAGNOSIS — F419 Anxiety disorder, unspecified: Secondary | ICD-10-CM

## 2021-11-27 DIAGNOSIS — G43709 Chronic migraine without aura, not intractable, without status migrainosus: Secondary | ICD-10-CM

## 2021-11-27 DIAGNOSIS — Z13228 Encounter for screening for other metabolic disorders: Secondary | ICD-10-CM

## 2021-11-27 DIAGNOSIS — F32A Depression, unspecified: Secondary | ICD-10-CM

## 2021-11-27 MED ORDER — SERTRALINE HCL 100 MG PO TABS: 100.0000 mg | ORAL_TABLET | Freq: Every day | ORAL | 1 refills | Status: DC

## 2021-11-27 NOTE — Progress Notes (Signed)
Subjective:  Patient ID: Chloe Pena, female    DOB: 01-18-1996  Age: 26 y.o. MRN: [id number]  CC: Depression   HPI Chloe Pena is a 26 y.o. year old female with a history of anxiety and depression, migraines, acne, PCOS, chronic back pain here for follow-up visit.  Interval History: She is stressed as a result of combining school as well as family stressors.  Her dad is is currently in [place] and diagnosed with end-stage renal disease on hemodialysis which she just found out.  She informs me that he told her that this leads to make her feel guilty and come over to stay with him in [place] but her mother wants her to be here with her in [place].  She breaks down crying. Also stressed from her schoolwork as her professor has high expectations of her which she is finding difficult to meet. She informs me that she would like an increase in dose of her Zoloft which was also recommended by her weight management doctor. Currently not under any form of psychotherapy. She has seen 5 therapist who stated she was too complex and needed to see someone who treats PTSD and trauma.   Her BP is elevated ; she complains of insomnia being responsible for her insomnia. Her migraines are stable on Topamax. She has a mole on her right eyelid which has been present since childhood but has been increasing in size recently and is not itchy or painful.  She would like to be referred to dermatology for evaluation. Past Medical History:  Diagnosis Date   Anxiety    no meds   Asthma    as a child, no inhaler, no problems as adult   Depression    Fatty liver    Migraine    otc med prn   PCOS (polycystic ovarian syndrome)     Past Surgical History:  Procedure Laterality Date   LAPAROSCOPIC OVARIAN CYSTECTOMY N/A 02/11/2018   Procedure: LAPAROSCOPIC OVARIAN CYSTECTOMY;  Surgeon: Sloan Leiter, MD;  Location: [facility];  Service: Gynecology;  Laterality: N/A;   WISDOM TOOTH EXTRACTION       Family History  Problem Relation Age of Onset   Migraines Mother    GER disease Mother    Heart disease Mother    Heart failure Mother    Obesity Mother    Diabetes Father    Obesity Father    Thyroid disease Brother    Breast cancer Maternal Grandmother    Hypertension Maternal Grandmother    Diabetes Maternal Grandmother     Social History   Socioeconomic History   Marital status: Single    Spouse name: Not on file   Number of children: Not on file   Years of education: Not on file   Highest education level: Not on file  Occupational History   Occupation: Student  Tobacco Use   Smoking status: Never   Smokeless tobacco: Never  Vaping Use   Vaping Use: Never used  Substance and Sexual Activity   Alcohol use: No   Drug use: No   Sexual activity: Never    Comment: Nexplanon inserted 12/23/17-[facility]  Other Topics Concern   Not on file  Social History Narrative   Not on file   Social Determinants of Health   Financial Resource Strain: Not on file  Food Insecurity: Not on file  Transportation Needs: Not on file  Physical Activity: Not on file  Stress: Not on file  Social Connections: Not  on file    No Known Allergies  Outpatient Medications Prior to Visit  Medication Sig Dispense Refill   etonogestrel (NEXPLANON) 68 MG IMPL implant 1 each by Subdermal route once.     metFORMIN (GLUCOPHAGE) 500 MG tablet Take 1 tablet (500 mg total) by mouth 2 (two) times daily with a meal. 180 tablet 0   topiramate (TOPAMAX) 25 MG tablet Take 1 tablet (25 mg total) by mouth daily. 30 tablet 6   Vitamin D, Ergocalciferol, (DRISDOL) 1.25 MG (50000 UNIT) CAPS capsule Take 1 capsule (50,000 Units total) by mouth every 7 (seven) days. 4 capsule 0   sertraline (ZOLOFT) 50 MG tablet Take 1 tablet (50 mg total) by mouth daily. 30 tablet 6   No facility-administered medications prior to visit.     ROS Review of Systems  Constitutional:  Negative for activity change, appetite  change and fatigue.  HENT:  Negative for congestion, sinus pressure and sore throat.   Eyes:  Negative for visual disturbance.  Respiratory:  Negative for cough, chest tightness, shortness of breath and wheezing.   Cardiovascular:  Negative for chest pain and palpitations.  Gastrointestinal:  Negative for abdominal distention, abdominal pain and constipation.  Endocrine: Negative for polydipsia.  Genitourinary:  Negative for dysuria and frequency.  Musculoskeletal:  Negative for arthralgias and back pain.  Skin:  Positive for rash.  Neurological:  Negative for tremors, light-headedness and numbness.  Hematological:  Does not bruise/bleed easily.  Psychiatric/Behavioral:  Positive for dysphoric mood. Negative for agitation and behavioral problems.    Objective:  BP (!) 149/77    Pulse 94    Ht '5\' 5"'  (1.651 m)    Wt (!) 375 lb (170.1 kg)    SpO2 97%    BMI 62.40 kg/m   BP/Weight 11/27/2021 05/11/36 0/12/8887  Systolic BP 169 450 388  Diastolic BP 77 73 82  Wt. (Lbs) 375 365 361  BMI 62.4 60.74 60.07  Some encounter information is confidential and restricted. Go to Review Flowsheets activity to see all data.      Physical Exam Constitutional:      Appearance: She is well-developed.  Cardiovascular:     Rate and Rhythm: Normal rate.     Heart sounds: Normal heart sounds. No murmur heard. Pulmonary:     Effort: Pulmonary effort is normal.     Breath sounds: Normal breath sounds. No wheezing or rales.  Chest:     Chest wall: No tenderness.  Abdominal:     General: Bowel sounds are normal. There is no distension.     Palpations: Abdomen is soft. There is no mass.     Tenderness: There is no abdominal tenderness.  Musculoskeletal:        General: Normal range of motion.     Right lower leg: No edema.     Left lower leg: No edema.  Neurological:     Mental Status: She is alert and oriented to person, place, and time.  Psychiatric:        Mood and Affect: Mood normal.    CMP  Latest Ref Rng & Units 06/22/2021 03/13/2021 04/06/2020  Glucose 70 - 99 mg/dL 76 77 96  BUN 6 - 20 mg/dL '13 13 12  ' Creatinine 0.57 - 1.00 mg/dL 0.91 0.95 1.03(H)  Sodium 134 - 144 mmol/L 140 139 140  Potassium 3.5 - 5.2 mmol/L 4.5 4.6 3.7  Chloride 96 - 106 mmol/L 104 101 105  CO2 20 - 29 mmol/L 18(L) 21 23  Calcium 8.7 - 10.2 mg/dL 9.4 10.0 9.1  Total Protein 6.0 - 8.5 g/dL 7.4 7.8 7.0  Total Bilirubin 0.0 - 1.2 mg/dL 1.1 1.3(H) 1.5(H)  Alkaline Phos 44 - 121 IU/L 112 119 92  AST 0 - 40 IU/L 25 30 40  ALT 0 - 32 IU/L 29 52(H) 76(H)    Lipid Panel     Component Value Date/Time   CHOL 247 (H) 06/22/2021 1342   TRIG 129 06/22/2021 1342   HDL 37 (L) 06/22/2021 1342   CHOLHDL 7.3 04/06/2020 0235   VLDL 34 04/06/2020 0235   LDLCALC 186 (H) 06/22/2021 1342    CBC    Component Value Date/Time   WBC 10.4 03/13/2021 1603   WBC 12.0 (H) 02/03/2018 0830   RBC 4.73 03/13/2021 1603   RBC 3.83 (L) 02/03/2018 0830   HGB 14.3 03/13/2021 1603   HCT 42.5 03/13/2021 1603   PLT 280 03/13/2021 1603   MCV 90 03/13/2021 1603   MCH 30.2 03/13/2021 1603   MCH 30.8 02/03/2018 0830   MCHC 33.6 03/13/2021 1603   MCHC 32.1 02/03/2018 0830   RDW 12.2 03/13/2021 1603   LYMPHSABS 3.1 03/13/2021 1603   MONOABS 0.7 03/09/2009 0803   EOSABS 0.2 03/13/2021 1603   BASOSABS 0.1 03/13/2021 1603    Lab Results  Component Value Date   HGBA1C 5.5 06/22/2021    Assessment & Plan:  1. Anxiety and depression Uncontrolled PHQ-9 score is 18 Underlying family and psychosocial stressors contributing Increase dose of Zoloft Referral to psychiatry was recently placed by her weight management physician and she is awaiting a call - sertraline (ZOLOFT) 100 MG tablet; Take 1 tablet (100 mg total) by mouth daily.  Dispense: 90 tablet; Refill: 1  2. Trauma and stressor-related disorder She will benefit from psychotherapy Seen by several therapists in the past with no success She is currently awaiting outcome  of a new referral  3. Chronic migraine without aura without status migrainosus, not intractable Controlled Continue Topamax Not on contraception as she is not sexually active  4. Screening for viral disease - HIV Antibody (routine testing w rflx); Future  5. Fleshy skin mole - Ambulatory referral to Dermatology  6. Screening for metabolic disorder - LP+Non-HDL Cholesterol; Future - CMP14+EGFR; Future - CBC with Differential/Platelet; Future    Meds ordered this encounter  Medications   sertraline (ZOLOFT) 100 MG tablet    Sig: Take 1 tablet (100 mg total) by mouth daily.    Dispense:  90 tablet    Refill:  1    Dose increase    Follow-up: Return in about 3 months (around 02/27/2022) for Chronic medical conditions.       Charlott Rakes, MD, FAAFP. [facility] [place], [place]   11/27/2021, 1:35 PM

## 2021-11-27 NOTE — Progress Notes (Signed)
Mole on face requesting referral ?Discuss increase of depression medication. ?

## 2021-11-29 ENCOUNTER — Other Ambulatory Visit (INDEPENDENT_AMBULATORY_CARE_PROVIDER_SITE_OTHER): Payer: Self-pay | Admitting: Bariatrics

## 2021-11-29 DIAGNOSIS — E559 Vitamin D deficiency, unspecified: Secondary | ICD-10-CM

## 2021-12-04 ENCOUNTER — Encounter (INDEPENDENT_AMBULATORY_CARE_PROVIDER_SITE_OTHER): Payer: Self-pay | Admitting: Family Medicine

## 2021-12-04 ENCOUNTER — Other Ambulatory Visit: Payer: Self-pay

## 2021-12-04 ENCOUNTER — Ambulatory Visit (INDEPENDENT_AMBULATORY_CARE_PROVIDER_SITE_OTHER): Payer: Managed Care, Other (non HMO) | Admitting: Family Medicine

## 2021-12-04 VITALS — BP 123/87 | HR 93 | Temp 98.8°F | Ht 65.0 in | Wt 370.0 lb

## 2021-12-04 DIAGNOSIS — E8881 Metabolic syndrome: Secondary | ICD-10-CM

## 2021-12-04 DIAGNOSIS — Z6841 Body Mass Index (BMI) 40.0 and over, adult: Secondary | ICD-10-CM | POA: Diagnosis not present

## 2021-12-04 DIAGNOSIS — E559 Vitamin D deficiency, unspecified: Secondary | ICD-10-CM | POA: Diagnosis not present

## 2021-12-04 DIAGNOSIS — E669 Obesity, unspecified: Secondary | ICD-10-CM | POA: Diagnosis not present

## 2021-12-04 MED ORDER — VITAMIN D (ERGOCALCIFEROL) 1.25 MG (50000 UNIT) PO CAPS: 50000.0000 [IU] | ORAL_CAPSULE | ORAL | 0 refills | Status: DC

## 2021-12-05 NOTE — Progress Notes (Signed)
? ? ? ?Chief Complaint:  ? ?OBESITY ?Chloe Pena is here to discuss her progress with her obesity treatment plan along with follow-up of her obesity related diagnoses. Chloe Pena is on keeping a food journal and adhering to recommended goals of 1700-1800 calories and 120+ grams of protein daily and states she is following her eating plan approximately 90% of the time. Chloe Pena states she is doing 0 minutes 0 times per week. ? ?Today's visit was #: 10 ?Starting weight: 349 lbs ?Starting date: 06/15/2021 ?Today's weight: 370 lbs ?Today's date: 12/04/2021 ?Total lbs lost to date: 0 ?Total lbs lost since last in-office visit: 0 ? ?Interim History: Chloe Pena is eating lunch more frequently since her last appointment. She is focusing on meal plan and protein content. Recent increase in Zoloft. She did notice improvement in emotional liability. Unfortunately she didn't bring her journal to her appointment today. She is interested in options for water. She is trying to focus on protein, vegetable from scratch. ? ?Subjective:  ? ?1. Vitamin D insufficiency ?Chloe Pena denies nausea, vomiting, or muscle weakness, but notes fatigue. She is on prescription Vitamin D. ? ?2. Insulin resistance ?Chloe Pena's last A1c was 5.5 and insulin 44.1. She is on metformin with no GI side effects. ? ?Assessment/Plan:  ? ?1. Vitamin D insufficiency ?Chloe Pena will continue prescription Vitamin D, and we will refill for 1 month. ? ?- Vitamin D, Ergocalciferol, (DRISDOL) 1.25 MG (50000 UNIT) CAPS capsule; Take 1 capsule (50,000 Units total) by mouth every 7 (seven) days.  Dispense: 4 capsule; Refill: 0 ? ?2. Insulin resistance ?Chloe Pena will continue metformin, no refill needed. ? ?3. Obesity with current BMI of 61.6 ?Chloe Pena is currently in the action stage of change. As such, her goal is to continue with weight loss efforts. She has agreed to keeping a food journal and adhering to recommended goals of 1700-1800 calories and 120+ grams of protein daily.  ? ?Chloe Pena is to start  journaling with protein and calorie content to be able to discuss at her next appointment. We did discuss recipes and nutrition, and how to prepare meals to get closer to 10:1 ratio. ? ?Exercise goals: All adults should avoid inactivity. Some physical activity is better than none, and adults who participate in any amount of physical activity gain some health benefits. ? ?Behavioral modification strategies: increasing lean protein intake, meal planning and cooking strategies, keeping healthy foods in the home, and planning for success. ? ?Chloe Pena has agreed to follow-up with our clinic in 3 weeks. She was informed of the importance of frequent follow-up visits to maximize her success with intensive lifestyle modifications for her multiple health conditions.  ? ?Objective:  ? ?Blood pressure 123/87, pulse 93, temperature 98.8 ?F (37.1 ?C), height '5\' 5"'$  (1.651 m), weight (!) 370 lb (167.8 kg), last menstrual period 10/31/2021, SpO2 96 %. ?Body mass index is 61.57 kg/m?. ? ?General: Cooperative, alert, well developed, in no acute distress. ?HEENT: Conjunctivae and lids unremarkable. ?Cardiovascular: Regular rhythm.  ?Lungs: Normal work of breathing. ?Neurologic: No focal deficits.  ? ?Lab Results  ?Component Value Date  ? CREATININE 0.91 06/22/2021  ? BUN 13 06/22/2021  ? NA 140 06/22/2021  ? K 4.5 06/22/2021  ? CL 104 06/22/2021  ? CO2 18 (L) 06/22/2021  ? ?Lab Results  ?Component Value Date  ? ALT 29 06/22/2021  ? AST 25 06/22/2021  ? ALKPHOS 112 06/22/2021  ? BILITOT 1.1 06/22/2021  ? ?Lab Results  ?Component Value Date  ? HGBA1C 5.5 06/22/2021  ? HGBA1C  5.6 03/13/2021  ? HGBA1C 5.6 04/06/2020  ? HGBA1C 5.2 12/30/2017  ? HGBA1C 5.0 02/03/2014  ? ?Lab Results  ?Component Value Date  ? INSULIN 44.1 (H) 06/22/2021  ? ?Lab Results  ?Component Value Date  ? TSH 2.200 06/22/2021  ? ?Lab Results  ?Component Value Date  ? CHOL 247 (H) 06/22/2021  ? HDL 37 (L) 06/22/2021  ? LDLCALC 186 (H) 06/22/2021  ? TRIG 129 06/22/2021  ?  CHOLHDL 7.3 04/06/2020  ? ?Lab Results  ?Component Value Date  ? VD25OH 24.4 (L) 06/22/2021  ? VD25OH 29 (L) 02/10/2014  ? ?Lab Results  ?Component Value Date  ? WBC 10.4 03/13/2021  ? HGB 14.3 03/13/2021  ? HCT 42.5 03/13/2021  ? MCV 90 03/13/2021  ? PLT 280 03/13/2021  ? ?No results found for: IRON, TIBC, FERRITIN ? ?Attestation Statements:  ? ?Reviewed by clinician on day of visit: allergies, medications, problem list, medical history, surgical history, family history, social history, and previous encounter notes. ? ? ?I, Trixie Dredge, am acting as transcriptionist for Coralie Common, MD. ?I have reviewed the above documentation for accuracy and completeness, and I agree with the above. Coralie Common, MD ? ? ?

## 2021-12-25 ENCOUNTER — Ambulatory Visit (INDEPENDENT_AMBULATORY_CARE_PROVIDER_SITE_OTHER): Payer: Managed Care, Other (non HMO) | Admitting: Family Medicine

## 2021-12-25 ENCOUNTER — Encounter (INDEPENDENT_AMBULATORY_CARE_PROVIDER_SITE_OTHER): Payer: Self-pay | Admitting: Family Medicine

## 2021-12-25 VITALS — BP 139/74 | HR 90 | Temp 98.8°F | Ht 65.0 in | Wt 367.0 lb

## 2021-12-25 DIAGNOSIS — E66813 Obesity, class 3: Secondary | ICD-10-CM

## 2021-12-25 DIAGNOSIS — E8881 Metabolic syndrome: Secondary | ICD-10-CM | POA: Diagnosis not present

## 2021-12-25 DIAGNOSIS — E559 Vitamin D deficiency, unspecified: Secondary | ICD-10-CM

## 2021-12-25 DIAGNOSIS — Z9189 Other specified personal risk factors, not elsewhere classified: Secondary | ICD-10-CM | POA: Diagnosis not present

## 2021-12-25 DIAGNOSIS — E669 Obesity, unspecified: Secondary | ICD-10-CM | POA: Diagnosis not present

## 2021-12-25 DIAGNOSIS — E88819 Insulin resistance, unspecified: Secondary | ICD-10-CM

## 2021-12-25 DIAGNOSIS — Z6841 Body Mass Index (BMI) 40.0 and over, adult: Secondary | ICD-10-CM | POA: Diagnosis not present

## 2021-12-25 MED ORDER — VITAMIN D (ERGOCALCIFEROL) 1.25 MG (50000 UNIT) PO CAPS: 50000.0000 [IU] | ORAL_CAPSULE | ORAL | 0 refills | Status: DC

## 2021-12-26 NOTE — Progress Notes (Signed)
? ? ? ?Chief Complaint:  ? ?OBESITY ?Chloe Pena is here to discuss her progress with her obesity treatment plan along with follow-up of her obesity related diagnoses. Chloe Pena is on keeping a food journal and adhering to recommended goals of 1700-1800 calories and 120+ grams of protein daily and states she is following her eating plan approximately 60% of the time. Chloe Pena states she is walking for 20 minutes 5 times per week. ? ?Today's visit was #: 84 ?Starting weight: 349 lbs  ?Starting date: 06/15/2021 ?Today's weight: 367 lbs ?Today's date: 12/25/2021 ?Total lbs lost to date: 0 ?Total lbs lost since last in-office visit: 3 ? ?Interim History: Chloe Pena is a patient of Dr. Avie Arenas, and this is her first office visit with me. She was approximately 800 calories under budget the past couple of weeks, and getting food has been challenging. She is now drinking 2-3 bottles of water per day.  ? ?Subjective:  ? ?1. Insulin resistance ?Chloe Pena is on metformin BID, and she is tolerating it well as long as she takes with proper food. She has no GI side effects.  ? ?2. Vitamin D insufficiency ?Chloe Pena is tolerating medication(s) well without side effects.  Medication compliance is good as patient endorses taking it as prescribed.  The patient denies additional concerns regarding this condition.     ? ?3. At risk for deficient intake of food ?The patient is at a higher than average risk of deficient intake of food due to inadequate intake. ? ?Assessment/Plan:  ?No orders of the defined types were placed in this encounter. ? ? ?Medications Discontinued During This Encounter  ?Medication Reason  ? Vitamin D, Ergocalciferol, (DRISDOL) 1.25 MG (50000 UNIT) CAPS capsule Reorder  ?  ? ?Meds ordered this encounter  ?Medications  ? Vitamin D, Ergocalciferol, (DRISDOL) 1.25 MG (50000 UNIT) CAPS capsule  ?  Sig: Take 1 capsule (50,000 Units total) by mouth every 7 (seven) days.  ?  Dispense:  4 capsule  ?  Refill:  0  ?  ? ?1. Insulin  resistance ?Chloe Pena will continue metformin, no need for a refill. We will consider rechecking fasting insulin, A1c, B12, and magnesium due to taking metformin if needed in the near future.  ? ?2. Vitamin D insufficiency ?We will refill prescription Vitamin D for 1 month. We will consider rechecking Vit D level in the near future, with her next labs draw. Chloe Pena will follow-up for routine testing of Vitamin D, at least 2-3 times per year to avoid over-replacement. ? ?- Vitamin D, Ergocalciferol, (DRISDOL) 1.25 MG (50000 UNIT) CAPS capsule; Take 1 capsule (50,000 Units total) by mouth every 7 (seven) days.  Dispense: 4 capsule; Refill: 0 ? ?3. At risk for deficient intake of food ?Chloe Pena was given approximately 9 minutes of deficient intake of food prevention counseling today. Chloe Pena is at risk for eating too few calories based on current food recall. She was encouraged to focus on meeting caloric and protein goals according to her recommended meal plan. ? ?4. Obesity with current BMI of 61.1 ?Chloe Pena is currently in the action stage of change. As such, her goal is to continue with weight loss efforts. She has agreed to keeping a food journal and adhering to recommended goals of 1700-1800 calories and 120+ grams of protein daily.  ? ?Exercise goals: As is.  ? ?Behavioral modification strategies: no skipping meals and planning for success. ? ?Chloe Pena has agreed to follow-up with our clinic in 4 weeks. She was informed of the  importance of frequent follow-up visits to maximize her success with intensive lifestyle modifications for her multiple health conditions.  ? ?Objective:  ? ?Blood pressure 139/74, pulse 90, temperature 98.8 ?F (37.1 ?C), height '5\' 5"'$  (1.651 m), weight (!) 367 lb (166.5 kg), SpO2 97 %. ?Body mass index is 61.07 kg/m?. ? ?General: Cooperative, alert, well developed, in no acute distress. ?HEENT: Conjunctivae and lids unremarkable. ?Cardiovascular: Regular rhythm.  ?Lungs: Normal work of  breathing. ?Neurologic: No focal deficits.  ? ?Lab Results  ?Component Value Date  ? CREATININE 0.91 06/22/2021  ? BUN 13 06/22/2021  ? NA 140 06/22/2021  ? K 4.5 06/22/2021  ? CL 104 06/22/2021  ? CO2 18 (L) 06/22/2021  ? ?Lab Results  ?Component Value Date  ? ALT 29 06/22/2021  ? AST 25 06/22/2021  ? ALKPHOS 112 06/22/2021  ? BILITOT 1.1 06/22/2021  ? ?Lab Results  ?Component Value Date  ? HGBA1C 5.5 06/22/2021  ? HGBA1C 5.6 03/13/2021  ? HGBA1C 5.6 04/06/2020  ? HGBA1C 5.2 12/30/2017  ? HGBA1C 5.0 02/03/2014  ? ?Lab Results  ?Component Value Date  ? INSULIN 44.1 (H) 06/22/2021  ? ?Lab Results  ?Component Value Date  ? TSH 2.200 06/22/2021  ? ?Lab Results  ?Component Value Date  ? CHOL 247 (H) 06/22/2021  ? HDL 37 (L) 06/22/2021  ? LDLCALC 186 (H) 06/22/2021  ? TRIG 129 06/22/2021  ? CHOLHDL 7.3 04/06/2020  ? ?Lab Results  ?Component Value Date  ? VD25OH 24.4 (L) 06/22/2021  ? VD25OH 29 (L) 02/10/2014  ? ?Lab Results  ?Component Value Date  ? WBC 10.4 03/13/2021  ? HGB 14.3 03/13/2021  ? HCT 42.5 03/13/2021  ? MCV 90 03/13/2021  ? PLT 280 03/13/2021  ? ?No results found for: IRON, TIBC, FERRITIN ? ?Attestation Statements:  ? ?Reviewed by clinician on day of visit: allergies, medications, problem list, medical history, surgical history, family history, social history, and previous encounter notes. ? ? ?I, Chloe Pena, am acting as transcriptionist for [employer], DO. ? ?I have reviewed the above documentation for accuracy and completeness, and I agree with the above. Chloe Pena, D.O. ? ?The [place] was signed into law in 2016 which includes the topic of electronic health records.  This provides immediate access to information in MyChart.  This includes consultation notes, operative notes, office notes, lab results and pathology reports.  If you have any questions about what you read please let us know at your next visit so we can discuss your concerns and take corrective action if  need be.  We are right here with you. ? ? ?

## 2022-01-24 ENCOUNTER — Encounter (INDEPENDENT_AMBULATORY_CARE_PROVIDER_SITE_OTHER): Payer: Self-pay | Admitting: Family Medicine

## 2022-01-24 ENCOUNTER — Telehealth (INDEPENDENT_AMBULATORY_CARE_PROVIDER_SITE_OTHER): Payer: Commercial Managed Care - HMO | Admitting: Family Medicine

## 2022-01-24 DIAGNOSIS — Z6841 Body Mass Index (BMI) 40.0 and over, adult: Secondary | ICD-10-CM

## 2022-01-24 DIAGNOSIS — E669 Obesity, unspecified: Secondary | ICD-10-CM | POA: Diagnosis not present

## 2022-01-24 DIAGNOSIS — E8881 Metabolic syndrome: Secondary | ICD-10-CM

## 2022-01-24 DIAGNOSIS — E559 Vitamin D deficiency, unspecified: Secondary | ICD-10-CM | POA: Diagnosis not present

## 2022-01-24 MED ORDER — VITAMIN D (ERGOCALCIFEROL) 1.25 MG (50000 UNIT) PO CAPS: 50000.0000 [IU] | ORAL_CAPSULE | ORAL | 0 refills | Status: DC

## 2022-01-31 ENCOUNTER — Other Ambulatory Visit (INDEPENDENT_AMBULATORY_CARE_PROVIDER_SITE_OTHER): Payer: Self-pay | Admitting: Family Medicine

## 2022-01-31 DIAGNOSIS — E8881 Metabolic syndrome: Secondary | ICD-10-CM

## 2022-01-31 NOTE — Progress Notes (Signed)
TeleHealth Visit:  Due to the COVID-19 pandemic, this visit was completed with telemedicine (audio/video) technology to reduce patient and provider exposure as well as to preserve personal protective equipment.   [name] has verbally consented to this TeleHealth visit. The patient is located at home, the provider is located at home. The participants in this visit include the listed provider and patient. The visit was conducted today via Mychart video.  Chief Complaint: OBESITY Chloe Pena is here to discuss her progress with her obesity treatment plan along with follow-up of her obesity related diagnoses. Chloe Pena is on keeping a food journal and adhering to recommended goals of 1700-1800 calories and 120+ grams of protein and states she is following her eating plan approximately 80% of the time. Chloe Pena states she is exercising 0 minutes 0 times per week.  Today's visit was #: 11 Starting weight: 349 lbs Starting date: 06/15/2021  Interim History: Chloe Pena has weeded a garden, Programmer, multimedia and planted strawberries. She planted two separate types. She has no plans for the next few weeks except working out doors. Doing mostly chicken and a bit of beef, and been using a verity of spices and seasonings. Trying to drink 1 or 2 bottles of water a day. She thinks she is at 1100 calories a day.  Subjective:   1. Vitamin D insufficiency Denies any nausea, vomiting or muscle weakness. She notes fatigue. She is currently taking prescription Vit D.  2. Insulin resistance Chloe Pena's last A1c of 5.5, insulin of 44.1, she is currently taking Metformin.  Assessment/Plan:   1. Vitamin D insufficiency We will refill Vit D 50K IU/weekly for 1 month with zero refills. Her Vit D labs are ordered for 5/31.  -Refill Vitamin D, Ergocalciferol, (DRISDOL) 1.25 MG (50000 UNIT) CAPS capsule; Take 1 capsule (50,000 Units total) by mouth every 7 (seven) days.  Dispense: 4 capsule; Refill: 0 - VITAMIN D 25 Hydroxy (Vit-D Deficiency,  Fractures)  2. Insulin resistance A1c and insulin labs ordered for 5/31.  - Hemoglobin A1c - Insulin, random  3. Obesity with current BMI of 61.0 Chloe Pena is currently in the action stage of change. As such, her goal is to continue with weight loss efforts. She has agreed to keeping a food journal and adhering to recommended goals of 1700-1800 calories and 120+ grams of protein daily.   Exercise goals: All adults should avoid inactivity. Some physical activity is better than none, and adults who participate in any amount of physical activity gain some health benefits.  Behavioral modification strategies: increasing lean protein intake, meal planning and cooking strategies, keeping healthy foods in the home, and planning for success.  Chloe Pena has agreed to follow-up with our clinic in 4 weeks. She was informed of the importance of frequent follow-up visits to maximize her success with intensive lifestyle modifications for her multiple health conditions.  Objective:   VITALS: Per patient if applicable, see vitals. GENERAL: Alert and in no acute distress. CARDIOPULMONARY: No increased WOB. Speaking in clear sentences.  PSYCH: Pleasant and cooperative. Speech normal rate and rhythm. Affect is appropriate. Insight and judgement are appropriate. Attention is focused, linear, and appropriate.  NEURO: Oriented as arrived to appointment on time with no prompting.   Lab Results  Component Value Date   CREATININE 0.91 06/22/2021   BUN 13 06/22/2021   NA 140 06/22/2021   K 4.5 06/22/2021   CL 104 06/22/2021   CO2 18 (L) 06/22/2021   Lab Results  Component Value Date   ALT  29 06/22/2021   AST 25 06/22/2021   ALKPHOS 112 06/22/2021   BILITOT 1.1 06/22/2021   Lab Results  Component Value Date   HGBA1C 5.5 06/22/2021   HGBA1C 5.6 03/13/2021   HGBA1C 5.6 04/06/2020   HGBA1C 5.2 12/30/2017   HGBA1C 5.0 02/03/2014   Lab Results  Component Value Date   INSULIN 44.1 (H) 06/22/2021   Lab  Results  Component Value Date   TSH 2.200 06/22/2021   Lab Results  Component Value Date   CHOL 247 (H) 06/22/2021   HDL 37 (L) 06/22/2021   LDLCALC 186 (H) 06/22/2021   TRIG 129 06/22/2021   CHOLHDL 7.3 04/06/2020   Lab Results  Component Value Date   VD25OH 24.4 (L) 06/22/2021   VD25OH 29 (L) 02/10/2014   Lab Results  Component Value Date   WBC 10.4 03/13/2021   HGB 14.3 03/13/2021   HCT 42.5 03/13/2021   MCV 90 03/13/2021   PLT 280 03/13/2021   No results found for: IRON, TIBC, FERRITIN  Attestation Statements:   Reviewed by clinician on day of visit: allergies, medications, problem list, medical history, surgical history, family history, social history, and previous encounter notes.  I, Elnora Morrison, RMA am acting as transcriptionist for Coralie Common, MD.  I have reviewed the above documentation for accuracy and completeness, and I agree with the above. - Coralie Common, MD

## 2022-02-14 ENCOUNTER — Ambulatory Visit: Payer: Self-pay | Attending: Family Medicine

## 2022-02-14 DIAGNOSIS — Z13228 Encounter for screening for other metabolic disorders: Secondary | ICD-10-CM

## 2022-02-14 DIAGNOSIS — Z1159 Encounter for screening for other viral diseases: Secondary | ICD-10-CM

## 2022-02-15 LAB — CBC WITH DIFFERENTIAL/PLATELET
Basophils Absolute: 0.2 10*3/uL (ref 0.0–0.2)
Basos: 1 %
EOS (ABSOLUTE): 0.2 10*3/uL (ref 0.0–0.4)
Eos: 2 %
Hematocrit: 39.8 % (ref 34.0–46.6)
Hemoglobin: 13.6 g/dL (ref 11.1–15.9)
Immature Grans (Abs): 0.1 10*3/uL (ref 0.0–0.1)
Immature Granulocytes: 1 %
Lymphocytes Absolute: 2.9 10*3/uL (ref 0.7–3.1)
Lymphs: 27 %
MCH: 30.2 pg (ref 26.6–33.0)
MCHC: 34.2 g/dL (ref 31.5–35.7)
MCV: 88 fL (ref 79–97)
Monocytes Absolute: 0.7 10*3/uL (ref 0.1–0.9)
Monocytes: 7 %
Neutrophils Absolute: 6.7 10*3/uL (ref 1.4–7.0)
Neutrophils: 62 %
Platelets: 326 10*3/uL (ref 150–450)
RBC: 4.5 x10E6/uL (ref 3.77–5.28)
RDW: 12.3 % (ref 11.7–15.4)
WBC: 10.8 10*3/uL (ref 3.4–10.8)

## 2022-02-15 LAB — LP+NON-HDL CHOLESTEROL
Cholesterol, Total: 235 mg/dL — ABNORMAL HIGH (ref 100–199)
HDL: 38 mg/dL — ABNORMAL LOW (ref >39)
LDL Chol Calc (NIH): 169 mg/dL — ABNORMAL HIGH (ref 0–99)
Total Non-HDL-Chol (LDL+VLDL): 197 mg/dL — ABNORMAL HIGH (ref 0–129)
Triglycerides: 150 mg/dL — ABNORMAL HIGH (ref 0–149)
VLDL Cholesterol Cal: 28 mg/dL (ref 5–40)

## 2022-02-15 LAB — HIV ANTIBODY (ROUTINE TESTING W REFLEX): HIV Screen 4th Generation wRfx: NONREACTIVE

## 2022-02-15 LAB — CMP14+EGFR
ALT: 42 IU/L — ABNORMAL HIGH (ref 0–32)
AST: 30 IU/L (ref 0–40)
Albumin/Globulin Ratio: 1.6 (ref 1.2–2.2)
Albumin: 4.4 g/dL (ref 3.9–5.0)
Alkaline Phosphatase: 116 IU/L (ref 44–121)
BUN/Creatinine Ratio: 14 (ref 9–23)
BUN: 10 mg/dL (ref 6–20)
Bilirubin Total: 0.8 mg/dL (ref 0.0–1.2)
CO2: 21 mmol/L (ref 20–29)
Calcium: 9.4 mg/dL (ref 8.7–10.2)
Chloride: 105 mmol/L (ref 96–106)
Creatinine, Ser: 0.73 mg/dL (ref 0.57–1.00)
Globulin, Total: 2.8 g/dL (ref 1.5–4.5)
Glucose: 82 mg/dL (ref 70–99)
Potassium: 4.4 mmol/L (ref 3.5–5.2)
Sodium: 140 mmol/L (ref 134–144)
Total Protein: 7.2 g/dL (ref 6.0–8.5)
eGFR: 116 mL/min/{1.73_m2} (ref >59)

## 2022-02-21 ENCOUNTER — Ambulatory Visit (INDEPENDENT_AMBULATORY_CARE_PROVIDER_SITE_OTHER): Payer: Commercial Managed Care - HMO | Admitting: Family Medicine

## 2022-02-21 ENCOUNTER — Encounter (INDEPENDENT_AMBULATORY_CARE_PROVIDER_SITE_OTHER): Payer: Self-pay | Admitting: Family Medicine

## 2022-02-21 VITALS — BP 134/83 | HR 98 | Temp 99.0°F | Ht 65.0 in | Wt 380.0 lb

## 2022-02-21 DIAGNOSIS — E8881 Metabolic syndrome: Secondary | ICD-10-CM | POA: Diagnosis not present

## 2022-02-21 DIAGNOSIS — E669 Obesity, unspecified: Secondary | ICD-10-CM

## 2022-02-21 DIAGNOSIS — Z6841 Body Mass Index (BMI) 40.0 and over, adult: Secondary | ICD-10-CM

## 2022-02-21 DIAGNOSIS — Z5941 Food insecurity: Secondary | ICD-10-CM | POA: Diagnosis not present

## 2022-02-21 DIAGNOSIS — E559 Vitamin D deficiency, unspecified: Secondary | ICD-10-CM | POA: Diagnosis not present

## 2022-02-21 DIAGNOSIS — Z7984 Long term (current) use of oral hypoglycemic drugs: Secondary | ICD-10-CM

## 2022-02-21 MED ORDER — VICTOZA 18 MG/3ML ~~LOC~~ SOPN
0.6000 mg | PEN_INJECTOR | Freq: Every day | SUBCUTANEOUS | 0 refills | Status: DC
Start: 2022-02-21 — End: 2022-03-12

## 2022-02-22 MED ORDER — METFORMIN HCL 500 MG PO TABS: 500.0000 mg | ORAL_TABLET | Freq: Two times a day (BID) | ORAL | 0 refills | Status: DC

## 2022-02-23 ENCOUNTER — Other Ambulatory Visit (INDEPENDENT_AMBULATORY_CARE_PROVIDER_SITE_OTHER): Payer: Self-pay | Admitting: Family Medicine

## 2022-02-23 DIAGNOSIS — E559 Vitamin D deficiency, unspecified: Secondary | ICD-10-CM

## 2022-02-23 NOTE — Progress Notes (Unsigned)
Chief Complaint:   OBESITY Chloe Pena is here to discuss her progress with her obesity treatment plan along with follow-up of her obesity related diagnoses. Chloe Pena is on keeping a food journal and adhering to recommended goals of 1700-1800 calories and 120+ grams of protein and states she is following her eating plan approximately 80% of the time. Chloe Pena states she is gardening 60 minutes 70 times per week.  Today's visit was #: 56 Starting weight: 349 lbs Starting date: 06/15/2021 Today's weight: 380 lbs Today's date: 02/21/2022 Total lbs lost to date: 0 lbs Total lbs lost since last in-office visit: 0  Interim History: Akeylah has been trying to focus on lean protein intake. She has decreased food intake to chicken tenderloins and breasts. Eats vegetables when she can get them in. Getting 1500 calories per day and 105 -118 grams of protein. She is taking in supplements protein shake with no hunger. She is trying to get more food in consistently. Her food stamps recently decreased and so money for food is significant decreased from prior amount.   Subjective:   1. Insulin resistance Gatha currently taking Metformin with no improvement.  2. Food insecurity Chloe Pena is dealing with decrease in food stamps and decrease availability to get nutritous foods due to money.  3. Vitamin D deficiency Chloe Pena is currently taking prescription Vit D 50,000 IU once a week. Denies any nausea, vomiting or muscle weakness. She notes fatigue.  Assessment/Plan:   1. Insulin resistance START Victoza 0.6 mg subcutaneous daily. Refill Metformin 500 mg twice a day for 3 months with 0 refills.   -START liraglutide (VICTOZA) 18 MG/3ML SOPN; Inject 0.6 mg into the skin daily.  Dispense: 3 mL; Refill: 0  -Refill metFORMIN (GLUCOPHAGE) 500 MG tablet; Take 1 tablet (500 mg total) by mouth 2 (two) times daily with a meal.  Dispense: 180 tablet; Refill: 0  2. Food insecurity Referral to [facility].  - AMB  Referral to [place] [facility]  3. Vitamin D deficiency We will refill Vit D 50,000 IU once a week for 1 month with 0 refills.  4. Obesity with current BMI of 63.2 Chloe Pena is currently in the action stage of change. As such, her goal is to continue with weight loss efforts. She has agreed to keeping a food journal and adhering to recommended goals of 1700-1800 calories and 120+ grams of protein daily.  Exercise goals: All adults should avoid inactivity. Some physical activity is better than none, and adults who participate in any amount of physical activity gain some health benefits.  Behavioral modification strategies: increasing lean protein intake, meal planning and cooking strategies, planning for success, and keeping a strict food journal.  Chloe Pena has agreed to follow-up with our clinic in 4 weeks. She was informed of the importance of frequent follow-up visits to maximize her success with intensive lifestyle modifications for her multiple health conditions.   Objective:   Blood pressure 134/83, pulse 98, temperature 99 F (37.2 C), height '5\' 5"'$  (1.651 m), weight (!) 380 lb (172.4 kg), SpO2 98 %. Body mass index is 63.24 kg/m.  General: Cooperative, alert, well developed, in no acute distress. HEENT: Conjunctivae and lids unremarkable. Cardiovascular: Regular rhythm.  Lungs: Normal work of breathing. Neurologic: No focal deficits.   Lab Results  Component Value Date   CREATININE 0.73 02/14/2022   BUN 10 02/14/2022   NA 140 02/14/2022   K 4.4 02/14/2022   CL 105 02/14/2022   CO2 21 02/14/2022  Lab Results  Component Value Date   ALT 42 (H) 02/14/2022   AST 30 02/14/2022   ALKPHOS 116 02/14/2022   BILITOT 0.8 02/14/2022   Lab Results  Component Value Date   HGBA1C 5.5 06/22/2021   HGBA1C 5.6 03/13/2021   HGBA1C 5.6 04/06/2020   HGBA1C 5.2 12/30/2017   HGBA1C 5.0 02/03/2014   Lab Results  Component Value Date   INSULIN 44.1 (H) 06/22/2021   Lab Results   Component Value Date   TSH 2.200 06/22/2021   Lab Results  Component Value Date   CHOL 235 (H) 02/14/2022   HDL 38 (L) 02/14/2022   LDLCALC 169 (H) 02/14/2022   TRIG 150 (H) 02/14/2022   CHOLHDL 7.3 04/06/2020   Lab Results  Component Value Date   VD25OH 24.4 (L) 06/22/2021   VD25OH 29 (L) 02/10/2014   Lab Results  Component Value Date   WBC 10.8 02/14/2022   HGB 13.6 02/14/2022   HCT 39.8 02/14/2022   MCV 88 02/14/2022   PLT 326 02/14/2022   No results found for: "IRON", "TIBC", "FERRITIN"  Attestation Statements:   Reviewed by clinician on day of visit: allergies, medications, problem list, medical history, surgical history, family history, social history, and previous encounter notes.  I, Elnora Morrison, RMA am acting as transcriptionist for Coralie Common, MD.  I have reviewed the above documentation for accuracy and completeness, and I agree with the above. -  ***

## 2022-02-27 ENCOUNTER — Other Ambulatory Visit: Payer: Self-pay

## 2022-02-27 ENCOUNTER — Encounter: Payer: Self-pay | Admitting: Family Medicine

## 2022-02-27 ENCOUNTER — Ambulatory Visit: Payer: BC Managed Care – PPO | Attending: Family Medicine | Admitting: Family Medicine

## 2022-02-27 VITALS — BP 127/75 | HR 88 | Temp 99.4°F | Ht 65.0 in | Wt 385.2 lb

## 2022-02-27 DIAGNOSIS — F4310 Post-traumatic stress disorder, unspecified: Secondary | ICD-10-CM | POA: Diagnosis not present

## 2022-02-27 DIAGNOSIS — L600 Ingrowing nail: Secondary | ICD-10-CM

## 2022-02-27 DIAGNOSIS — F419 Anxiety disorder, unspecified: Secondary | ICD-10-CM | POA: Diagnosis not present

## 2022-02-27 DIAGNOSIS — F32A Depression, unspecified: Secondary | ICD-10-CM

## 2022-02-27 DIAGNOSIS — R21 Rash and other nonspecific skin eruption: Secondary | ICD-10-CM

## 2022-02-27 DIAGNOSIS — Z6841 Body Mass Index (BMI) 40.0 and over, adult: Secondary | ICD-10-CM

## 2022-02-27 MED ORDER — TRIAMCINOLONE ACETONIDE 0.1 % EX CREA: 1.0000 "application " | TOPICAL_CREAM | Freq: Two times a day (BID) | CUTANEOUS | 1 refills | Status: AC

## 2022-02-27 NOTE — Progress Notes (Signed)
Has rash around neck. Referral to podiatry.

## 2022-02-27 NOTE — Patient Instructions (Addendum)
[facility] [address], [place], [place] [address] [phone] or [phone] Walk-in urgent care 24/7 for anyone  For [facility] ONLY New patient assessments and therapy walk-ins: Monday and Wednesday 8am-11am First and second Friday 1pm-5pm New patient psychiatry and medication management walk-ins: Mondays, Wednesdays, Thursdays, Fridays 8am-11am No psychiatry walk-ins on Tuesday   *Accepts all insurance and uninsured for Urgent Care needs *Accepts Medicaid and uninsured for outpatient treatment   [facility] (Therapy and psychiatry) [facility] (near [place]) [address] [place], [place] [address] [phone] Fax: [phone] ([place])   [facility] [place],  [place]  [address] [phone] Call for appointment  [facility] of the [place] (Therapy only)  [facility] [address], [place], [place] [address] Monday - Friday: 8:30 a.m.-12 p.m. / 1 p.m.-2:30 p.m.  [facility] [address] [place], [place] [address] Monday-Friday: 8:30 a.m.-12 p.m. / 2-3:30 p.m. (INSURANCE REQUIRED -MEDICAID ACCEPTED) They do offer a sliding fee scale $20-$30/session   [facility] [place], [place] [address] Phone: [place] [address] [place] [address]  Phone: [phone] (Does not accept Medicaid) (only one provider accepts Medicare) [facility] [address] (at [employer] [employer]) [place], [place] [phone] (Accepts Medicaid and Medicare)  [facility]  [place] [address]  [place], [place] [address]  Phone: [phone]  [address] [place], [place] [address] Phone: [phone] [facility] Medicaid) [facility] (Therapy only)  [address] [place], [place] [address] Phone: [phone]   [facility] (Therapy only)  [place], [place] [address] Phone: [phone] [facility])   [place] [address] [place], [place] [address] Phone: [phone] ([place]) [employer] [phone] [address] [place], [place] [address] Phone: [phone] [facility]) [facility] (Psychiatry only)  [phone] [address], [place], [place] [address] (Accepts Medicaid and Medicare) [facility] (Psychiatry and therapy)  [place], [place], [place] [address] [phone] [facility] Medicare) [place] (psychiatry and therapy) [address], [place], [place] [address] [phone]  [facility] [address], [place], [place], [place] [address] [phone] Accept [address] [place], [place], [place], [place],  and the following types of Medicaid; [place], [place], Partners, [place], [place], [employer], Healthy [place], [place] Complete, and [place], as well as offering a Manufacturing systems engineer and private payment options. Provides In-Office Appointments, Virtual Appointments, and Phone Consultations Offers medication management for ages 46 years old and up, including,  Medication Management for Suboxone and [place] [phone] [address], [place], [place] [address] ([place] Medicaid and Medicare)         19.  [facility] (therapy only)  [address] [place], [place] [address]            [phone] (Accepts medicare) 20. [facility]  (Suboxone and methodone) [phone] [address], [place], [place] [address] To Be Eligible for Opioid Treatment at [facility] you must be at least 28 years of age you have already tried other  interventions that were not successful such as opioid  detox, inpatient rehab for opioids, or outpatient counseling specifically for opioid dependency your [facility] drug test must be completely free of benzodiazepines (klonopin, xanax, valium, ativan, or other benz) you have reliable transportation to the [facility] clinic in [place] you recognize that counseling is a critical component of [facility]' Opioid Program and you agree to attend all required counseling sessions you are committed to total drug abstinence and will conscientiously strive to remain free of alcohol, marijuana, and other illicit substances while in treatment you desire a peaceful treatment atmosphere in which personal responsibility and respect toward staff and clients is the norm   21. [facility] [address], [place], [place] [address] Offers SAIOP (Substance Abuse Intensive Outpatient Program) [phone] 22. [facility] [address] [place], [place] [address] [phone] (Accepts medicare)  For those who are tech savvy, go on psychology today, type in your local city (i.e. [place]. [place]) and specify your insurance at the top of the screen after you search. (Medicaid if needed). You can also specify whether you are interested in therapy and psychiatry.  [email or web address].com/us      

## 2022-02-27 NOTE — Progress Notes (Signed)
Subjective:  Patient ID: Chloe Pena, female    DOB: 07-15-1996  Age: 26 y.o. MRN: [id number]  CC: Depression   HPI Chad Tiznado is a 26 y.o. year old female with a history of anxiety and depression, migraines, acne, PCOS, chronic back pain here for follow-up visit.   Interval History: Her last visit with medical weight management was last week.  It does appear that she has been gaining weight.  She has gained 31 pounds in the last 9 months.  Victoza was initiated at that visit. She breaks down in tears as she is sad about the fact that she tries to eat right and is sticking to the dietary plan from the weight management clinic. She is unsure if Victoza has been approved by her insurance company as she is yet to receive it.  She noticed some hyperpigmented rough rash around her neck and was told by her Mom those are signs of diabetes.  She stated initially started in between her breast and she has had them ever since she attained puberty but of recent they are beginning to form a ring around her neck.  Lesions are not pruritic. She does not was having to recurrently dig out her right great toenail and states she has done that since she was a child.  Currently has no erythema, edema or tenderness of her toe. She is still unable to find a psychiatrist told therapist who will accept her as they have informed that she will need to go to [facility] for higher level PTSD is being managed like centers for veterans. Past Medical History:  Diagnosis Date   Anxiety    no meds   Asthma    as a child, no inhaler, no problems as adult   Depression    Fatty liver    Migraine    otc med prn   PCOS (polycystic ovarian syndrome)     Past Surgical History:  Procedure Laterality Date   LAPAROSCOPIC OVARIAN CYSTECTOMY N/A 02/11/2018   Procedure: LAPAROSCOPIC OVARIAN CYSTECTOMY;  Surgeon: Sloan Leiter, MD;  Location: [facility];  Service: Gynecology;  Laterality: N/A;   WISDOM TOOTH  EXTRACTION      Family History  Problem Relation Age of Onset   Migraines Mother    GER disease Mother    Heart disease Mother    Heart failure Mother    Obesity Mother    Diabetes Father    Obesity Father    Thyroid disease Brother    Breast cancer Maternal Grandmother    Hypertension Maternal Grandmother    Diabetes Maternal Grandmother     Social History   Socioeconomic History   Marital status: Single    Spouse name: Not on file   Number of children: Not on file   Years of education: Not on file   Highest education level: Not on file  Occupational History   Occupation: Student  Tobacco Use   Smoking status: Never   Smokeless tobacco: Never  Vaping Use   Vaping Use: Never used  Substance and Sexual Activity   Alcohol use: No   Drug use: No   Sexual activity: Never    Comment: Nexplanon inserted 12/23/17-[place]  Other Topics Concern   Not on file  Social History Narrative   Not on file   Social Determinants of Health   Financial Resource Strain: Not on file  Food Insecurity: Not on file  Transportation Needs: Not on file  Physical Activity: Not  on file  Stress: Not on file  Social Connections: Not on file    No Known Allergies  Outpatient Medications Prior to Visit  Medication Sig Dispense Refill   etonogestrel (NEXPLANON) 68 MG IMPL implant 1 each by Subdermal route once.     metFORMIN (GLUCOPHAGE) 500 MG tablet Take 1 tablet (500 mg total) by mouth 2 (two) times daily with a meal. 180 tablet 0   sertraline (ZOLOFT) 100 MG tablet Take 1 tablet (100 mg total) by mouth daily. 90 tablet 1   topiramate (TOPAMAX) 25 MG tablet Take 1 tablet (25 mg total) by mouth daily. 30 tablet 6   Vitamin D, Ergocalciferol, (DRISDOL) 1.25 MG (50000 UNIT) CAPS capsule Take 1 capsule (50,000 Units total) by mouth every 7 (seven) days. 4 capsule 0   liraglutide (VICTOZA) 18 MG/3ML SOPN Inject 0.6 mg into the skin daily. (Patient not taking: Reported on 02/27/2022) 3 mL 0   No  facility-administered medications prior to visit.     ROS Review of Systems  Constitutional:  Negative for activity change, appetite change and fatigue.  HENT:  Negative for congestion, sinus pressure and sore throat.   Eyes:  Negative for visual disturbance.  Respiratory:  Negative for cough, chest tightness, shortness of breath and wheezing.   Cardiovascular:  Negative for chest pain and palpitations.  Gastrointestinal:  Negative for abdominal distention, abdominal pain and constipation.  Endocrine: Negative for polydipsia.  Genitourinary:  Negative for dysuria and frequency.  Musculoskeletal:  Negative for arthralgias and back pain.  Skin:  Positive for rash.  Neurological:  Negative for tremors, light-headedness and numbness.  Hematological:  Does not bruise/bleed easily.  Psychiatric/Behavioral:  Positive for dysphoric mood. Negative for agitation and behavioral problems.     Objective:  BP 127/75   Pulse 88   Temp 99.4 F (37.4 C) (Oral)   Ht '5\' 5"'$  (1.651 m)   Wt (!) 385 lb 3.2 oz (174.7 kg)   SpO2 97%   BMI 64.10 kg/m      02/27/2022    1:53 PM 02/21/2022   10:00 AM 12/25/2021    9:00 AM  BP/Weight  Systolic BP 332 951 884  Diastolic BP 75 83 74  Wt. (Lbs) 385.2 380 367  BMI 64.1 kg/m2 63.24 kg/m2 61.07 kg/m2      Physical Exam Constitutional:      Appearance: She is well-developed. She is obese.  Cardiovascular:     Rate and Rhythm: Normal rate.     Heart sounds: Normal heart sounds. No murmur heard. Pulmonary:     Effort: Pulmonary effort is normal.     Breath sounds: Normal breath sounds. No wheezing or rales.  Chest:     Chest wall: No tenderness.  Abdominal:     General: Bowel sounds are normal. There is no distension.     Palpations: Abdomen is soft. There is no mass.     Tenderness: There is no abdominal tenderness.  Musculoskeletal:        General: Normal range of motion.     Right lower leg: No edema.     Left lower leg: No edema.  Skin:     Comments: Rough hyperpigmented plaques in circumferential distribution around the neck and also in between both breast  Neurological:     Mental Status: She is alert and oriented to person, place, and time.  Psychiatric:     Comments: Normal mood        Latest Ref Rng & Units 02/14/2022  2:53 PM 06/22/2021    1:42 PM 03/13/2021    4:03 PM  CMP  Glucose 70 - 99 mg/dL 82  76  77   BUN 6 - 20 mg/dL '10  13  13   '$ Creatinine 0.57 - 1.00 mg/dL 0.73  0.91  0.95   Sodium 134 - 144 mmol/L 140  140  139   Potassium 3.5 - 5.2 mmol/L 4.4  4.5  4.6   Chloride 96 - 106 mmol/L 105  104  101   CO2 20 - 29 mmol/L '21  18  21   '$ Calcium 8.7 - 10.2 mg/dL 9.4  9.4  10.0   Total Protein 6.0 - 8.5 g/dL 7.2  7.4  7.8   Total Bilirubin 0.0 - 1.2 mg/dL 0.8  1.1  1.3   Alkaline Phos 44 - 121 IU/L 116  112  119   AST 0 - 40 IU/L '30  25  30   '$ ALT 0 - 32 IU/L 42  29  52     Lipid Panel     Component Value Date/Time   CHOL 235 (H) 02/14/2022 1453   TRIG 150 (H) 02/14/2022 1453   HDL 38 (L) 02/14/2022 1453   CHOLHDL 7.3 04/06/2020 0235   VLDL 34 04/06/2020 0235   LDLCALC 169 (H) 02/14/2022 1453    CBC    Component Value Date/Time   WBC 10.8 02/14/2022 1453   WBC 12.0 (H) 02/03/2018 0830   RBC 4.50 02/14/2022 1453   RBC 3.83 (L) 02/03/2018 0830   HGB 13.6 02/14/2022 1453   HCT 39.8 02/14/2022 1453   PLT 326 02/14/2022 1453   MCV 88 02/14/2022 1453   MCH 30.2 02/14/2022 1453   MCH 30.8 02/03/2018 0830   MCHC 34.2 02/14/2022 1453   MCHC 32.1 02/03/2018 0830   RDW 12.3 02/14/2022 1453   LYMPHSABS 2.9 02/14/2022 1453   MONOABS 0.7 03/09/2009 0803   EOSABS 0.2 02/14/2022 1453   BASOSABS 0.2 02/14/2022 1453    Lab Results  Component Value Date   HGBA1C 5.5 06/22/2021    Assessment & Plan:  1. Anxiety and depression Dose of Zoloft was increased at her last visit She does have underlying PTSD and will benefit from seeing a psychiatrist Per patient she was declined by 7 behavioral health  practices I have provided her with a list of resources to contact for psychotherapy  2. Rash Dermatitis of unknown etiology She has an upcoming appointment with dermatology We will place on topical steroid - triamcinolone cream (KENALOG) 0.1 %; Apply 1 application  topically 2 (two) times daily.  Dispense: 45 g; Refill: 1  3. Ingrown nail of great toe No evidence of infection today She has been performing foot care at home If this occurs again consider referring to podiatry  4. Morbid obesity ([place]) Unfortunately she has gained 31 pounds which is worsening her depression She is unsure if Victoza has been approved by her insurance company as she is yet to start this I have checked with the pharmacist regarding Mancel Parsons which has a greater weight loss benefit but found that this is on backorder  5. PTSD (post-traumatic stress disorder) She does have a history of multiple underlying childhood trauma and will benefit from psychotherapy Provided with a list of psychotherapy resources    Meds ordered this encounter  Medications   triamcinolone cream (KENALOG) 0.1 %    Sig: Apply 1 application  topically 2 (two) times daily.    Dispense:  45  g    Refill:  1    Follow-up: Return in about 6 months (around 08/29/2022) for Chronic medical conditions.       Charlott Rakes, MD, FAAFP. [facility] [place], [place]   02/27/2022, 2:26 PM

## 2022-03-07 ENCOUNTER — Encounter (INDEPENDENT_AMBULATORY_CARE_PROVIDER_SITE_OTHER): Payer: Self-pay | Admitting: Family Medicine

## 2022-03-08 NOTE — Telephone Encounter (Signed)
FYI Victoza denied but insurance gave pt options.

## 2022-03-08 NOTE — Telephone Encounter (Signed)
FYI

## 2022-03-12 ENCOUNTER — Other Ambulatory Visit (INDEPENDENT_AMBULATORY_CARE_PROVIDER_SITE_OTHER): Payer: Self-pay | Admitting: Family Medicine

## 2022-03-12 DIAGNOSIS — E8881 Metabolic syndrome: Secondary | ICD-10-CM

## 2022-03-12 MED ORDER — TRULICITY 0.75 MG/0.5ML ~~LOC~~ SOAJ: 0.7500 mg | SUBCUTANEOUS | 0 refills | Status: DC

## 2022-03-19 ENCOUNTER — Encounter (INDEPENDENT_AMBULATORY_CARE_PROVIDER_SITE_OTHER): Payer: Self-pay | Admitting: Family Medicine

## 2022-03-19 ENCOUNTER — Encounter (INDEPENDENT_AMBULATORY_CARE_PROVIDER_SITE_OTHER): Payer: Self-pay

## 2022-03-19 ENCOUNTER — Ambulatory Visit (INDEPENDENT_AMBULATORY_CARE_PROVIDER_SITE_OTHER): Payer: Commercial Managed Care - HMO | Admitting: Family Medicine

## 2022-03-19 ENCOUNTER — Telehealth (INDEPENDENT_AMBULATORY_CARE_PROVIDER_SITE_OTHER): Payer: Self-pay | Admitting: Family Medicine

## 2022-03-19 VITALS — BP 100/68 | HR 108 | Temp 98.9°F | Ht 65.0 in | Wt 383.0 lb

## 2022-03-19 DIAGNOSIS — E785 Hyperlipidemia, unspecified: Secondary | ICD-10-CM

## 2022-03-19 DIAGNOSIS — E559 Vitamin D deficiency, unspecified: Secondary | ICD-10-CM

## 2022-03-19 DIAGNOSIS — E669 Obesity, unspecified: Secondary | ICD-10-CM

## 2022-03-19 DIAGNOSIS — E8881 Metabolic syndrome: Secondary | ICD-10-CM | POA: Diagnosis not present

## 2022-03-19 DIAGNOSIS — Z6841 Body Mass Index (BMI) 40.0 and over, adult: Secondary | ICD-10-CM

## 2022-03-19 MED ORDER — TRULICITY 1.5 MG/0.5ML ~~LOC~~ SOAJ: 1.5000 mg | SUBCUTANEOUS | 0 refills | Status: DC

## 2022-03-19 MED ORDER — VITAMIN D (ERGOCALCIFEROL) 1.25 MG (50000 UNIT) PO CAPS: 50000.0000 [IU] | ORAL_CAPSULE | ORAL | 0 refills | Status: DC

## 2022-03-19 NOTE — Telephone Encounter (Signed)
Dr. Jearld Shines - Prior authorization not required for Trulicity per insurance. Patient sent message via mychart.

## 2022-03-21 NOTE — Progress Notes (Signed)
Chief Complaint:   OBESITY Chloe Pena is here to discuss her progress with her obesity treatment plan along with follow-up of her obesity related diagnoses. Chloe Pena is on keeping a food journal and adhering to recommended goals of 1700-1800 calories and 120+ grams of protein and states she is following her eating plan approximately 75% of the time. Chloe Pena states she is gardening 20 minutes 7 times per week.  Today's visit was #: 14 Starting weight: 349 lbs Starting date: 06/15/2021 Today's weight: 383 lbs Today's date: 03/19/2022 Total lbs lost to date: 0 lbs Total lbs lost since last in-office visit: 0  Interim History: Chloe Pena has been monitoring calories and protein daily. Ending up with 1600 calories daily. She is trying to cut back on protein shakes. More indulgent days like if she eats fried chicken or undone noodles, she does eat 1800 calories. She does not think she has had any side effects of Trulicity. She is going off food recall for food diary. She is doing quite a bit of gardening in her yard.   Subjective:   1. Insulin resistance Latrise is currently on Trulicity 6.76 mg weekly. Denies GI side effects noted.  2. Dyslipidemia Adriona is journaling (mental food recall). Her last LDL of 169, HDL of 38, and Trigly 150.  3. Vitamin D insufficiency Chloe Pena is currently taking prescription Vit D 50,000 IU once a week. Denies any nausea, vomiting or muscle weakness. She notes fatigue.  Assessment/Plan:   1. Insulin resistance We will Refill and Increase Trulicity to 1.5 mg SubQ once weekly.  -Refill/Increase Dulaglutide (TRULICITY) 1.5 PP/5.0DT SOPN; Inject 1.5 mg into the skin once a week.  Dispense: 2 mL; Refill: 0  2. Dyslipidemia Tranise will follow up labs in Sept 2023.  3. Vitamin D insufficiency We will refill Vit D 50,000 IU once a week for 1 month with 0 refills.  -Refill Vitamin D, Ergocalciferol, (DRISDOL) 1.25 MG (50000 UNIT) CAPS capsule; Take 1 capsule (50,000 Units  total) by mouth every 7 (seven) days.  Dispense: 4 capsule; Refill: 0  4. Obesity with current BMI of 63.9 Chloe Pena is currently in the action stage of change. As such, her goal is to continue with weight loss efforts. She has agreed to keeping a food journal and adhering to recommended goals of 1700-1800 calories and 120+ grams of protein daily.  Exercise goals: All adults should avoid inactivity. Some physical activity is better than none, and adults who participate in any amount of physical activity gain some health benefits.  Behavioral modification strategies: increasing lean protein intake, meal planning and cooking strategies, keeping healthy foods in the home, and keeping a strict food journal.  Chloe Pena has agreed to follow-up with our clinic in 4 weeks. She was informed of the importance of frequent follow-up visits to maximize her success with intensive lifestyle modifications for her multiple health conditions.   Objective:   Blood pressure 100/68, pulse (!) 108, temperature 98.9 F (37.2 C), height '5\' 5"'$  (1.651 m), weight (!) 383 lb (173.7 kg), SpO2 97 %. Body mass index is 63.73 kg/m.  General: Cooperative, alert, well developed, in no acute distress. HEENT: Conjunctivae and lids unremarkable. Cardiovascular: Regular rhythm.  Lungs: Normal work of breathing. Neurologic: No focal deficits.   Lab Results  Component Value Date   CREATININE 0.73 02/14/2022   BUN 10 02/14/2022   NA 140 02/14/2022   K 4.4 02/14/2022   CL 105 02/14/2022   CO2 21 02/14/2022   Lab Results  Component Value Date   ALT 42 (H) 02/14/2022   AST 30 02/14/2022   ALKPHOS 116 02/14/2022   BILITOT 0.8 02/14/2022   Lab Results  Component Value Date   HGBA1C 5.5 06/22/2021   HGBA1C 5.6 03/13/2021   HGBA1C 5.6 04/06/2020   HGBA1C 5.2 12/30/2017   HGBA1C 5.0 02/03/2014   Lab Results  Component Value Date   INSULIN 44.1 (H) 06/22/2021   Lab Results  Component Value Date   TSH 2.200 06/22/2021    Lab Results  Component Value Date   CHOL 235 (H) 02/14/2022   HDL 38 (L) 02/14/2022   LDLCALC 169 (H) 02/14/2022   TRIG 150 (H) 02/14/2022   CHOLHDL 7.3 04/06/2020   Lab Results  Component Value Date   VD25OH 24.4 (L) 06/22/2021   VD25OH 29 (L) 02/10/2014   Lab Results  Component Value Date   WBC 10.8 02/14/2022   HGB 13.6 02/14/2022   HCT 39.8 02/14/2022   MCV 88 02/14/2022   PLT 326 02/14/2022   No results found for: "IRON", "TIBC", "FERRITIN"  Attestation Statements:   Reviewed by clinician on day of visit: allergies, medications, problem list, medical history, surgical history, family history, social history, and previous encounter notes.  I, Elnora Morrison, RMA am acting as transcriptionist for Coralie Common, MD.  I have reviewed the above documentation for accuracy and completeness, and I agree with the above. - Coralie Common, MD

## 2022-03-27 ENCOUNTER — Telehealth (INDEPENDENT_AMBULATORY_CARE_PROVIDER_SITE_OTHER): Payer: Self-pay | Admitting: Family Medicine

## 2022-03-27 ENCOUNTER — Encounter (INDEPENDENT_AMBULATORY_CARE_PROVIDER_SITE_OTHER): Payer: Self-pay

## 2022-03-27 NOTE — Telephone Encounter (Signed)
Dr. Jearld Shines - Prior authorization approved for Trulicity 1.5 MG. Effective: 03/26/2022 - 03/26/2023. Patient sent approval message via mychart.

## 2022-04-23 ENCOUNTER — Ambulatory Visit (INDEPENDENT_AMBULATORY_CARE_PROVIDER_SITE_OTHER): Payer: Commercial Managed Care - HMO | Admitting: Family Medicine

## 2022-04-25 ENCOUNTER — Encounter (INDEPENDENT_AMBULATORY_CARE_PROVIDER_SITE_OTHER): Payer: Self-pay

## 2022-04-26 ENCOUNTER — Encounter (INDEPENDENT_AMBULATORY_CARE_PROVIDER_SITE_OTHER): Payer: Self-pay | Admitting: Family Medicine

## 2022-04-26 ENCOUNTER — Ambulatory Visit (INDEPENDENT_AMBULATORY_CARE_PROVIDER_SITE_OTHER): Payer: Commercial Managed Care - HMO | Admitting: Family Medicine

## 2022-04-26 VITALS — BP 111/61 | HR 103 | Temp 98.4°F | Ht 65.0 in | Wt 385.0 lb

## 2022-04-26 DIAGNOSIS — E8881 Metabolic syndrome: Secondary | ICD-10-CM

## 2022-04-26 DIAGNOSIS — E669 Obesity, unspecified: Secondary | ICD-10-CM

## 2022-04-26 DIAGNOSIS — Z6841 Body Mass Index (BMI) 40.0 and over, adult: Secondary | ICD-10-CM | POA: Diagnosis not present

## 2022-04-26 DIAGNOSIS — E559 Vitamin D deficiency, unspecified: Secondary | ICD-10-CM

## 2022-04-26 DIAGNOSIS — Z7985 Long-term (current) use of injectable non-insulin antidiabetic drugs: Secondary | ICD-10-CM

## 2022-04-26 MED ORDER — TRULICITY 1.5 MG/0.5ML ~~LOC~~ SOAJ: 1.5000 mg | SUBCUTANEOUS | 0 refills | Status: DC

## 2022-05-03 ENCOUNTER — Other Ambulatory Visit (INDEPENDENT_AMBULATORY_CARE_PROVIDER_SITE_OTHER): Payer: Self-pay | Admitting: Family Medicine

## 2022-05-03 DIAGNOSIS — E559 Vitamin D deficiency, unspecified: Secondary | ICD-10-CM

## 2022-05-03 NOTE — Progress Notes (Signed)
Chief Complaint:   OBESITY Chloe Pena is here to discuss her progress with her obesity treatment plan along with follow-up of her obesity related diagnoses. Chloe Pena is on keeping a food journal and adhering to recommended goals of 1700-1800 calories and 120+ grams of protein and states she is following her eating plan approximately 0% of the time. Chloe Pena states she is mowing lawn/gardening 45 minutes 2-7 times per week.  Today's visit was #: 15 Starting weight: 349 lbs Starting date: 06/15/2021 Today's weight: 385 lbs Today's date: 04/26/2022 Total lbs lost to date: 0 Total lbs lost since last in-office visit: 0  Interim History: Chloe Pena has had a difficult day last week on her birthday. She realizes on Trulicity she can not get a heavy meal in during mourning or she will be nauseous. Financially she has been limited in options due to a Arch[employer]. She is fluctuating between 60-90 grams of protein daily. She has tried to be mindful of food choices. Looking for another part time job.  Subjective:   1. Vitamin D deficiency Chloe Pena is currently taking prescription Vit D 50,000 IU once a week. Denies any nausea, vomiting or muscle weakness. She notes fatigue. Her last Vit D level from Oct 2022.  2. Insulin resistance Chloe Pena is currently on Trulicity 1.5 mg. Her last A1c was 5.5 but insulin was 44.1. Denies GI side effects.  Assessment/Plan:   1. Vitamin D deficiency We will obtain Vit D level at next appointment.  2. Insulin resistance We will refill Trulicity 56.OZ SubQ once weekly for 1 month with 0 refills.  -Refill Dulaglutide (TRULICITY) 1.5 HY/8.6VH SOPN; Inject 1.5 mg into the skin once a week.  Dispense: 2 mL; Refill: 0  3. Obesity with current BMI of 64.1 Chloe Pena is currently in the action stage of change. As such, her goal is to continue with weight loss efforts. She has agreed to keeping a food journal and adhering to recommended goals of 1700-1800 calories and 120+ grams of protein and  practicing portion control and making smarter food choices, such as increasing vegetables and decreasing simple carbohydrates.   Exercise goals: All adults should avoid inactivity. Some physical activity is better than none, and adults who participate in any amount of physical activity gain some health benefits.  Behavioral modification strategies: increasing lean protein intake, meal planning and cooking strategies, keeping healthy foods in the home, and planning for success.  Chloe Pena has agreed to follow-up with our clinic in 4 weeks. She was informed of the importance of frequent follow-up visits to maximize her success with intensive lifestyle modifications for her multiple health conditions.   Objective:   Blood pressure 111/61, pulse (!) 103, temperature 98.4 F (36.9 C), height '5\' 5"'$  (1.651 m), weight (!) 385 lb (174.6 kg), SpO2 96 %. Body mass index is 64.07 kg/m.  General: Cooperative, alert, well developed, in no acute distress. HEENT: Conjunctivae and lids unremarkable. Cardiovascular: Regular rhythm.  Lungs: Normal work of breathing. Neurologic: No focal deficits.   Lab Results  Component Value Date   CREATININE 0.73 02/14/2022   BUN 10 02/14/2022   NA 140 02/14/2022   K 4.4 02/14/2022   CL 105 02/14/2022   CO2 21 02/14/2022   Lab Results  Component Value Date   ALT 42 (H) 02/14/2022   AST 30 02/14/2022   ALKPHOS 116 02/14/2022   BILITOT 0.8 02/14/2022   Lab Results  Component Value Date   HGBA1C 5.5 06/22/2021   HGBA1C 5.6 03/13/2021  HGBA1C 5.6 04/06/2020   HGBA1C 5.2 12/30/2017   HGBA1C 5.0 02/03/2014   Lab Results  Component Value Date   INSULIN 44.1 (H) 06/22/2021   Lab Results  Component Value Date   TSH 2.200 06/22/2021   Lab Results  Component Value Date   CHOL 235 (H) 02/14/2022   HDL 38 (L) 02/14/2022   LDLCALC 169 (H) 02/14/2022   TRIG 150 (H) 02/14/2022   CHOLHDL 7.3 04/06/2020   Lab Results  Component Value Date   VD25OH 24.4 (L)  06/22/2021   VD25OH 29 (L) 02/10/2014   Lab Results  Component Value Date   WBC 10.8 02/14/2022   HGB 13.6 02/14/2022   HCT 39.8 02/14/2022   MCV 88 02/14/2022   PLT 326 02/14/2022   No results found for: "IRON", "TIBC", "FERRITIN"  Attestation Statements:   Reviewed by clinician on day of visit: allergies, medications, problem list, medical history, surgical history, family history, social history, and previous encounter notes.  I, Elnora Morrison, RMA am acting as transcriptionist for Coralie Common, MD. I have reviewed the above documentation for accuracy and completeness, and I agree with the above. - Coralie Common, MD

## 2022-05-28 ENCOUNTER — Ambulatory Visit (INDEPENDENT_AMBULATORY_CARE_PROVIDER_SITE_OTHER): Payer: Commercial Managed Care - HMO | Admitting: Family Medicine

## 2022-05-28 ENCOUNTER — Encounter (INDEPENDENT_AMBULATORY_CARE_PROVIDER_SITE_OTHER): Payer: Self-pay | Admitting: Family Medicine

## 2022-05-28 VITALS — BP 137/90 | HR 84 | Temp 98.8°F | Ht 65.0 in | Wt 383.0 lb

## 2022-05-28 DIAGNOSIS — E785 Hyperlipidemia, unspecified: Secondary | ICD-10-CM

## 2022-05-28 DIAGNOSIS — R7401 Elevation of levels of liver transaminase levels: Secondary | ICD-10-CM

## 2022-05-28 DIAGNOSIS — E8881 Metabolic syndrome: Secondary | ICD-10-CM | POA: Diagnosis not present

## 2022-05-28 DIAGNOSIS — Z6841 Body Mass Index (BMI) 40.0 and over, adult: Secondary | ICD-10-CM

## 2022-05-28 DIAGNOSIS — E559 Vitamin D deficiency, unspecified: Secondary | ICD-10-CM

## 2022-05-28 DIAGNOSIS — E88819 Insulin resistance, unspecified: Secondary | ICD-10-CM

## 2022-05-28 DIAGNOSIS — E669 Obesity, unspecified: Secondary | ICD-10-CM

## 2022-05-29 LAB — COMPREHENSIVE METABOLIC PANEL
ALT: 34 IU/L — ABNORMAL HIGH (ref 0–32)
AST: 24 IU/L (ref 0–40)
Albumin/Globulin Ratio: 1.8 (ref 1.2–2.2)
Albumin: 4.6 g/dL (ref 4.0–5.0)
Alkaline Phosphatase: 122 IU/L — ABNORMAL HIGH (ref 44–121)
BUN/Creatinine Ratio: 13 (ref 9–23)
BUN: 10 mg/dL (ref 6–20)
Bilirubin Total: 0.9 mg/dL (ref 0.0–1.2)
CO2: 22 mmol/L (ref 20–29)
Calcium: 9.2 mg/dL (ref 8.7–10.2)
Chloride: 102 mmol/L (ref 96–106)
Creatinine, Ser: 0.8 mg/dL (ref 0.57–1.00)
Globulin, Total: 2.6 g/dL (ref 1.5–4.5)
Glucose: 78 mg/dL (ref 70–99)
Potassium: 4.7 mmol/L (ref 3.5–5.2)
Sodium: 138 mmol/L (ref 134–144)
Total Protein: 7.2 g/dL (ref 6.0–8.5)
eGFR: 103 mL/min/{1.73_m2} (ref >59)

## 2022-05-29 LAB — LIPID PANEL WITH LDL/HDL RATIO
Cholesterol, Total: 240 mg/dL — ABNORMAL HIGH (ref 100–199)
HDL: 38 mg/dL — ABNORMAL LOW (ref >39)
LDL Chol Calc (NIH): 181 mg/dL — ABNORMAL HIGH (ref 0–99)
LDL/HDL Ratio: 4.8 ratio — ABNORMAL HIGH (ref 0.0–3.2)
Triglycerides: 114 mg/dL (ref 0–149)
VLDL Cholesterol Cal: 21 mg/dL (ref 5–40)

## 2022-05-29 LAB — HEMOGLOBIN A1C
Est. average glucose Bld gHb Est-mCnc: 120 mg/dL
Hgb A1c MFr Bld: 5.8 % — ABNORMAL HIGH (ref 4.8–5.6)

## 2022-05-29 LAB — VITAMIN D 25 HYDROXY (VIT D DEFICIENCY, FRACTURES): Vit D, 25-Hydroxy: 26.8 ng/mL — ABNORMAL LOW (ref 30.0–100.0)

## 2022-05-29 LAB — INSULIN, RANDOM: INSULIN: 91.7 u[IU]/mL — ABNORMAL HIGH (ref 2.6–24.9)

## 2022-05-29 NOTE — Progress Notes (Signed)
Chief Complaint:   OBESITY Chloe Pena is here to discuss her progress with her obesity treatment plan along with follow-up of her obesity related diagnoses. Chloe Pena is on keeping a food journal and adhering to recommended goals of 1700-1800 calories and 120+ grams of protein and states she is following her eating plan approximately 80% of the time. Chloe Pena states she is walking more.  Today's visit was #: 52 Starting weight: 349 lbs Starting date: 06/15/2021 Today's weight: 383 lbs Today's date: 05/28/2022 Total lbs lost to date: 0 lbs Total lbs lost since last in-office visit: 0  Interim History: Chloe Pena enjoyed [employer]. She unfortunately fell and did get a few abrasions. She is fasting for labs. She has been getting at least 80g of protein daily. Still struggling to get all calories in. She voices next few weeks she has upcoming vet bills. Does think she is ending up with 1400 calories for snack.  Subjective:   1. Insulin resistance Katrin is on Trulicity with no significant side effects. Last A1c 5.5, insulin 44.1.  2. Transaminitis Baila's last ALT 42, AST 30, Alk Phos 116. U/S in 2019 showing hepatic steatosis.  3. Dyslipidemia Total cholesterol 235, HDL 38, Trigly 150, LDL 169. Not on medications.  4. Vitamin D deficiency Chloe Pena is currently taking prescription Vit D 50,000 IU once a week. Last Vit D level of 24.4.  Assessment/Plan:   1. Insulin resistance We will obtain labs today.  - Hemoglobin A1c - Insulin, random  2. Transaminitis We will obtain labs today.  - Comprehensive metabolic panel  3. Dyslipidemia We will obtain labs today.  - Lipid Panel With LDL/HDL Ratio  4. Vitamin D deficiency We will obtain labs today.  - VITAMIN D 25 Hydroxy (Vit-D Deficiency, Fractures)  5. Obesity with current BMI of 63.9 Sheelah is currently in the action stage of change. As such, her goal is to continue with weight loss efforts. She has agreed to keeping a  food journal and adhering to recommended goals of 1700-1800 calories and 120+ grams of protein daily.   Exercise goals: All adults should avoid inactivity. Some physical activity is better than none, and adults who participate in any amount of physical activity gain some health benefits.  Behavioral modification strategies: increasing lean protein intake, meal planning and cooking strategies, and keeping healthy foods in the home.  Chloe Pena has agreed to follow-up with our clinic in 3 weeks. She was informed of the importance of frequent follow-up visits to maximize her success with intensive lifestyle modifications for her multiple health conditions.   Chloe Pena was informed we would discuss her lab results at her next visit unless there is a critical issue that needs to be addressed sooner. Chloe Pena agreed to keep her next visit at the agreed upon time to discuss these results.  Objective:   Blood pressure (!) 137/90, pulse 84, temperature 98.8 F (37.1 C), height '5\' 5"'  (1.651 m), weight (!) 383 lb (173.7 kg), SpO2 99 %. Body mass index is 63.73 kg/m.  General: Cooperative, alert, well developed, in no acute distress. HEENT: Conjunctivae and lids unremarkable. Cardiovascular: Regular rhythm.  Lungs: Normal work of breathing. Neurologic: No focal deficits.   Lab Results  Component Value Date   CREATININE 0.80 05/28/2022   BUN 10 05/28/2022   NA 138 05/28/2022   K 4.7 05/28/2022   CL 102 05/28/2022   CO2 22 05/28/2022   Lab Results  Component Value Date   ALT 34 (H) 05/28/2022  AST 24 05/28/2022   ALKPHOS 122 (H) 05/28/2022   BILITOT 0.9 05/28/2022   Lab Results  Component Value Date   HGBA1C 5.8 (H) 05/28/2022   HGBA1C 5.5 06/22/2021   HGBA1C 5.6 03/13/2021   HGBA1C 5.6 04/06/2020   HGBA1C 5.2 12/30/2017   Lab Results  Component Value Date   INSULIN 91.7 (H) 05/28/2022   INSULIN 44.1 (H) 06/22/2021   Lab Results  Component Value Date   TSH 2.200 06/22/2021   Lab  Results  Component Value Date   CHOL 240 (H) 05/28/2022   HDL 38 (L) 05/28/2022   LDLCALC 181 (H) 05/28/2022   TRIG 114 05/28/2022   CHOLHDL 7.3 04/06/2020   Lab Results  Component Value Date   VD25OH 26.8 (L) 05/28/2022   VD25OH 24.4 (L) 06/22/2021   VD25OH 29 (L) 02/10/2014   Lab Results  Component Value Date   WBC 10.8 02/14/2022   HGB 13.6 02/14/2022   HCT 39.8 02/14/2022   MCV 88 02/14/2022   PLT 326 02/14/2022   No results found for: "IRON", "TIBC", "FERRITIN"  Attestation Statements:   Reviewed by clinician on day of visit: allergies, medications, problem list, medical history, surgical history, family history, social history, and previous encounter notes.  I, Elnora Morrison, RMA am acting as transcriptionist for Coralie Common, MD.  I have reviewed the above documentation for accuracy and completeness, and I agree with the above. - Coralie Common, MD

## 2022-06-07 MED ORDER — TRULICITY 1.5 MG/0.5ML ~~LOC~~ SOAJ: 1.5000 mg | SUBCUTANEOUS | 0 refills | Status: DC

## 2022-06-15 ENCOUNTER — Other Ambulatory Visit (INDEPENDENT_AMBULATORY_CARE_PROVIDER_SITE_OTHER): Payer: Self-pay | Admitting: Family Medicine

## 2022-06-15 ENCOUNTER — Other Ambulatory Visit: Payer: Self-pay | Admitting: Family Medicine

## 2022-06-15 DIAGNOSIS — F419 Anxiety disorder, unspecified: Secondary | ICD-10-CM

## 2022-06-15 DIAGNOSIS — E88819 Insulin resistance, unspecified: Secondary | ICD-10-CM

## 2022-06-15 NOTE — Telephone Encounter (Signed)
Requested Prescriptions  Pending Prescriptions Disp Refills  . sertraline (ZOLOFT) 100 MG tablet [Pharmacy Med Name: SERTRALINE HCL 100 MG ORAL TABLET] 90 tablet 0    Sig: TAKE 1 TABLET (100 MG TOTAL) BY MOUTH DAILY (AM)     Psychiatry:  Antidepressants - SSRI - sertraline Failed - 06/15/2022 10:24 AM      Failed - ALT in normal range and within 360 days    ALT  Date Value Ref Range Status  05/28/2022 34 (H) 0 - 32 IU/L Final         Passed - AST in normal range and within 360 days    AST  Date Value Ref Range Status  05/28/2022 24 0 - 40 IU/L Final         Passed - Completed PHQ-2 or PHQ-9 in the last 360 days      Passed - Valid encounter within last 6 months    Recent Outpatient Visits          3 months ago Anxiety and depression   [place], Charlane Ferretti, MD   6 months ago Anxiety and depression   [place], Charlane Ferretti, MD   1 year ago Anxiety and depression   [place], Charlane Ferretti, MD   1 year ago Elevated LFTs   [place], Charlane Ferretti, MD   1 year ago Severe episode of recurrent major depressive disorder, without psychotic features [facility])   [place], [place], MD      Future Appointments            In 2 months Charlott Rakes, MD [place]

## 2022-06-25 ENCOUNTER — Encounter (INDEPENDENT_AMBULATORY_CARE_PROVIDER_SITE_OTHER): Payer: Self-pay | Admitting: Family Medicine

## 2022-06-25 ENCOUNTER — Ambulatory Visit (INDEPENDENT_AMBULATORY_CARE_PROVIDER_SITE_OTHER): Payer: Commercial Managed Care - HMO | Admitting: Family Medicine

## 2022-06-25 VITALS — BP 112/76 | HR 90 | Temp 98.5°F | Ht 65.0 in | Wt 385.0 lb

## 2022-06-25 DIAGNOSIS — E88819 Insulin resistance, unspecified: Secondary | ICD-10-CM | POA: Diagnosis not present

## 2022-06-25 DIAGNOSIS — Z6841 Body Mass Index (BMI) 40.0 and over, adult: Secondary | ICD-10-CM

## 2022-06-25 DIAGNOSIS — E669 Obesity, unspecified: Secondary | ICD-10-CM | POA: Diagnosis not present

## 2022-06-25 DIAGNOSIS — E782 Mixed hyperlipidemia: Secondary | ICD-10-CM

## 2022-06-25 DIAGNOSIS — E559 Vitamin D deficiency, unspecified: Secondary | ICD-10-CM | POA: Diagnosis not present

## 2022-06-25 MED ORDER — EZETIMIBE 10 MG PO TABS: 10.0000 mg | ORAL_TABLET | Freq: Every day | ORAL | 0 refills | Status: DC

## 2022-06-25 MED ORDER — CHOLECALCIFEROL 1.25 MG (50000 UT) PO TABS: 1.0000 | ORAL_TABLET | ORAL | 0 refills | Status: DC

## 2022-06-25 MED ORDER — TRULICITY 1.5 MG/0.5ML ~~LOC~~ SOAJ: 1.5000 mg | SUBCUTANEOUS | 0 refills | Status: DC

## 2022-06-27 NOTE — Progress Notes (Signed)
Chief Complaint:   OBESITY Chloe Pena is here to discuss her progress with her obesity treatment plan along with follow-up of her obesity related diagnoses. Chloe Pena is on keeping a food journal and adhering to recommended goals of 1700-1800 calories and 120+ grams of protein and states she is following her eating plan approximately 50% of the time. Chloe Pena states she is gardening and doing chores 60 minutes 7 times per week.  Today's visit was #: 82 Starting weight: 349 lbs Starting date: 06/15/2021 Today's weight: 385 lbs Today's date: 06/25/2022 Total lbs lost to date: 0 lbs Total lbs lost since last in-office visit: 0  Interim History: Chloe Pena has had a stressful few weeks--she is arguing with her new advisor and trying to not go days without eating. She voices she is an emotional non eater. She is averaging 1300 cal/daily. She can tolerate Choc fudge. Belvita bars so she is adding that in to calories. She reports monthly budget of 400$ for groceries.  Subjective:   1. Insulin resistance Labs discussed during visit today (worsening). Chloe Pena is on Trulicity with decent appetite control. Denies GI side effects except the one week she missed the dose. A1c 5.8, insulin >90.  2. Mixed hyperlipidemia Labs discussed during visit today (worsening). LDL 181, HDL 38, Trigly 114. Previous LDL low of 169.  3. Vitamin D deficiency Labs discussed during visit today (worsening). Increase on Vit D. Denies any nausea, vomiting or muscle weakness. She notes fatigue.  Assessment/Plan:   1. Insulin resistance We will refill Trulicity 1.5 mg SubQ once weekly for 1 month with 0 refills.  -Refill Dulaglutide (TRULICITY) 1.5 YD/7.4JO SOPN; Inject 1.5 mg into the skin once a week.  Dispense: 2 mL; Refill: 0  2. Mixed hyperlipidemia START Ezetimibe 10 mg by mouth daily for 1 month with 0 refills.  -Start ezetimibe (ZETIA) 10 MG tablet; Take 1 tablet (10 mg total) by mouth daily.  Dispense: 30 tablet; Refill:  0  3. Vitamin D deficiency Stop ergocalciferol. Retest in 3 months. Start Cholecalciferol 50k IU/weekly for 1 month with 0 refills.  -Start Cholecalciferol 1.25 MG (50000 UT) TABS; Take 1 tablet by mouth once a week.  Dispense: 4 tablet; Refill: 0  4. Obesity with current BMI of 64.1 Chloe Pena is currently in the action stage of change. As such, her goal is to continue with weight loss efforts. She has agreed to keeping a food journal and adhering to recommended goals of 1700-1800 calories and 120+ grams of protein daily.   Exercise goals: All adults should avoid inactivity. Some physical activity is better than none, and adults who participate in any amount of physical activity gain some health benefits.   Chloe Pena asked to use diary log to total calorie intake and protein intake/day.  Behavioral modification strategies: increasing lean protein intake, meal planning and cooking strategies, keeping healthy foods in the home, and planning for success.  Chloe Pena has agreed to follow-up with our clinic in 4 weeks. She was informed of the importance of frequent follow-up visits to maximize her success with intensive lifestyle modifications for her multiple health conditions.   Objective:   Blood pressure 112/76, pulse 90, temperature 98.5 F (36.9 C), height '5\' 5"'$  (1.651 m), weight (!) 385 lb (174.6 kg), SpO2 98 %. Body mass index is 64.07 kg/m.  General: Cooperative, alert, well developed, in no acute distress. HEENT: Conjunctivae and lids unremarkable. Cardiovascular: Regular rhythm.  Lungs: Normal work of breathing. Neurologic: No focal deficits.   Lab Results  Component Value Date   CREATININE 0.80 05/28/2022   BUN 10 05/28/2022   NA 138 05/28/2022   K 4.7 05/28/2022   CL 102 05/28/2022   CO2 22 05/28/2022   Lab Results  Component Value Date   ALT 34 (H) 05/28/2022   AST 24 05/28/2022   ALKPHOS 122 (H) 05/28/2022   BILITOT 0.9 05/28/2022   Lab Results  Component Value Date    HGBA1C 5.8 (H) 05/28/2022   HGBA1C 5.5 06/22/2021   HGBA1C 5.6 03/13/2021   HGBA1C 5.6 04/06/2020   HGBA1C 5.2 12/30/2017   Lab Results  Component Value Date   INSULIN 91.7 (H) 05/28/2022   INSULIN 44.1 (H) 06/22/2021   Lab Results  Component Value Date   TSH 2.200 06/22/2021   Lab Results  Component Value Date   CHOL 240 (H) 05/28/2022   HDL 38 (L) 05/28/2022   LDLCALC 181 (H) 05/28/2022   TRIG 114 05/28/2022   CHOLHDL 7.3 04/06/2020   Lab Results  Component Value Date   VD25OH 26.8 (L) 05/28/2022   VD25OH 24.4 (L) 06/22/2021   VD25OH 29 (L) 02/10/2014   Lab Results  Component Value Date   WBC 10.8 02/14/2022   HGB 13.6 02/14/2022   HCT 39.8 02/14/2022   MCV 88 02/14/2022   PLT 326 02/14/2022   No results found for: "IRON", "TIBC", "FERRITIN"  Attestation Statements:   Reviewed by clinician on day of visit: allergies, medications, problem list, medical history, surgical history, family history, social history, and previous encounter notes.  I, Elnora Morrison, RMA am acting as transcriptionist for Coralie Common, MD.  I have reviewed the above documentation for accuracy and completeness, and I agree with the above. - Coralie Common, MD

## 2022-07-26 ENCOUNTER — Ambulatory Visit (INDEPENDENT_AMBULATORY_CARE_PROVIDER_SITE_OTHER): Payer: Commercial Managed Care - HMO | Admitting: Family Medicine

## 2022-07-26 VITALS — BP 110/70 | HR 98 | Temp 99.2°F | Ht 65.0 in | Wt 384.0 lb

## 2022-07-26 DIAGNOSIS — E88819 Insulin resistance, unspecified: Secondary | ICD-10-CM

## 2022-07-26 DIAGNOSIS — E782 Mixed hyperlipidemia: Secondary | ICD-10-CM | POA: Diagnosis not present

## 2022-07-26 DIAGNOSIS — E669 Obesity, unspecified: Secondary | ICD-10-CM | POA: Diagnosis not present

## 2022-07-26 DIAGNOSIS — E559 Vitamin D deficiency, unspecified: Secondary | ICD-10-CM

## 2022-07-26 DIAGNOSIS — Z6841 Body Mass Index (BMI) 40.0 and over, adult: Secondary | ICD-10-CM

## 2022-07-26 MED ORDER — TRULICITY 1.5 MG/0.5ML ~~LOC~~ SOAJ: 1.5000 mg | SUBCUTANEOUS | 0 refills | Status: DC

## 2022-07-26 MED ORDER — CHOLECALCIFEROL 1.25 MG (50000 UT) PO TABS: 1.0000 | ORAL_TABLET | ORAL | 0 refills | Status: DC

## 2022-07-26 MED ORDER — EZETIMIBE 10 MG PO TABS: 10.0000 mg | ORAL_TABLET | Freq: Every day | ORAL | 0 refills | Status: DC

## 2022-08-01 NOTE — Progress Notes (Signed)
Chief Complaint:   OBESITY Chloe Pena is here to discuss her progress with her obesity treatment plan along with follow-up of her obesity related diagnoses. Chloe Pena is on keeping a food journal and adhering to recommended goals of 1700-1800 calories and 120+ grams of protein and states she is following her eating plan approximately 75% of the time. Chloe Pena states she is gardening/chores 20 minutes 7 times per week.  Today's visit was #: 55 Starting weight: 349 lbs Starting date: 06/15/2021 Today's weight: 384 lbs Today's date: 07/26/2022 Total lbs lost to date: 0 lbs Total lbs lost since last in-office visit: 1  Interim History: Chloe Pena is able to get breakfast and lunch in daily. Only 2 days she hit calorie and protein goal was when she made chicken phillies on Naan bread. She is trying to examine how to increase calories and increase protein amount. Has been logging food but unfortunately forgot sheets at home.  Subjective:   1. Vitamin D deficiency Denies any nausea, vomiting or muscle weakness. She notes fatigue. Chloe Pena's Vit D level of 26.8.  2. Mixed hyperlipidemia Chloe Pena's last LDL of 181, HDL 38, Trigly of 114. She is on Zetia, no side effects noted.  3. Insulin resistance Chloe Pena is on Trulicity 1.5 mg SubQ once weekly. A1c 5.8, insulin 91.7.  Assessment/Plan:   1. Vitamin D deficiency We will refill Vit D 50K IU once a week for 1 month with 0 refills.  -Refill Cholecalciferol 1.25 MG (50000 UT) TABS; Take 1 tablet by mouth once a week.  Dispense: 4 tablet; Refill: 0  2. Mixed hyperlipidemia We will refill Zetia 10 mg by mouth daily for 1 month with 0 refills.  -Refill ezetimibe (ZETIA) 10 MG tablet; Take 1 tablet (10 mg total) by mouth daily.  Dispense: 30 tablet; Refill: 0  3. Insulin resistance We will refill Trulicity 1.5 mg SubQ once weekly for 1 month with 0 refills.  -Refill Dulaglutide (TRULICITY) 1.5 MW/4.1LK SOPN; Inject 1.5 mg into the skin once a week.  Dispense:  2 mL; Refill: 0  4. Obesity with current BMI of 64.0 [name] is currently in the action stage of change. As such, her goal is to continue with weight loss efforts. She has agreed to keeping a food journal and adhering to recommended goals of 1700-1800 calories and 120+ grams of protein daily.   Exercise goals: As is.  Behavioral modification strategies: increasing lean protein intake, meal planning and cooking strategies, keeping healthy foods in the home, and planning for success.  Chloe Pena has agreed to follow-up with our clinic in 4 weeks. She was informed of the importance of frequent follow-up visits to maximize her success with intensive lifestyle modifications for her multiple health conditions.   Objective:   Blood pressure 110/70, pulse 98, temperature 99.2 F (37.3 C), height '5\' 5"'$  (1.651 m), weight (!) 384 lb (174.2 kg), SpO2 100 %. Body mass index is 63.9 kg/m.  General: Cooperative, alert, well developed, in no acute distress. HEENT: Conjunctivae and lids unremarkable. Cardiovascular: Regular rhythm.  Lungs: Normal work of breathing. Neurologic: No focal deficits.   Lab Results  Component Value Date   CREATININE 0.80 05/28/2022   BUN 10 05/28/2022   NA 138 05/28/2022   K 4.7 05/28/2022   CL 102 05/28/2022   CO2 22 05/28/2022   Lab Results  Component Value Date   ALT 34 (H) 05/28/2022   AST 24 05/28/2022   ALKPHOS 122 (H) 05/28/2022   BILITOT 0.9 05/28/2022  Lab Results  Component Value Date   HGBA1C 5.8 (H) 05/28/2022   HGBA1C 5.5 06/22/2021   HGBA1C 5.6 03/13/2021   HGBA1C 5.6 04/06/2020   HGBA1C 5.2 12/30/2017   Lab Results  Component Value Date   INSULIN 91.7 (H) 05/28/2022   INSULIN 44.1 (H) 06/22/2021   Lab Results  Component Value Date   TSH 2.200 06/22/2021   Lab Results  Component Value Date   CHOL 240 (H) 05/28/2022   HDL 38 (L) 05/28/2022   LDLCALC 181 (H) 05/28/2022   TRIG 114 05/28/2022   CHOLHDL 7.3 04/06/2020   Lab Results   Component Value Date   VD25OH 26.8 (L) 05/28/2022   VD25OH 24.4 (L) 06/22/2021   VD25OH 29 (L) 02/10/2014   Lab Results  Component Value Date   WBC 10.8 02/14/2022   HGB 13.6 02/14/2022   HCT 39.8 02/14/2022   MCV 88 02/14/2022   PLT 326 02/14/2022   No results found for: "IRON", "TIBC", "FERRITIN"  Attestation Statements:   Reviewed by clinician on day of visit: allergies, medications, problem list, medical history, surgical history, family history, social history, and previous encounter notes.  I, Elnora Morrison, RMA am acting as transcriptionist for Coralie Common, MD.  I have reviewed the above documentation for accuracy and completeness, and I agree with the above. - Coralie Common, MD

## 2022-08-20 ENCOUNTER — Other Ambulatory Visit: Payer: Self-pay | Admitting: Family Medicine

## 2022-08-20 ENCOUNTER — Other Ambulatory Visit (INDEPENDENT_AMBULATORY_CARE_PROVIDER_SITE_OTHER): Payer: Self-pay | Admitting: Family Medicine

## 2022-08-20 DIAGNOSIS — G43709 Chronic migraine without aura, not intractable, without status migrainosus: Secondary | ICD-10-CM

## 2022-08-20 DIAGNOSIS — E782 Mixed hyperlipidemia: Secondary | ICD-10-CM

## 2022-08-27 ENCOUNTER — Ambulatory Visit (INDEPENDENT_AMBULATORY_CARE_PROVIDER_SITE_OTHER): Payer: Commercial Managed Care - HMO | Admitting: Family Medicine

## 2022-08-27 ENCOUNTER — Encounter (INDEPENDENT_AMBULATORY_CARE_PROVIDER_SITE_OTHER): Payer: Self-pay | Admitting: Family Medicine

## 2022-08-27 VITALS — BP 135/81 | HR 78 | Temp 99.1°F | Ht 65.0 in | Wt 387.0 lb

## 2022-08-27 DIAGNOSIS — E782 Mixed hyperlipidemia: Secondary | ICD-10-CM

## 2022-08-27 DIAGNOSIS — R7303 Prediabetes: Secondary | ICD-10-CM | POA: Diagnosis not present

## 2022-08-27 DIAGNOSIS — E559 Vitamin D deficiency, unspecified: Secondary | ICD-10-CM

## 2022-08-27 DIAGNOSIS — E669 Obesity, unspecified: Secondary | ICD-10-CM | POA: Diagnosis not present

## 2022-08-27 DIAGNOSIS — Z6841 Body Mass Index (BMI) 40.0 and over, adult: Secondary | ICD-10-CM

## 2022-08-27 MED ORDER — EZETIMIBE 10 MG PO TABS: 10.0000 mg | ORAL_TABLET | Freq: Every day | ORAL | 0 refills | Status: DC

## 2022-08-27 MED ORDER — TRULICITY 1.5 MG/0.5ML ~~LOC~~ SOAJ: 1.5000 mg | SUBCUTANEOUS | 0 refills | Status: DC

## 2022-08-27 MED ORDER — CHOLECALCIFEROL 1.25 MG (50000 UT) PO TABS: 1.0000 | ORAL_TABLET | ORAL | 0 refills | Status: DC

## 2022-08-29 ENCOUNTER — Encounter: Payer: Self-pay | Admitting: Nurse Practitioner

## 2022-08-29 ENCOUNTER — Ambulatory Visit: Payer: Commercial Managed Care - HMO | Attending: Family Medicine | Admitting: Family Medicine

## 2022-08-29 ENCOUNTER — Encounter: Payer: Self-pay | Admitting: Family Medicine

## 2022-08-29 VITALS — BP 124/78 | HR 86 | Temp 99.0°F | Ht 65.0 in | Wt 391.4 lb

## 2022-08-29 DIAGNOSIS — R7303 Prediabetes: Secondary | ICD-10-CM

## 2022-08-29 DIAGNOSIS — G43709 Chronic migraine without aura, not intractable, without status migrainosus: Secondary | ICD-10-CM

## 2022-08-29 DIAGNOSIS — E8881 Metabolic syndrome: Secondary | ICD-10-CM | POA: Diagnosis not present

## 2022-08-29 DIAGNOSIS — F419 Anxiety disorder, unspecified: Secondary | ICD-10-CM | POA: Diagnosis not present

## 2022-08-29 DIAGNOSIS — Z6841 Body Mass Index (BMI) 40.0 and over, adult: Secondary | ICD-10-CM

## 2022-08-29 DIAGNOSIS — F32A Depression, unspecified: Secondary | ICD-10-CM

## 2022-08-29 DIAGNOSIS — T300 Burn of unspecified body region, unspecified degree: Secondary | ICD-10-CM

## 2022-08-29 DIAGNOSIS — E282 Polycystic ovarian syndrome: Secondary | ICD-10-CM

## 2022-08-29 DIAGNOSIS — E7800 Pure hypercholesterolemia, unspecified: Secondary | ICD-10-CM

## 2022-08-29 MED ORDER — TOPIRAMATE 50 MG PO TABS: ORAL_TABLET | ORAL | 1 refills | Status: DC

## 2022-08-29 MED ORDER — SERTRALINE HCL 100 MG PO TABS: ORAL_TABLET | ORAL | 1 refills | Status: DC

## 2022-08-29 MED ORDER — SILVER SULFADIAZINE 1 % EX CREA: 1.0000 | TOPICAL_CREAM | Freq: Two times a day (BID) | CUTANEOUS | 0 refills | Status: DC

## 2022-08-29 MED ORDER — BUPROPION HCL ER (XL) 150 MG PO TB24: 150.0000 mg | ORAL_TABLET | Freq: Every day | ORAL | 1 refills | Status: DC

## 2022-08-29 NOTE — Patient Instructions (Signed)
Bupropion Tablets (Depression/Mood Disorders) What is this medication? BUPROPION (byoo PROE pee on) treats depression. It increases norepinephrine and dopamine in the brain, hormones that help regulate mood. It belongs to a group of medications called NDRIs. This medicine may be used for other purposes; ask your health care provider or pharmacist if you have questions. COMMON BRAND NAME(S): Wellbutrin What should I tell my care team before I take this medication? They need to know if you have any of these conditions: An eating disorder, such as anorexia or bulimia Bipolar disorder or psychosis Diabetes or high blood sugar, treated with medication Glaucoma Heart disease, previous heart attack, or irregular heart beat Head injury or brain tumor High blood pressure Kidney or liver disease Seizures Suicidal thoughts or a previous suicide attempt Tourette's syndrome Weight loss An unusual or allergic reaction to bupropion, other medications, foods, dyes, or preservatives Pregnant or trying to become pregnant Breast-feeding How should I use this medication? Take this medication by mouth with a glass of water. Follow the directions on the prescription label. You can take it with or without food. If it upsets your stomach, take it with food. Take your medication at regular intervals. Do not take your medication more often than directed. Do not stop taking this medication suddenly except upon the advice of your care team. Stopping this medication too quickly may cause serious side effects or your condition may worsen. A special MedGuide will be given to you by the pharmacist with each prescription and refill. Be sure to read this information carefully each time. Talk to your care team regarding the use of this medication in children. Special care may be needed. Overdosage: If you think you have taken too much of this medicine contact a poison control center or emergency room at once. NOTE: This  medicine is only for you. Do not share this medicine with others. What if I miss a dose? If you miss a dose, take it as soon as you can. If it is less than four hours to your next dose, take only that dose and skip the missed dose. Do not take double or extra doses. What may interact with this medication? Do not take this medication with any of the following: Linezolid MAOIs like Azilect, Carbex, Eldepryl, Marplan, Nardil, and Parnate Methylene blue (injected into a vein) Other medications that contain bupropion like Zyban This medication may also interact with the following: Alcohol Certain medications for anxiety or sleep Certain medications for blood pressure like metoprolol, propranolol Certain medications for depression or psychotic disturbances Certain medications for HIV or AIDS like efavirenz, lopinavir, nelfinavir, ritonavir Certain medications for irregular heart beat like propafenone, flecainide Certain medications for Parkinson's disease like amantadine, levodopa Certain medications for seizures like carbamazepine, phenytoin, phenobarbital Cimetidine Clopidogrel Cyclophosphamide Digoxin Furazolidone Isoniazid Nicotine Orphenadrine Procarbazine Steroid medications like prednisone or cortisone Stimulant medications for attention disorders, weight loss, or to stay awake Tamoxifen Theophylline Thiotepa Ticlopidine Tramadol Warfarin This list may not describe all possible interactions. Give your health care provider a list of all the medicines, herbs, non-prescription drugs, or dietary supplements you use. Also tell them if you smoke, drink alcohol, or use illegal drugs. Some items may interact with your medicine. What should I watch for while using this medication? Tell your care team if your symptoms do not get better or if they get worse. Visit your care team for regular checks on your progress. Because it may take several weeks to see the full effects of this  medication,  it is important to continue your treatment as prescribed. Watch for new or worsening thoughts of suicide or depression. This includes sudden changes in mood, behavior, or thoughts. These changes can happen at any time but are more common in the beginning of treatment or after a change in dose. Call your care team right away if you experience these thoughts or worsening depression. Manic episodes may happen in patients with bipolar disorder who take this medication. Watch for changes in feelings or behaviors such as feeling anxious, nervous, agitated, panicky, irritable, hostile, aggressive, impulsive, severely restless, overly excited and hyperactive, or trouble sleeping. These symptoms can happen at anytime but are more common in the beginning of treatment or after a change in dose. Call your care team right away if you notice any of these symptoms. This medication may cause serious skin reactions. They can happen weeks to months after starting the medication. Contact your care team right away if you notice fevers or flu-like symptoms with a rash. The rash may be red or purple and then turn into blisters or peeling of the skin. Or, you might notice a red rash with swelling of the face, lips or lymph nodes in your neck or under your arms. Avoid drinks that contain alcohol while taking this medication. Drinking large amounts of alcohol, using sleeping or anxiety medications, or quickly stopping the use of these agents while taking this medication may increase your risk for a seizure. Do not drive or use heavy machinery until you know how this medication affects you. This medication can impair your ability to perform these tasks. Do not take this medication close to bedtime. It may prevent you from sleeping. Your mouth may get dry. Chewing sugarless gum or sucking hard candy, and drinking plenty of water may help. Contact your care team if the problem does not go away or is severe. What side  effects may I notice from receiving this medication? Side effects that you should report to your care team as soon as possible: Allergic reactions--skin rash, itching, hives, swelling of the face, lips, tongue, or throat Increase in blood pressure Mood and behavior changes--anxiety, nervousness, confusion, hallucinations, irritability, hostility, thoughts of suicide or self-harm, worsening mood, feelings of depression Redness, blistering, peeling, or loosening of the skin, including inside the mouth Seizures Sudden eye pain or change in vision such as blurry vision, seeing halos around lights, vision loss Side effects that usually do not require medical attention (report to your care team if they continue or are bothersome): Constipation Dizziness Dry mouth Loss of appetite Nausea Tremors or shaking Trouble sleeping This list may not describe all possible side effects. Call your doctor for medical advice about side effects. You may report side effects to FDA at 1-800-FDA-1088. Where should I keep my medication? Keep out of the reach of children and pets. Store at room temperature between 20 and 25 degrees C (68 and 77 degrees F), away from direct sunlight and moisture. Keep tightly closed. Throw away any unused medication after the expiration date. NOTE: This sheet is a summary. It may not cover all possible information. If you have questions about this medicine, talk to your doctor, pharmacist, or health care provider.  2023 Elsevier/Gold Standard (2020-09-05 00:00:00)

## 2022-08-29 NOTE — Progress Notes (Signed)
Subjective:  Patient ID: Chloe Pena, female    DOB: 1995/11/06  Age: 26 y.o. MRN: [id number]  CC: Depression   HPI Chloe Pena is a 26 y.o. year old female with a history of anxiety and depression, migraines, acne, PCOS, chronic back pain, prediabetes (M2N 5.8), metabolic syndrome here for follow-up visit   Interval History:  Anxiety and depression have not been controlled on her current dose of Zoloft and she would like an increase in the dose. Cymbalta did cause nausea even though it was effective and she had to discontinue the Cymbalta.  She has been on Nexplanon and had a new one inserted last year and has gained 30 lbs. In 12/2021 she weighed 367 pounds and today she weighs 397 pounds.  This is discouraging for her as she has been attending the weight management clinic and adhering to the dietary recommendations.  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  She also gardens but does not actively go out to exercise. Also on Trulicity from her weight management clinician and has been on Zetia for hyperlipidemia. She is concerned that her weights put her at risk of cardiovascular disease and diabetes which her mother already has. She has OBGYN appointment on Monday and wanted to discuss contraceptive options.  She was placed in prone on mainly because of her PCOS and irregular periods.  She had a burn on her right forearm yesterday when grease from ground beef splattered on her arm but she did not develop a blister. Past Medical History:  Diagnosis Date   Anxiety    no meds   Asthma    as a child, no inhaler, no problems as adult   Depression    Fatty liver    Migraine    otc med prn   PCOS (polycystic ovarian syndrome)     Past Surgical History:  Procedure Laterality Date   LAPAROSCOPIC OVARIAN CYSTECTOMY N/A 02/11/2018   Procedure: LAPAROSCOPIC OVARIAN CYSTECTOMY;  Surgeon: Sloan Leiter, MD;  Location:  [facility];  Service: Gynecology;  Laterality: N/A;   WISDOM TOOTH EXTRACTION      Family History  Problem Relation Age of Onset   Migraines Mother    GER disease Mother    Heart disease Mother    Heart failure Mother    Obesity Mother    Diabetes Father    Obesity Father    Thyroid disease Brother    Breast cancer Maternal Grandmother    Hypertension Maternal Grandmother    Diabetes Maternal Grandmother     Social History   Socioeconomic History   Marital status: Single    Spouse name: Not on file   Number of children: Not on file   Years of education: Not on file   Highest education level: Not on file  Occupational History   Occupation: Student  Tobacco Use   Smoking status: Never   Smokeless tobacco: Never  Vaping Use   Vaping Use: Never used  Substance and Sexual Activity   Alcohol use: No   Drug use: No   Sexual activity: Never    Birth control/protection: Implant    Comment: Nexplanon replaced 05/18/2021  Other Topics Concern   Not on file  Social History Narrative   Not on file   Social Determinants of Health   Financial Resource Strain: Not on file  Food Insecurity: Not on file  Transportation Needs: Not on file  Physical Activity: Not on file  Stress: Not on file  Social Connections: Not on file    No Known Allergies  Outpatient Medications Prior to Visit  Medication Sig Dispense Refill   Cholecalciferol 1.25 MG (50000 UT) TABS Take 1 tablet by mouth once a week. 4 tablet 0   Dulaglutide (TRULICITY) 1.5 GD/9.2EQ SOPN Inject 1.5 mg into the skin once a week. 2 mL 0   etonogestrel (NEXPLANON) 68 MG IMPL implant 1 each by Subdermal route once.     ezetimibe (ZETIA) 10 MG tablet Take 1 tablet (10 mg total) by mouth daily. 90 tablet 0   triamcinolone cream (KENALOG) 0.1 % Apply 1 application  topically 2 (two) times daily. 45 g 1   sertraline (ZOLOFT) 100 MG tablet TAKE 1 TABLET (100 MG TOTAL) BY MOUTH DAILY (AM) 90 tablet 0   topiramate (TOPAMAX) 25  MG tablet TAKE 1 TABLET (25 MG TOTAL) BY MOUTH DAILY. (AM) 30 tablet 0   No facility-administered medications prior to visit.     ROS Review of Systems  Constitutional:  Negative for activity change and appetite change.  HENT:  Negative for sinus pressure and sore throat.   Respiratory:  Negative for chest tightness, shortness of breath and wheezing.   Cardiovascular:  Negative for chest pain and palpitations.  Gastrointestinal:  Negative for abdominal distention, abdominal pain and constipation.  Genitourinary: Negative.   Musculoskeletal: Negative.   Psychiatric/Behavioral:  Positive for dysphoric mood. Negative for behavioral problems.     Objective:  BP 124/78   Pulse 86   Temp 99 F (37.2 C) (Oral)   Ht '5\' 5"'$  (1.651 m)   Wt (!) 391 lb 6.4 oz (177.5 kg)   SpO2 97%   BMI 65.13 kg/m      08/29/2022    1:40 PM 08/27/2022   10:00 AM 07/26/2022   12:00 PM  BP/Weight  Systolic BP 683 419 622  Diastolic BP 78 81 70  Wt. (Lbs) 391.4 387 384  BMI 65.13 kg/m2 64.4 kg/m2 63.9 kg/m2      Physical Exam Constitutional:      Appearance: She is well-developed.  Cardiovascular:     Rate and Rhythm: Normal rate.     Heart sounds: Normal heart sounds. No murmur heard. Pulmonary:     Effort: Pulmonary effort is normal.     Breath sounds: Normal breath sounds. No wheezing or rales.  Chest:     Chest wall: No tenderness.  Abdominal:     General: Bowel sounds are normal. There is no distension.     Palpations: Abdomen is soft. There is no mass.     Tenderness: There is no abdominal tenderness.  Musculoskeletal:        General: Normal range of motion.     Right lower leg: No edema.     Left lower leg: No edema.  Neurological:     Mental Status: She is alert and oriented to person, place, and time.  Psychiatric:     Comments: Dysphoric mood        Latest Ref Rng & Units 05/28/2022   10:47 AM 02/14/2022    2:53 PM 06/22/2021    1:42 PM  CMP  Glucose 70 - 99 mg/dL 78   82  76   BUN 6 - 20 mg/dL '10  10  13   '$ Creatinine 0.57 - 1.00 mg/dL 0.80  0.73  0.91   Sodium 134 - 144 mmol/L 138  140  140  Potassium 3.5 - 5.2 mmol/L 4.7  4.4  4.5   Chloride 96 - 106 mmol/L 102  105  104   CO2 20 - 29 mmol/L '22  21  18   '$ Calcium 8.7 - 10.2 mg/dL 9.2  9.4  9.4   Total Protein 6.0 - 8.5 g/dL 7.2  7.2  7.4   Total Bilirubin 0.0 - 1.2 mg/dL 0.9  0.8  1.1   Alkaline Phos 44 - 121 IU/L 122  116  112   AST 0 - 40 IU/L '24  30  25   '$ ALT 0 - 32 IU/L 34  42  29     Lipid Panel     Component Value Date/Time   CHOL 240 (H) 05/28/2022 1047   TRIG 114 05/28/2022 1047   HDL 38 (L) 05/28/2022 1047   CHOLHDL 7.3 04/06/2020 0235   VLDL 34 04/06/2020 0235   LDLCALC 181 (H) 05/28/2022 1047    CBC    Component Value Date/Time   WBC 10.8 02/14/2022 1453   WBC 12.0 (H) 02/03/2018 0830   RBC 4.50 02/14/2022 1453   RBC 3.83 (L) 02/03/2018 0830   HGB 13.6 02/14/2022 1453   HCT 39.8 02/14/2022 1453   PLT 326 02/14/2022 1453   MCV 88 02/14/2022 1453   MCH 30.2 02/14/2022 1453   MCH 30.8 02/03/2018 0830   MCHC 34.2 02/14/2022 1453   MCHC 32.1 02/03/2018 0830   RDW 12.3 02/14/2022 1453   LYMPHSABS 2.9 02/14/2022 1453   MONOABS 0.7 03/09/2009 0803   EOSABS 0.2 02/14/2022 1453   BASOSABS 0.2 02/14/2022 1453    Lab Results  Component Value Date   HGBA1C 5.8 (H) 05/28/2022    Assessment & Plan:  1. Prediabetes Labs reveal prediabetes with an A1c of 5.8.  Working on a low carbohydrate diet, exercise, weight loss is recommended in order to prevent progression to type 2 diabetes mellitus. Continue Trulicity per weight management clinic   2. Anxiety and depression Uncontrolled Will place on Wellbutrin which also help with her weight loss Underlying trauma largely contributing to poor control as well as inability to lose weight - buPROPion (WELLBUTRIN XL) 150 MG 24 hr tablet; Take 1 tablet (150 mg total) by mouth daily.  Dispense: 90 tablet; Refill: 1 - sertraline  (ZOLOFT) 100 MG tablet; TAKE 1 TABLET (100 MG TOTAL) BY MOUTH DAILY (AM)  Dispense: 90 tablet; Refill: 1  3. Chronic migraine without aura without status migrainosus, not intractable Stable Increased Topamax dose for additional weight loss benefit - topiramate (TOPAMAX) 50 MG tablet; TAKE 1 TABLET (25 MG TOTAL) BY MOUTH DAILY. (AM)  Dispense: 90 tablet; Refill: 1  4. Morbid obesity ([place]) Adverse effects of contraceptives also contributing She is working on caloric restrictions Encouraged to begin a daily walking exercise regimen Continue GLP-1 RA  5. Pure hypercholesterolemia Uncontrolled She is on Zetia Ultrasound of the abdomen from 2019 revealed hepatic steatosis Will send off lipid panel and if still uncontrolled we will initiate statin - LP+Non-HDL Cholesterol; Future  6. Metabolic syndrome She does have abdominal obesity, insulin resistance, decreased HDL Strong family history of CVD Risk factor modification Continue with GLP-1 RA, exercise She will be a candidate for bariatric surgery  7. Superficial burn Slight erythema but no evidence of blister formation - silver sulfADIAZINE (SILVADENE) 1 % cream; Apply 1 Application topically 2 (two) times daily.  Dispense: 50 g; Refill: 0  8.  PCOS She is concerned about the weight gain adverse effects of  her Nexplanon She will be discussing this with her OB/GYN as she is currently not sexually active and does not need contraception.  She would rather have the irregular cycles to prevent weight gain.   Meds ordered this encounter  Medications   buPROPion (WELLBUTRIN XL) 150 MG 24 hr tablet    Sig: Take 1 tablet (150 mg total) by mouth daily.    Dispense:  90 tablet    Refill:  1   sertraline (ZOLOFT) 100 MG tablet    Sig: TAKE 1 TABLET (100 MG TOTAL) BY MOUTH DAILY (AM)    Dispense:  90 tablet    Refill:  1   topiramate (TOPAMAX) 50 MG tablet    Sig: TAKE 1 TABLET (25 MG TOTAL) BY MOUTH DAILY. (AM)    Dispense:  90 tablet     Refill:  1    Dose increase   silver sulfADIAZINE (SILVADENE) 1 % cream    Sig: Apply 1 Application topically 2 (two) times daily.    Dispense:  50 g    Refill:  0    Follow-up: Return in about 6 months (around 02/28/2023) for Chronic medical conditions.       Charlott Rakes, MD, FAAFP. [facility] [place], [facility] [place]   08/29/2022, 2:20 PM

## 2022-08-29 NOTE — Progress Notes (Signed)
Increase depression medication Discuss Birth Control Burn on right arm.

## 2022-08-30 ENCOUNTER — Ambulatory Visit: Payer: Commercial Managed Care - HMO | Attending: Family Medicine

## 2022-08-30 DIAGNOSIS — E7800 Pure hypercholesterolemia, unspecified: Secondary | ICD-10-CM

## 2022-08-31 ENCOUNTER — Encounter: Payer: Self-pay | Admitting: Family Medicine

## 2022-08-31 ENCOUNTER — Other Ambulatory Visit: Payer: Self-pay | Admitting: Family Medicine

## 2022-08-31 LAB — LP+NON-HDL CHOLESTEROL
Cholesterol, Total: 235 mg/dL — ABNORMAL HIGH (ref 100–199)
HDL: 41 mg/dL (ref >39)
LDL Chol Calc (NIH): 169 mg/dL — ABNORMAL HIGH (ref 0–99)
Total Non-HDL-Chol (LDL+VLDL): 194 mg/dL — ABNORMAL HIGH (ref 0–129)
Triglycerides: 137 mg/dL (ref 0–149)
VLDL Cholesterol Cal: 25 mg/dL (ref 5–40)

## 2022-08-31 MED ORDER — ATORVASTATIN CALCIUM 20 MG PO TABS: 20.0000 mg | ORAL_TABLET | Freq: Every day | ORAL | 6 refills | Status: DC

## 2022-09-03 ENCOUNTER — Encounter: Payer: Self-pay | Admitting: Nurse Practitioner

## 2022-09-03 ENCOUNTER — Ambulatory Visit (INDEPENDENT_AMBULATORY_CARE_PROVIDER_SITE_OTHER): Payer: Commercial Managed Care - HMO | Admitting: Nurse Practitioner

## 2022-09-03 ENCOUNTER — Other Ambulatory Visit (HOSPITAL_COMMUNITY)
Admission: RE | Admit: 2022-09-03 | Discharge: 2022-09-03 | Disposition: A | Discharge status: Home / Self Care | Payer: Commercial Managed Care - HMO | Source: Ambulatory Visit | Attending: Nurse Practitioner | Admitting: Nurse Practitioner

## 2022-09-03 VITALS — BP 142/82 | HR 99 | Ht 66.5 in | Wt 393.0 lb

## 2022-09-03 DIAGNOSIS — A630 Anogenital (venereal) warts: Secondary | ICD-10-CM

## 2022-09-03 DIAGNOSIS — Z01419 Encounter for gynecological examination (general) (routine) without abnormal findings: Secondary | ICD-10-CM | POA: Diagnosis present

## 2022-09-03 DIAGNOSIS — Z3046 Encounter for surveillance of implantable subdermal contraceptive: Secondary | ICD-10-CM | POA: Diagnosis not present
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
## 2022-09-06 ENCOUNTER — Ambulatory Visit: Payer: Commercial Managed Care - HMO | Admitting: Nurse Practitioner

## 2022-09-06 LAB — CYTOLOGY - PAP: Diagnosis: NEGATIVE

## 2022-09-14 NOTE — Progress Notes (Signed)
Chief Complaint:   OBESITY Chloe Pena is here to discuss her progress with her obesity treatment plan along with follow-up of her obesity related diagnoses. Chloe Pena is on keeping a food journal and adhering to recommended goals of 1200-1800 calories and 120+ grams protein and states she is following her eating plan approximately 0% of the time. Chloe Pena states she is doing chores 4 hours 7 times per week.  Today's visit was #: 25 Starting weight: 349 lbs Starting date: 06/15/2021 Today's weight: 387 lbs Today's date: 08/27/2022 Total lbs lost to date: 0 Total lbs lost since last in-office visit: +3  Interim History: Pt's oven broke 2 weeks ago, and she has been using pressure cooker and gas stovetop to cook. Her calories are ranging [phone] and protein 50-134 grams. Pt is wondering about Nexplanon and whether this is a contributing factor to weight gain. She is trying to stay mindful of choices. She is switching insurance to [employer]. Pt mentioned she is getting 2 protein shakes per day (totaling about 60 grams, if she does).  Subjective:   1. Mixed hyperlipidemia Chloe Pena has an LDL of 181, HDL 38, and triglycerides 114. She is on Zetia.  2. Vitamin D deficiency Pt is on Cholecalciferol weekly and has a Vit D level of 26.8.  3. Prediabetes She is on Trulicity 1.5 mg weekly and has some pain with injection. Her last A1c was 5.8.  Assessment/Plan:   1. Mixed hyperlipidemia Cardiovascular risk and specific lipid/LDL goals reviewed.  We discussed several lifestyle modifications today and Chloe Pena will continue to work on diet, exercise and weight loss efforts. Orders and follow up as documented in patient record.   Counseling Intensive lifestyle modifications are the first line treatment for this issue. Dietary changes: Increase soluble fiber. Decrease simple carbohydrates. Exercise changes: Moderate to vigorous-intensity aerobic activity 150 minutes per week if tolerated. Lipid-lowering  medications: see documented in medical record.  Refill- ezetimibe (ZETIA) 10 MG tablet; Take 1 tablet (10 mg total) by mouth daily.  Dispense: 90 tablet; Refill: 0  2. Vitamin D deficiency Low Vitamin D level contributes to fatigue and are associated with obesity, breast, and colon cancer. She agrees to continue to take prescription Vitamin D 50,000 IU every week and will follow-up for routine testing of Vitamin D, at least 2-3 times per year to avoid over-replacement.  Refill- Cholecalciferol 1.25 MG (50000 UT) TABS; Take 1 tablet by mouth once a week.  Dispense: 4 tablet; Refill: 0  - VITAMIN D 25 Hydroxy (Vit-D Deficiency, Fractures)  3. Prediabetes Chloe Pena will continue to work on weight loss, exercise, and decreasing simple carbohydrates to help decrease the risk of diabetes.  Needs prior authorization at next appt with new insurance.  Refill- Dulaglutide (TRULICITY) 1.5 GG/8.3MO SOPN; Inject 1.5 mg into the skin once a week.  Dispense: 2 mL; Refill: 0 - Insulin, random  4. Obesity with current BMI of 64.5 Chloe Pena is currently in the action stage of change. As such, her goal is to continue with weight loss efforts. She has agreed to keeping a food journal and adhering to recommended goals of 1700-180 calories and 120+ grams protein.   Exercise goals:  As is  Behavioral modification strategies: increasing lean protein intake, meal planning and cooking strategies, keeping healthy foods in the home, and planning for success.  Chloe Pena has agreed to follow-up with our clinic in 4 weeks. She was informed of the importance of frequent follow-up visits to maximize her success with intensive lifestyle modifications  for her multiple health conditions.   Objective:   Blood pressure 135/81, pulse 78, temperature 99.1 F (37.3 C), height '5\' 5"'$  (1.651 m), weight (!) 387 lb (175.5 kg), SpO2 97 %. Body mass index is 64.4 kg/m.  General: Cooperative, alert, well developed, in no acute  distress. HEENT: Conjunctivae and lids unremarkable. Cardiovascular: Regular rhythm.  Lungs: Normal work of breathing. Neurologic: No focal deficits.   Lab Results  Component Value Date   CREATININE 0.80 05/28/2022   BUN 10 05/28/2022   NA 138 05/28/2022   K 4.7 05/28/2022   CL 102 05/28/2022   CO2 22 05/28/2022   Lab Results  Component Value Date   ALT 34 (H) 05/28/2022   AST 24 05/28/2022   ALKPHOS 122 (H) 05/28/2022   BILITOT 0.9 05/28/2022   Lab Results  Component Value Date   HGBA1C 5.8 (H) 05/28/2022   HGBA1C 5.5 06/22/2021   HGBA1C 5.6 03/13/2021   HGBA1C 5.6 04/06/2020   HGBA1C 5.2 12/30/2017   Lab Results  Component Value Date   INSULIN 91.7 (H) 05/28/2022   INSULIN 44.1 (H) 06/22/2021   Lab Results  Component Value Date   TSH 2.200 06/22/2021   Lab Results  Component Value Date   CHOL 235 (H) 08/30/2022   HDL 41 08/30/2022   LDLCALC 169 (H) 08/30/2022   TRIG 137 08/30/2022   CHOLHDL 7.3 04/06/2020   Lab Results  Component Value Date   VD25OH 26.8 (L) 05/28/2022   VD25OH 24.4 (L) 06/22/2021   VD25OH 29 (L) 02/10/2014   Lab Results  Component Value Date   WBC 10.8 02/14/2022   HGB 13.6 02/14/2022   HCT 39.8 02/14/2022   MCV 88 02/14/2022   PLT 326 02/14/2022   Attestation Statements:   Reviewed by clinician on day of visit: allergies, medications, problem list, medical history, surgical history, family history, social history, and previous encounter notes.  I, Kathlene November, BS, CMA, am acting as transcriptionist for Coralie Common, MD.   I have reviewed the above documentation for accuracy and completeness, and I agree with the above. - Coralie Common, MD

## 2022-09-26 ENCOUNTER — Encounter (INDEPENDENT_AMBULATORY_CARE_PROVIDER_SITE_OTHER): Payer: Self-pay | Admitting: Family Medicine

## 2022-09-26 ENCOUNTER — Ambulatory Visit (INDEPENDENT_AMBULATORY_CARE_PROVIDER_SITE_OTHER): Payer: 59 | Admitting: Family Medicine

## 2022-09-26 VITALS — BP 124/81 | HR 75 | Temp 98.8°F | Ht 65.0 in | Wt 391.0 lb

## 2022-09-26 DIAGNOSIS — E559 Vitamin D deficiency, unspecified: Secondary | ICD-10-CM | POA: Diagnosis not present

## 2022-09-26 DIAGNOSIS — R7303 Prediabetes: Secondary | ICD-10-CM | POA: Diagnosis not present

## 2022-09-26 DIAGNOSIS — E669 Obesity, unspecified: Secondary | ICD-10-CM | POA: Diagnosis not present

## 2022-09-26 DIAGNOSIS — Z6841 Body Mass Index (BMI) 40.0 and over, adult: Secondary | ICD-10-CM

## 2022-09-26 MED ORDER — CHOLECALCIFEROL 1.25 MG (50000 UT) PO TABS: 1.0000 | ORAL_TABLET | ORAL | 0 refills | Status: DC

## 2022-09-26 MED ORDER — TRULICITY 1.5 MG/0.5ML ~~LOC~~ SOAJ: 1.5000 mg | SUBCUTANEOUS | 0 refills | Status: DC

## 2022-10-02 ENCOUNTER — Ambulatory Visit (INDEPENDENT_AMBULATORY_CARE_PROVIDER_SITE_OTHER): Payer: 59 | Admitting: Nurse Practitioner

## 2022-10-02 VITALS — BP 124/82 | HR 87

## 2022-10-02 DIAGNOSIS — Z3046 Encounter for surveillance of implantable subdermal contraceptive: Secondary | ICD-10-CM

## 2022-10-02 NOTE — Progress Notes (Signed)
27 y.o. G0P0000  presents for Nexplanon removal.  She has been experiencing weight gain.  She has decided to use none for future contraception. She is abstinent. Procedure, risks and benefits have all been explained.  She has the following questions today:  None.     LMP:  UNK   After all questions were answered, consent was obtained.    Past Medical History:  Diagnosis Date   Anxiety    no meds   Asthma    as a child, no inhaler, no problems as adult   Depression    Fatty liver    Migraine    otc med prn   PCOS (polycystic ovarian syndrome)     Past Surgical History:  Procedure Laterality Date   LAPAROSCOPIC OVARIAN CYSTECTOMY N/A 02/11/2018   Procedure: LAPAROSCOPIC OVARIAN CYSTECTOMY;  Surgeon: Sloan Leiter, MD;  Location: [facility];  Service: Gynecology;  Laterality: N/A;   WISDOM TOOTH EXTRACTION      Current Outpatient Medications on File Prior to Visit  Medication Sig Dispense Refill   atorvastatin (LIPITOR) 20 MG tablet Take 1 tablet (20 mg total) by mouth daily. 30 tablet 6   buPROPion (WELLBUTRIN XL) 150 MG 24 hr tablet Take 1 tablet (150 mg total) by mouth daily. 90 tablet 1   Cholecalciferol 1.25 MG (50000 UT) TABS Take 1 tablet by mouth once a week. 4 tablet 0   Dulaglutide (TRULICITY) 1.5 VE/9.3YB SOPN Inject 1.5 mg into the skin once a week. 2 mL 0   etonogestrel (NEXPLANON) 68 MG IMPL implant 1 each by Subdermal route once.     ezetimibe (ZETIA) 10 MG tablet Take 1 tablet (10 mg total) by mouth daily. 90 tablet 0   metFORMIN (GLUCOPHAGE) 500 MG tablet Take 500 mg by mouth 2 (two) times daily.     sertraline (ZOLOFT) 100 MG tablet TAKE 1 TABLET (100 MG TOTAL) BY MOUTH DAILY (AM) 90 tablet 1   silver sulfADIAZINE (SILVADENE) 1 % cream Apply 1 Application topically 2 (two) times daily. 50 g 0   topiramate (TOPAMAX) 50 MG tablet TAKE 1 TABLET (25 MG TOTAL) BY MOUTH DAILY. (AM) 90 tablet 1   triamcinolone cream (KENALOG) 0.1 % Apply 1 application  topically 2 (two)  times daily. 45 g 1   Vitamin D, Ergocalciferol, (DRISDOL) 1.25 MG (50000 UNIT) CAPS capsule Take 50,000 Units by mouth once a week.     No current facility-administered medications on file prior to visit.   No Known Allergies  Vitals:   10/02/22 1022  BP: 124/82  Pulse: 87  SpO2: 96%   Physical Exam  Procedure: Patient placed supine on exam table with her right arm flexed at the elbow. The prior insertion site was located and the Nexplanon rod was palpated.  Area cleansed with Betadine x 3 and draped in normal sterile fashion.  Insertion site and surrounding tissue anesthetized with 1% Lidocaine without epinephrine, 1.5 cc total used.  Small incision made with #11 blade. Difficulty with removal. Jami C, NP came in to assist. Rod removed. Steri-strips were applied and pressure dressing placed over the site.  Entire procedure performed with sterile technique.  Pt tolerated procedure well.  Assessment: Nexplanon removal  Plan:  Post procedure instructions reviewed with pt.  Questions answered.  Pt knows to call with any concerns or questions.  New contraception:  None
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
## 2022-10-11 ENCOUNTER — Ambulatory Visit: Payer: 59 | Admitting: Nurse Practitioner

## 2022-10-11 VITALS — BP 128/84

## 2022-10-11 DIAGNOSIS — L989 Disorder of the skin and subcutaneous tissue, unspecified: Secondary | ICD-10-CM

## 2022-10-11 DIAGNOSIS — R69 Illness, unspecified: Secondary | ICD-10-CM | POA: Diagnosis not present

## 2022-10-11 DIAGNOSIS — A630 Anogenital (venereal) warts: Secondary | ICD-10-CM | POA: Diagnosis not present

## 2022-10-11 NOTE — Progress Notes (Signed)
   Acute Office Visit  Subjective:    Patient ID: Chloe Pena, female    DOB: Jan 14, 1996, 27 y.o.   MRN: [id number]   HPI 27 y.o. presents today for removal of genital warts. Multiple seen on last exam along top of vulva near clitoral hood. They are bothersome to her.    Review of Systems  Constitutional: Negative.   Genitourinary:  Positive for genital sores.       Objective:    Physical Exam Constitutional:      Appearance: Normal appearance.  Genitourinary:      BP 128/84 (BP Location: Right Wrist, Patient Position: Sitting, Cuff Size: Normal)  Wt Readings from Last 3 Encounters:  09/26/22 (!) 391 lb (177.4 kg)  09/03/22 (!) 393 lb (178.3 kg)  08/29/22 (!) 391 lb 6.4 oz (177.5 kg)        Patient informed chaperone available to be present for breast and/or pelvic exam. Patient has requested no chaperone to be present. Patient has been advised what will be completed during breast and pelvic exam.   Assessment & Plan:   Problem List Items Addressed This Visit   None Visit Diagnoses     Condyloma acuminata    -  Primary   Encounter for removal of skin lesion          Plan: Verbal consent obtained. Petroleum gel applied to surrounding area for protection, cryotherapy performed, patient tolerated procedure well. After care instructions given, along with triple antibiotic ointment if needed. All questions answered.      Tamela Gammon DNP, 2:20 PM 10/11/2022

## 2022-10-25 ENCOUNTER — Ambulatory Visit (INDEPENDENT_AMBULATORY_CARE_PROVIDER_SITE_OTHER): Payer: 59 | Admitting: Family Medicine

## 2022-10-25 ENCOUNTER — Encounter (INDEPENDENT_AMBULATORY_CARE_PROVIDER_SITE_OTHER): Payer: Self-pay | Admitting: Family Medicine

## 2022-10-25 VITALS — BP 124/78 | HR 85 | Temp 99.1°F | Ht 65.0 in | Wt 393.0 lb

## 2022-10-25 DIAGNOSIS — E559 Vitamin D deficiency, unspecified: Secondary | ICD-10-CM | POA: Diagnosis not present

## 2022-10-25 DIAGNOSIS — R632 Polyphagia: Secondary | ICD-10-CM | POA: Diagnosis not present

## 2022-10-25 DIAGNOSIS — Z6841 Body Mass Index (BMI) 40.0 and over, adult: Secondary | ICD-10-CM | POA: Diagnosis not present

## 2022-10-25 DIAGNOSIS — E669 Obesity, unspecified: Secondary | ICD-10-CM | POA: Diagnosis not present

## 2022-10-25 MED ORDER — LOMAIRA 8 MG PO TABS: 8.0000 mg | ORAL_TABLET | Freq: Every day | ORAL | 0 refills | Status: DC

## 2022-10-25 MED ORDER — CHOLECALCIFEROL 1.25 MG (50000 UT) PO TABS: 1.0000 | ORAL_TABLET | ORAL | 0 refills | Status: DC

## 2022-10-25 NOTE — Progress Notes (Deleted)
Patient has had a good few weeks.  Fridge, dryer and vacuum all broke at the same time.  That is where all her student money went.  She was able to get an allergen vacuum.  Waiting for dryer to be fixed.  Going to go to [employer] after today's appointment to load up on groceries.  Anticipates using some of her tax money to get brisket and ground beef to make bacon and sausage.  Insurance has refused to play for trulicity.  She is already on topiramate '50mg'$  daily.

## 2022-11-08 ENCOUNTER — Telehealth (INDEPENDENT_AMBULATORY_CARE_PROVIDER_SITE_OTHER): Payer: Self-pay | Admitting: Family Medicine

## 2022-11-08 ENCOUNTER — Other Ambulatory Visit (INDEPENDENT_AMBULATORY_CARE_PROVIDER_SITE_OTHER): Payer: Self-pay | Admitting: Family Medicine

## 2022-11-08 DIAGNOSIS — R632 Polyphagia: Secondary | ICD-10-CM

## 2022-11-08 NOTE — Telephone Encounter (Signed)
2/22 Patient want Phentermine sent to  [facility] health outpatient  [facility].

## 2022-11-08 NOTE — Progress Notes (Signed)
Chief Complaint:   OBESITY Chloe Pena is here to discuss her progress with her obesity treatment plan along with follow-up of her obesity related diagnoses. Chloe Pena is on keeping a food journal and adhering to recommended goals of 1700-1800 calories and 120+ grams of protein and states she is following her eating plan approximately 90% of the time. Chloe Pena states she is housework/lots of working.  Today's visit was #: 21 Starting weight: 349 lbs Starting date: 06/15/2021 Today's weight: 393 lbs Today's date: 10/25/2022 Total lbs lost to date: 0 Total lbs lost since last in-office visit: 0  Interim History: Chloe Pena has had a good few weeks.  Fridge, dryer and vacuum all broke at the same time.  That is where all her student money went.  She was able to get an allergen vacuum.  Waiting for dryer to be fixed.  Going to go to [employer] after today's appointment to load up on groceries.  Anticipates using some of her tax money to get brisket and ground beef to make bacon and sausage.  Insurance has refused to play for trulicity.  She is already on topiramate '50mg'$  daily.   Subjective:   1. Polyphagia On Trulicity but this is no longer going to be covered already on Topiramate.  PDMP checked controlled substance contract signed.  2. Vitamin D deficiency Chloe Pena is currently taking prescription Vit D 50,000 IU once a week.    Assessment/Plan:   1. Polyphagia Will Start Lomaira 8 mg daily for 1 month with 0 refills.  -Start Phentermine HCl (LOMAIRA) 8 MG TABS; Take 1 tablet (8 mg total) by mouth daily.  Dispense: 30 tablet; Refill: 0  2. Vitamin D deficiency We will refill Vit D 50K IU once a week for 1 month with 0 refills.  -Refill Cholecalciferol 1.25 MG (50000 UT) TABS; Take 1 tablet by mouth once a week.  Dispense: 4 tablet; Refill: 0  3. BMI 60.0-69.9, adult (Chloe Pena)  4. Obesity with current BMI of 65.4 Chloe Pena is currently in the action stage of change. As such, her goal is to continue with  weight loss efforts. She has agreed to keeping a food journal and adhering to recommended goals of 1700-1800 calories and 125+ grams of protein daily.   Exercise goals: All adults should avoid inactivity. Some physical activity is better than none, and adults who participate in any amount of physical activity gain some health benefits.  Behavioral modification strategies: increasing lean protein intake, meal planning and cooking strategies, keeping healthy foods in the home, and planning for success.  Chloe Pena has agreed to follow-up with our clinic in 4 weeks. She was informed of the importance of frequent follow-up visits to maximize her success with intensive lifestyle modifications for her multiple health conditions.   Objective:   Blood pressure 124/78, pulse 85, temperature 99.1 F (37.3 C), height '5\' 5"'$  (1.651 m), weight (!) 393 lb (178.3 kg), SpO2 98 %. Body mass index is 65.4 kg/m.  General: Cooperative, alert, well developed, in no acute distress. HEENT: Conjunctivae and lids unremarkable. Cardiovascular: Regular rhythm.  Lungs: Normal work of breathing. Neurologic: No focal deficits.   Lab Results  Component Value Date   CREATININE 0.80 05/28/2022   BUN 10 05/28/2022   NA 138 05/28/2022   K 4.7 05/28/2022   CL 102 05/28/2022   CO2 22 05/28/2022   Lab Results  Component Value Date   ALT 34 (H) 05/28/2022   AST 24 05/28/2022   ALKPHOS 122 (H) 05/28/2022  BILITOT 0.9 05/28/2022   Lab Results  Component Value Date   HGBA1C 5.8 (H) 05/28/2022   HGBA1C 5.5 06/22/2021   HGBA1C 5.6 03/13/2021   HGBA1C 5.6 04/06/2020   HGBA1C 5.2 12/30/2017   Lab Results  Component Value Date   INSULIN 91.7 (H) 05/28/2022   INSULIN 44.1 (H) 06/22/2021   Lab Results  Component Value Date   TSH 2.200 06/22/2021   Lab Results  Component Value Date   CHOL 235 (H) 08/30/2022   HDL 41 08/30/2022   LDLCALC 169 (H) 08/30/2022   TRIG 137 08/30/2022   CHOLHDL 7.3 04/06/2020   Lab  Results  Component Value Date   VD25OH 26.8 (L) 05/28/2022   VD25OH 24.4 (L) 06/22/2021   VD25OH 29 (L) 02/10/2014   Lab Results  Component Value Date   WBC 10.8 02/14/2022   HGB 13.6 02/14/2022   HCT 39.8 02/14/2022   MCV 88 02/14/2022   PLT 326 02/14/2022   No results found for: "IRON", "TIBC", "FERRITIN"  Attestation Statements:   Reviewed by clinician on day of visit: allergies, medications, problem list, medical history, surgical history, family history, social history, and previous encounter notes.  I, Elnora Morrison, RMA am acting as transcriptionist for Coralie Common, MD.  I have reviewed the above documentation for accuracy and completeness, and I agree with the above. - Coralie Common, MD

## 2022-11-08 NOTE — Telephone Encounter (Signed)
Spoke w/ pt, Dr Jeani Sow will send Rx to [facility] on [address]

## 2022-11-12 ENCOUNTER — Other Ambulatory Visit (INDEPENDENT_AMBULATORY_CARE_PROVIDER_SITE_OTHER): Payer: Self-pay | Admitting: Family Medicine

## 2022-11-12 ENCOUNTER — Other Ambulatory Visit: Payer: Self-pay | Admitting: Family Medicine

## 2022-11-12 DIAGNOSIS — F419 Anxiety disorder, unspecified: Secondary | ICD-10-CM

## 2022-11-12 DIAGNOSIS — E782 Mixed hyperlipidemia: Secondary | ICD-10-CM

## 2022-11-12 DIAGNOSIS — G43709 Chronic migraine without aura, not intractable, without status migrainosus: Secondary | ICD-10-CM

## 2022-11-12 MED ORDER — TOPIRAMATE 50 MG PO TABS: ORAL_TABLET | ORAL | 1 refills | Status: DC

## 2022-11-22 ENCOUNTER — Ambulatory Visit (INDEPENDENT_AMBULATORY_CARE_PROVIDER_SITE_OTHER): Payer: 59 | Admitting: Family Medicine

## 2022-11-22 ENCOUNTER — Encounter (INDEPENDENT_AMBULATORY_CARE_PROVIDER_SITE_OTHER): Payer: Self-pay | Admitting: Family Medicine

## 2022-11-22 VITALS — BP 133/84 | HR 87 | Temp 98.9°F | Ht 65.0 in | Wt >= 6400 oz

## 2022-11-22 DIAGNOSIS — R632 Polyphagia: Secondary | ICD-10-CM

## 2022-11-22 DIAGNOSIS — E559 Vitamin D deficiency, unspecified: Secondary | ICD-10-CM | POA: Diagnosis not present

## 2022-11-22 DIAGNOSIS — Z6841 Body Mass Index (BMI) 40.0 and over, adult: Secondary | ICD-10-CM

## 2022-11-22 DIAGNOSIS — E669 Obesity, unspecified: Secondary | ICD-10-CM | POA: Diagnosis not present

## 2022-11-22 MED ORDER — LOMAIRA 8 MG PO TABS: 8.0000 mg | ORAL_TABLET | Freq: Every day | ORAL | 0 refills | Status: DC

## 2022-11-22 MED ORDER — VITAMIN D (ERGOCALCIFEROL) 1.25 MG (50000 UNIT) PO CAPS: 50000.0000 [IU] | ORAL_CAPSULE | ORAL | 0 refills | Status: DC
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
## 2022-12-17 ENCOUNTER — Other Ambulatory Visit (INDEPENDENT_AMBULATORY_CARE_PROVIDER_SITE_OTHER): Payer: Self-pay | Admitting: Family Medicine

## 2022-12-17 DIAGNOSIS — E559 Vitamin D deficiency, unspecified: Secondary | ICD-10-CM

## 2022-12-17 DIAGNOSIS — E782 Mixed hyperlipidemia: Secondary | ICD-10-CM

## 2022-12-20 ENCOUNTER — Ambulatory Visit (INDEPENDENT_AMBULATORY_CARE_PROVIDER_SITE_OTHER): Payer: 59 | Admitting: Family Medicine

## 2022-12-20 ENCOUNTER — Encounter (INDEPENDENT_AMBULATORY_CARE_PROVIDER_SITE_OTHER): Payer: Self-pay | Admitting: Family Medicine

## 2022-12-20 VITALS — BP 124/78 | HR 98 | Temp 98.6°F | Ht 65.0 in | Wt >= 6400 oz

## 2022-12-20 DIAGNOSIS — E669 Obesity, unspecified: Secondary | ICD-10-CM | POA: Diagnosis not present

## 2022-12-20 DIAGNOSIS — E559 Vitamin D deficiency, unspecified: Secondary | ICD-10-CM

## 2022-12-20 DIAGNOSIS — Z6841 Body Mass Index (BMI) 40.0 and over, adult: Secondary | ICD-10-CM

## 2022-12-20 DIAGNOSIS — E782 Mixed hyperlipidemia: Secondary | ICD-10-CM

## 2022-12-20 MED ORDER — VITAMIN D (ERGOCALCIFEROL) 1.25 MG (50000 UNIT) PO CAPS: 50000.0000 [IU] | ORAL_CAPSULE | ORAL | 0 refills | Status: DC

## 2022-12-20 MED ORDER — LOMAIRA 8 MG PO TABS: 8.0000 mg | ORAL_TABLET | Freq: Every day | ORAL | 0 refills | Status: DC

## 2022-12-20 MED ORDER — EZETIMIBE 10 MG PO TABS: 10.0000 mg | ORAL_TABLET | Freq: Every day | ORAL | 0 refills | Status: DC

## 2022-12-21 DIAGNOSIS — Z5948 Other specified lack of adequate food: Secondary | ICD-10-CM | POA: Diagnosis not present

## 2022-12-21 DIAGNOSIS — E559 Vitamin D deficiency, unspecified: Secondary | ICD-10-CM | POA: Diagnosis not present

## 2022-12-21 DIAGNOSIS — Z5986 Financial insecurity: Secondary | ICD-10-CM | POA: Diagnosis not present

## 2022-12-21 DIAGNOSIS — E785 Hyperlipidemia, unspecified: Secondary | ICD-10-CM | POA: Diagnosis not present

## 2022-12-21 DIAGNOSIS — Z6841 Body Mass Index (BMI) 40.0 and over, adult: Secondary | ICD-10-CM | POA: Diagnosis not present

## 2022-12-21 DIAGNOSIS — G43909 Migraine, unspecified, not intractable, without status migrainosus: Secondary | ICD-10-CM | POA: Diagnosis not present

## 2022-12-21 DIAGNOSIS — F321 Major depressive disorder, single episode, moderate: Secondary | ICD-10-CM | POA: Diagnosis not present

## 2022-12-21 DIAGNOSIS — Z5982 Transportation insecurity: Secondary | ICD-10-CM | POA: Diagnosis not present

## 2022-12-21 DIAGNOSIS — Z993 Dependence on wheelchair: Secondary | ICD-10-CM | POA: Diagnosis not present

## 2022-12-21 DIAGNOSIS — R030 Elevated blood-pressure reading, without diagnosis of hypertension: Secondary | ICD-10-CM | POA: Diagnosis not present

## 2022-12-24 NOTE — Progress Notes (Unsigned)
Chief Complaint:   OBESITY Chloe Pena is here to discuss her progress with her obesity treatment plan along with follow-up of her obesity related diagnoses. Chloe Pena is on keeping a food journal and adhering to recommended goals of 1700-1800 calories and 125+ grams of protein and states she is following her eating plan approximately 80% of the time. Chloe Pena states she is doing physical therapy and walking for 90 minutes 5 times per week.  Today's visit was #: 23 Starting weight: 349 lbs Starting date: 06/15/2021 Today's weight: 403 lbs Today's date: 12/20/2022 Total lbs lost to date: 0 Total lbs lost since last in-office visit: 1  Interim History: Chloe Pena is working on portion control and Contract[employer].  She is trying to not miss meals.  She is not having problems with phentermine, and her blood pressure is stable.  She feels she is not eating all of her calories.  Subjective:   1. Mixed hyperlipidemia Chloe Pena is on Zetia, and she is working on her diet.  No side effects were noted.  2. Vitamin D deficiency Chloe Pena is on vitamin D, and she denies nausea, vomiting, or muscle weakness but she notes fatigue.  Assessment/Plan:   1. Mixed hyperlipidemia Chloe Pena will continue Zetia 10 mg once daily, and we will refill for 90 days.  - ezetimibe (ZETIA) 10 MG tablet; Take 1 tablet (10 mg total) by mouth daily.  Dispense: 90 tablet; Refill: 0  2. Vitamin D deficiency Chloe Pena will continue prescription vitamin D, and we will refill for 1 month.  - Vitamin D, Ergocalciferol, (DRISDOL) 1.25 MG (50000 UNIT) CAPS capsule; Take 1 capsule (50,000 Units total) by mouth once a week.  Dispense: 5 capsule; Refill: 0  3. BMI 67 - Phentermine HCl (LOMAIRA) 8 MG TABS; Take 1 tablet (8 mg total) by mouth daily.  Dispense: 30 tablet; Refill: 0  4. Obesity with starting BMI of 58.0 We will refill the [facility] for 1 month. Chloe Pena is to follow-up with Dr. Lawson Radar.   - Phentermine HCl (LOMAIRA) 8 MG TABS; Take 1  tablet (8 mg total) by mouth daily.  Dispense: 30 tablet; Refill: 0  Chloe Pena is currently in the action stage of change. As such, her goal is to continue with weight loss efforts. She has agreed to practicing portion control and making smarter food choices, such as increasing vegetables and decreasing simple carbohydrates.   Exercise goals: As is.   Behavioral modification strategies: increasing lean protein intake.  Chloe Pena has agreed to follow-up with our clinic in 3 to 4 weeks. She was informed of the importance of frequent follow-up visits to maximize her success with intensive lifestyle modifications for her multiple health conditions.   Objective:   Blood pressure 124/78, pulse 98, temperature 98.6 F (37 C), height 5\' 5"  (1.651 m), weight (!) 403 lb (182.8 kg), SpO2 95 %. Body mass index is 67.06 kg/m.  Lab Results  Component Value Date   CREATININE 0.80 05/28/2022   BUN 10 05/28/2022   NA 138 05/28/2022   K 4.7 05/28/2022   CL 102 05/28/2022   CO2 22 05/28/2022   Lab Results  Component Value Date   ALT 34 (H) 05/28/2022   AST 24 05/28/2022   ALKPHOS 122 (H) 05/28/2022   BILITOT 0.9 05/28/2022   Lab Results  Component Value Date   HGBA1C 5.8 (H) 05/28/2022   HGBA1C 5.5 06/22/2021   HGBA1C 5.6 03/13/2021   HGBA1C 5.6 04/06/2020   HGBA1C 5.2 12/30/2017   Lab  Results  Component Value Date   INSULIN 91.7 (H) 05/28/2022   INSULIN 44.1 (H) 06/22/2021   Lab Results  Component Value Date   TSH 2.200 06/22/2021   Lab Results  Component Value Date   CHOL 235 (H) 08/30/2022   HDL 41 08/30/2022   LDLCALC 169 (H) 08/30/2022   TRIG 137 08/30/2022   CHOLHDL 7.3 04/06/2020   Lab Results  Component Value Date   VD25OH 26.8 (L) 05/28/2022   VD25OH 24.4 (L) 06/22/2021   VD25OH 29 (L) 02/10/2014   Lab Results  Component Value Date   WBC 10.8 02/14/2022   HGB 13.6 02/14/2022   HCT 39.8 02/14/2022   MCV 88 02/14/2022   PLT 326 02/14/2022   No results found for:  "IRON", "TIBC", "FERRITIN"  Attestation Statements:   Reviewed by clinician on day of visit: allergies, medications, problem list, medical history, surgical history, family history, social history, and previous encounter notes.  I have personally spent 47 minutes total time today in preparation, patient care, and documentation for this visit, including the following: review of clinical lab tests; review of medical tests/procedures/services.   I, Burt Knack, am acting as transcriptionist for Quillian Quince, MD.  I have reviewed the above documentation for accuracy and completeness, and I agree with the above. -  Quillian Quince, MD

## 2023-01-17 ENCOUNTER — Encounter (INDEPENDENT_AMBULATORY_CARE_PROVIDER_SITE_OTHER): Payer: Self-pay | Admitting: Family Medicine

## 2023-01-17 ENCOUNTER — Ambulatory Visit (INDEPENDENT_AMBULATORY_CARE_PROVIDER_SITE_OTHER): Payer: 59 | Admitting: Family Medicine

## 2023-01-17 VITALS — BP 125/84 | HR 104 | Temp 98.9°F | Ht 65.0 in | Wt >= 6400 oz

## 2023-01-17 DIAGNOSIS — Z6841 Body Mass Index (BMI) 40.0 and over, adult: Secondary | ICD-10-CM | POA: Diagnosis not present

## 2023-01-17 DIAGNOSIS — E669 Obesity, unspecified: Secondary | ICD-10-CM

## 2023-01-17 DIAGNOSIS — E7849 Other hyperlipidemia: Secondary | ICD-10-CM

## 2023-01-17 DIAGNOSIS — R632 Polyphagia: Secondary | ICD-10-CM

## 2023-01-17 NOTE — Progress Notes (Signed)
Chief Complaint:   OBESITY Chloe Pena is here to discuss her progress with her obesity treatment plan along with follow-up of her obesity related diagnoses. Chloe Pena is on practicing portion control and making smarter food choices, such as increasing vegetables and decreasing simple carbohydrates and states she is following her eating plan approximately 80% of the time. Chloe Pena states she is walking more.  Today's visit was #: 24 Starting weight: 349 lbs Starting date: 06/15/2021 Today's weight: 409 lbs Today's date: 01/17/2023 Total lbs lost to date: 0 Total lbs lost since last in-office visit: +6 lbs  Interim History: Patient feels like she has really working toward getting more food in daily.  She is trying to be mindful of focusing on protein intake and getting in a more significant amount of calories in.  She has tried a few different anti obesity medicines and unfortunately we have yet to see any change on the scale.  She has made significant changes to her dietary intake and the scale unfortunately hasn't decreased.  Subjective:   1. Polyphagia Patient is on combination phentermine/topiramate now.  Patient started on 11/22/2022 at 404 LBS. (Not at treatment dose yet).  PDMP checked, no concerns.  Necessary weight loss for 5% to continue would be 20lbs- weight of 384.  2. Other hyperlipidemia Patient is not on any medications.  Last LDL was 169.  Assessment/Plan:   1. Polyphagia Stopped Lomaira/topiramate.  Needed to lose 25lbs in 1 month which is unlikely will stop medications.  Discussed that given insurance will likely not cover any other medication to assist with weight loss the discussion of bariatric surgery ensued.  We discussed various surgical options along with benefits and long term follow up needed. Patient will reach out to [facility].  2. Other hyperlipidemia Repeat labs with ICS.  3. BMI 60.0-69.9, adult (HCC)  4. Obesity with starting BMI of  58.0 Chloe Pena is currently in the action stage of change. As such, her goal is to continue with weight loss efforts. She has agreed to practicing portion control and making smarter food choices, such as increasing vegetables and decreasing simple carbohydrates.   Exercise goals: All adults should avoid inactivity. Some physical activity is better than none, and adults who participate in any amount of physical activity gain some health benefits.  Behavioral modification strategies: increasing lean protein intake, meal planning and cooking strategies, keeping healthy foods in the home, and planning for success.  Chloe Pena has agreed to follow-up with our clinic in 4 weeks. She was informed of the importance of frequent follow-up visits to maximize her success with intensive lifestyle modifications for her multiple health conditions.   Objective:   Blood pressure 125/84, pulse (!) 104, temperature 98.9 F (37.2 C), height 5\' 5"  (1.651 m), weight (!) 409 lb (185.5 kg), SpO2 96 %. Body mass index is 68.06 kg/m.  General: Cooperative, alert, well developed, in no acute distress. HEENT: Conjunctivae and lids unremarkable. Cardiovascular: Regular rhythm.  Lungs: Normal work of breathing. Neurologic: No focal deficits.   Lab Results  Component Value Date   CREATININE 0.80 05/28/2022   BUN 10 05/28/2022   NA 138 05/28/2022   K 4.7 05/28/2022   CL 102 05/28/2022   CO2 22 05/28/2022   Lab Results  Component Value Date   ALT 34 (H) 05/28/2022   AST 24 05/28/2022   ALKPHOS 122 (H) 05/28/2022   BILITOT 0.9 05/28/2022   Lab Results  Component Value Date   HGBA1C 5.8 (H)  05/28/2022   HGBA1C 5.5 06/22/2021   HGBA1C 5.6 03/13/2021   HGBA1C 5.6 04/06/2020   HGBA1C 5.2 12/30/2017   Lab Results  Component Value Date   INSULIN 91.7 (H) 05/28/2022   INSULIN 44.1 (H) 06/22/2021   Lab Results  Component Value Date   TSH 2.200 06/22/2021   Lab Results  Component Value Date   CHOL 235 (H)  08/30/2022   HDL 41 08/30/2022   LDLCALC 169 (H) 08/30/2022   TRIG 137 08/30/2022   CHOLHDL 7.3 04/06/2020   Lab Results  Component Value Date   VD25OH 26.8 (L) 05/28/2022   VD25OH 24.4 (L) 06/22/2021   VD25OH 29 (L) 02/10/2014   Lab Results  Component Value Date   WBC 10.8 02/14/2022   HGB 13.6 02/14/2022   HCT 39.8 02/14/2022   MCV 88 02/14/2022   PLT 326 02/14/2022   No results found for: "IRON", "TIBC", "FERRITIN"  Attestation Statements:   Reviewed by clinician on day of visit: allergies, medications, problem list, medical history, surgical history, family history, social history, and previous encounter notes.  I, Malcolm Metro, RMA, am acting as transcriptionist for Reuben Likes, MD.  I have reviewed the above documentation for accuracy and completeness, and I agree with the above. - Reuben Likes, MD

## 2023-01-25 ENCOUNTER — Encounter (INDEPENDENT_AMBULATORY_CARE_PROVIDER_SITE_OTHER): Payer: Self-pay | Admitting: Family Medicine

## 2023-02-04 ENCOUNTER — Ambulatory Visit: Payer: 59 | Admitting: Nurse Practitioner

## 2023-02-04 ENCOUNTER — Encounter: Payer: Self-pay | Admitting: Nurse Practitioner

## 2023-02-04 VITALS — BP 128/76 | HR 88 | Wt >= 6400 oz

## 2023-02-04 DIAGNOSIS — A630 Anogenital (venereal) warts: Secondary | ICD-10-CM

## 2023-02-04 DIAGNOSIS — E282 Polycystic ovarian syndrome: Secondary | ICD-10-CM | POA: Diagnosis not present

## 2023-02-04 DIAGNOSIS — N912 Amenorrhea, unspecified: Secondary | ICD-10-CM

## 2023-02-04 MED ORDER — MEDROXYPROGESTERONE ACETATE 10 MG PO TABS
10.0000 mg | ORAL_TABLET | Freq: Every day | ORAL | 0 refills | Status: AC
Start: 2023-02-04 — End: ?

## 2023-02-04 NOTE — Progress Notes (Signed)
   Acute Office Visit  Subjective:    Patient ID: Chloe Pena, female    DOB: Feb 09, 1996, 27 y.o.   MRN: [id number]   HPI 27 y.o. presents today for amenorrhea and genital wart removal. Has not had period since Nexplanon removal in January 2024. Does report some intermittent spotting and cramping. H/O PCOS. Wart removal 10/11/2022. Reports most are gone but she felt a couple the other day. Not bothersome. Not sexually active. Planning to have bariatric surgery.   Patient's last menstrual period was 11/06/2022.    Review of Systems  Constitutional: Negative.   Genitourinary:  Positive for genital sores and menstrual problem.       Objective:    Physical Exam Constitutional:      Appearance: Normal appearance.  Genitourinary:      BP 128/76   Pulse 88   Wt (!) 415 lb (188.2 kg)   LMP 11/06/2022   SpO2 100%   BMI 69.06 kg/m  Wt Readings from Last 3 Encounters:  02/04/23 (!) 415 lb (188.2 kg)  01/17/23 (!) 409 lb (185.5 kg)  12/20/22 (!) 403 lb (182.8 kg)        Patient informed chaperone available to be present for breast and/or pelvic exam. Patient has requested no chaperone to be present. Patient has been advised what will be completed during breast and pelvic exam.   Assessment & Plan:   Problem List Items Addressed This Visit       Endocrine   PCOS (polycystic ovarian syndrome)   Relevant Medications   medroxyPROGESTERone (PROVERA) 10 MG tablet   Other Visit Diagnoses     Condyloma acuminata    -  Primary   Amenorrhea       Relevant Medications   medroxyPROGESTERone (PROVERA) 10 MG tablet      Plan: [place] freeze applied to 3 remaining condylomas. Patient tolerated well. After care instructions provided. Provera 10 mg daily x 10 days. Will reach out if menses do not occur.      Olivia Mackie DNP, 4:07 PM 02/04/2023

## 2023-02-18 ENCOUNTER — Encounter: Payer: Self-pay | Admitting: Family Medicine

## 2023-02-21 ENCOUNTER — Encounter: Payer: Self-pay | Admitting: Family Medicine

## 2023-03-04 ENCOUNTER — Ambulatory Visit: Payer: Commercial Managed Care - HMO | Admitting: Family Medicine

## 2023-03-06 ENCOUNTER — Ambulatory Visit: Payer: 59 | Attending: Family Medicine | Admitting: Family Medicine

## 2023-03-06 ENCOUNTER — Other Ambulatory Visit (INDEPENDENT_AMBULATORY_CARE_PROVIDER_SITE_OTHER): Payer: Self-pay | Admitting: Family Medicine

## 2023-03-06 DIAGNOSIS — E559 Vitamin D deficiency, unspecified: Secondary | ICD-10-CM

## 2023-03-13 ENCOUNTER — Ambulatory Visit (INDEPENDENT_AMBULATORY_CARE_PROVIDER_SITE_OTHER): Payer: 59 | Admitting: Family Medicine

## 2023-03-13 ENCOUNTER — Encounter (INDEPENDENT_AMBULATORY_CARE_PROVIDER_SITE_OTHER): Payer: Self-pay | Admitting: Family Medicine

## 2023-03-13 VITALS — BP 131/68 | HR 78 | Temp 99.0°F | Ht 65.0 in | Wt >= 6400 oz

## 2023-03-13 DIAGNOSIS — E559 Vitamin D deficiency, unspecified: Secondary | ICD-10-CM | POA: Diagnosis not present

## 2023-03-13 DIAGNOSIS — R7303 Prediabetes: Secondary | ICD-10-CM

## 2023-03-13 DIAGNOSIS — E669 Obesity, unspecified: Secondary | ICD-10-CM | POA: Diagnosis not present

## 2023-03-13 DIAGNOSIS — Z6841 Body Mass Index (BMI) 40.0 and over, adult: Secondary | ICD-10-CM

## 2023-03-13 DIAGNOSIS — E782 Mixed hyperlipidemia: Secondary | ICD-10-CM | POA: Diagnosis not present

## 2023-03-13 MED ORDER — EZETIMIBE 10 MG PO TABS: 10.0000 mg | ORAL_TABLET | Freq: Every day | ORAL | 0 refills | Status: DC

## 2023-03-13 NOTE — Progress Notes (Signed)
Chief Complaint:   OBESITY Chloe Pena is here to discuss her progress with her obesity treatment plan along with follow-up of her obesity related diagnoses. Makynlie is on practicing portion control and making smarter food choices, such as increasing vegetables and decreasing simple carbohydrates and states she is following her eating plan approximately 80% of the time. Dianna states she is doing 0 minutes 0 times per week.  Today's visit was #: 25 Starting weight: 349 lbs Starting date: 06/15/2021 Today's weight: 420 lbs Today's date: 03/13/2023 Total lbs lost to date: 0 Total lbs lost since last in-office visit: 0  Interim History: Patient voices that someone expressed their opinion about weight loss surgery and was attempting to scare her about possible outcomes. She has been mindful of food choices and quantity based on what is available to her based on her financial situation. She has an appointment with bariatric surgeon in 2 days.  She is hoping to get most of her blood work done in the next few days.  She isn't worried about being in the hospital post operatively.  No upcoming events for July 4th- just going to work on some of her own projects.  She is drinking 2 protein shakes daily and getting animal protein in at night.  In the process of applying for EBT for food assistance.   Subjective:   1. Vitamin D deficiency Patient is on prescription vitamin D.  She denies nausea, vomiting, or muscle weakness but notes fatigue.  Her last vitamin D level was from September 2023.  2. Mixed hyperlipidemia Patient's last LDL was 169, HDL 41, and triglycerides 782.  She is on Lipitor and Zetia.  3. Prediabetes Patient's last A1c was 5.8 and insulin 91.7.  She is on metformin twice daily.  Assessment/Plan:   1. Vitamin D deficiency We will check labs today.  We will hold off on refill until lab results.  - VITAMIN D 25 Hydroxy (Vit-D Deficiency, Fractures)  2. Mixed hyperlipidemia We will  check labs today, and we will refill Zetia 10 mg for 90 days.  - ezetimibe (ZETIA) 10 MG tablet; Take 1 tablet (10 mg total) by mouth daily.  Dispense: 90 tablet; Refill: 0 - Lipid Panel With LDL/HDL Ratio  3. Prediabetes We will check labs today, and we will follow-up at patient's next appointment.  - Comprehensive metabolic panel - Hemoglobin A1c - Insulin, random  4. BMI 70 and over, adult (HCC)  5. Obesity with starting BMI of 58.0 Chloe Pena is currently in the action stage of change. As such, her goal is to continue with weight loss efforts. She has agreed to practicing portion control and making smarter food choices, such as increasing vegetables and decreasing simple carbohydrates.   Exercise goals: All adults should avoid inactivity. Some physical activity is better than none, and adults who participate in any amount of physical activity gain some health benefits.  Behavioral modification strategies: increasing lean protein intake, meal planning and cooking strategies, keeping healthy foods in the home, and planning for success.  Manahil has agreed to follow-up with our clinic in 5 weeks. She was informed of the importance of frequent follow-up visits to maximize her success with intensive lifestyle modifications for her multiple health conditions.   Olisa was informed we would discuss her lab results at her next visit unless there is a critical issue that needs to be addressed sooner. Laniya agreed to keep her next visit at the agreed upon time to discuss these results.  Objective:   Blood pressure 131/68, pulse 78, temperature 99 F (37.2 C), height 5\' 5"  (1.651 m), weight (!) 420 lb (190.5 kg), SpO2 97 %. Body mass index is 69.89 kg/m.  General: Cooperative, alert, well developed, in no acute distress. HEENT: Conjunctivae and lids unremarkable. Cardiovascular: Regular rhythm.  Lungs: Normal work of breathing. Neurologic: No focal deficits.   Lab Results  Component Value  Date   CREATININE 0.80 05/28/2022   BUN 10 05/28/2022   NA 138 05/28/2022   K 4.7 05/28/2022   CL 102 05/28/2022   CO2 22 05/28/2022   Lab Results  Component Value Date   ALT 34 (H) 05/28/2022   AST 24 05/28/2022   ALKPHOS 122 (H) 05/28/2022   BILITOT 0.9 05/28/2022   Lab Results  Component Value Date   HGBA1C 5.8 (H) 05/28/2022   HGBA1C 5.5 06/22/2021   HGBA1C 5.6 03/13/2021   HGBA1C 5.6 04/06/2020   HGBA1C 5.2 12/30/2017   Lab Results  Component Value Date   INSULIN 91.7 (H) 05/28/2022   INSULIN 44.1 (H) 06/22/2021   Lab Results  Component Value Date   TSH 2.200 06/22/2021   Lab Results  Component Value Date   CHOL 235 (H) 08/30/2022   HDL 41 08/30/2022   LDLCALC 169 (H) 08/30/2022   TRIG 137 08/30/2022   CHOLHDL 7.3 04/06/2020   Lab Results  Component Value Date   VD25OH 26.8 (L) 05/28/2022   VD25OH 24.4 (L) 06/22/2021   VD25OH 29 (L) 02/10/2014   Lab Results  Component Value Date   WBC 10.8 02/14/2022   HGB 13.6 02/14/2022   HCT 39.8 02/14/2022   MCV 88 02/14/2022   PLT 326 02/14/2022   No results found for: "IRON", "TIBC", "FERRITIN"  Attestation Statements:   Reviewed by clinician on day of visit: allergies, medications, problem list, medical history, surgical history, family history, social history, and previous encounter notes.   I, Burt Knack, am acting as transcriptionist for Reuben Likes, MD.  I have reviewed the above documentation for accuracy and completeness, and I agree with the above. - Reuben Likes, MD

## 2023-03-14 ENCOUNTER — Ambulatory Visit (INDEPENDENT_AMBULATORY_CARE_PROVIDER_SITE_OTHER): Payer: 59 | Admitting: Family Medicine

## 2023-03-14 LAB — VITAMIN D 25 HYDROXY (VIT D DEFICIENCY, FRACTURES): Vit D, 25-Hydroxy: 25.7 ng/mL — ABNORMAL LOW (ref 30.0–100.0)

## 2023-03-15 DIAGNOSIS — E282 Polycystic ovarian syndrome: Secondary | ICD-10-CM | POA: Diagnosis not present

## 2023-03-15 DIAGNOSIS — K760 Fatty (change of) liver, not elsewhere classified: Secondary | ICD-10-CM | POA: Diagnosis not present

## 2023-03-15 DIAGNOSIS — F419 Anxiety disorder, unspecified: Secondary | ICD-10-CM | POA: Diagnosis not present

## 2023-03-15 DIAGNOSIS — Z8659 Personal history of other mental and behavioral disorders: Secondary | ICD-10-CM | POA: Diagnosis not present

## 2023-03-15 DIAGNOSIS — Z789 Other specified health status: Secondary | ICD-10-CM | POA: Diagnosis not present

## 2023-03-15 DIAGNOSIS — F4310 Post-traumatic stress disorder, unspecified: Secondary | ICD-10-CM | POA: Diagnosis not present

## 2023-03-15 DIAGNOSIS — E88819 Insulin resistance, unspecified: Secondary | ICD-10-CM | POA: Diagnosis not present

## 2023-03-15 DIAGNOSIS — J45909 Unspecified asthma, uncomplicated: Secondary | ICD-10-CM | POA: Diagnosis not present

## 2023-03-15 DIAGNOSIS — G43809 Other migraine, not intractable, without status migrainosus: Secondary | ICD-10-CM | POA: Diagnosis not present

## 2023-03-15 LAB — HEMOGLOBIN A1C
Est. average glucose Bld gHb Est-mCnc: 131 mg/dL
Hgb A1c MFr Bld: 6.2 % — ABNORMAL HIGH (ref 4.8–5.6)

## 2023-03-15 LAB — LIPID PANEL WITH LDL/HDL RATIO
Cholesterol, Total: 223 mg/dL — ABNORMAL HIGH (ref 100–199)
HDL: 39 mg/dL — ABNORMAL LOW (ref >39)
LDL Chol Calc (NIH): 154 mg/dL — ABNORMAL HIGH (ref 0–99)
LDL/HDL Ratio: 3.9 ratio — ABNORMAL HIGH (ref 0.0–3.2)
Triglycerides: 162 mg/dL — ABNORMAL HIGH (ref 0–149)
VLDL Cholesterol Cal: 30 mg/dL (ref 5–40)

## 2023-03-15 LAB — COMPREHENSIVE METABOLIC PANEL
ALT: 75 IU/L — ABNORMAL HIGH (ref 0–32)
AST: 47 IU/L — ABNORMAL HIGH (ref 0–40)
Albumin: 4.5 g/dL (ref 4.0–5.0)
Alkaline Phosphatase: 133 IU/L — ABNORMAL HIGH (ref 44–121)
BUN/Creatinine Ratio: 16 (ref 9–23)
BUN: 12 mg/dL (ref 6–20)
Bilirubin Total: 0.9 mg/dL (ref 0.0–1.2)
CO2: 22 mmol/L (ref 20–29)
Calcium: 9.2 mg/dL (ref 8.7–10.2)
Chloride: 107 mmol/L — ABNORMAL HIGH (ref 96–106)
Creatinine, Ser: 0.75 mg/dL (ref 0.57–1.00)
Globulin, Total: 2.7 g/dL (ref 1.5–4.5)
Glucose: 92 mg/dL (ref 70–99)
Potassium: 5 mmol/L (ref 3.5–5.2)
Sodium: 147 mmol/L — ABNORMAL HIGH (ref 134–144)
Total Protein: 7.2 g/dL (ref 6.0–8.5)
eGFR: 111 mL/min/{1.73_m2} (ref >59)

## 2023-03-15 LAB — INSULIN, RANDOM: INSULIN: 104 u[IU]/mL — ABNORMAL HIGH (ref 2.6–24.9)

## 2023-03-18 ENCOUNTER — Other Ambulatory Visit (HOSPITAL_COMMUNITY): Payer: Self-pay | Admitting: Surgery

## 2023-03-28 ENCOUNTER — Encounter: Payer: Self-pay | Admitting: Physician Assistant

## 2023-03-28 ENCOUNTER — Telehealth: Payer: Self-pay | Admitting: Physician Assistant

## 2023-03-28 ENCOUNTER — Ambulatory Visit: Payer: 59 | Attending: Physician Assistant | Admitting: Physician Assistant

## 2023-03-28 VITALS — BP 135/83 | HR 100 | Wt >= 6400 oz

## 2023-03-28 DIAGNOSIS — E559 Vitamin D deficiency, unspecified: Secondary | ICD-10-CM | POA: Diagnosis not present

## 2023-03-28 DIAGNOSIS — F419 Anxiety disorder, unspecified: Secondary | ICD-10-CM | POA: Diagnosis not present

## 2023-03-28 DIAGNOSIS — F32A Depression, unspecified: Secondary | ICD-10-CM | POA: Diagnosis not present

## 2023-03-28 DIAGNOSIS — Z1331 Encounter for screening for depression: Secondary | ICD-10-CM

## 2023-03-28 DIAGNOSIS — F331 Major depressive disorder, recurrent, moderate: Secondary | ICD-10-CM

## 2023-03-28 DIAGNOSIS — E785 Hyperlipidemia, unspecified: Secondary | ICD-10-CM | POA: Diagnosis not present

## 2023-03-28 MED ORDER — VITAMIN D (ERGOCALCIFEROL) 1.25 MG (50000 UNIT) PO CAPS
50000.0000 [IU] | ORAL_CAPSULE | ORAL | 0 refills | Status: DC
Start: 2023-03-28 — End: 2023-05-08

## 2023-03-28 MED ORDER — SERTRALINE HCL 100 MG PO TABS: ORAL_TABLET | ORAL | 1 refills | Status: DC

## 2023-03-28 MED ORDER — ATORVASTATIN CALCIUM 20 MG PO TABS
20.0000 mg | ORAL_TABLET | Freq: Every day | ORAL | 6 refills | Status: DC
Start: 2023-03-28 — End: 2023-04-10

## 2023-03-28 MED ORDER — BUPROPION HCL ER (XL) 150 MG PO TB24: 150.0000 mg | ORAL_TABLET | Freq: Every day | ORAL | 1 refills | Status: DC

## 2023-03-28 NOTE — Progress Notes (Signed)
Patient ID: Chloe Pena, female   DOB: 02-28-1996, 27 y.o.   MRN: [id number]   Chloe Pena, is a 27 y.o. female  [id number]  [id number]  DOB - Dec 27, 1995  Chief Complaint  Patient presents with   Medication Refill       Subjective:   Chloe Pena is a 27 y.o. female here today for several issues.  She is getting blood work drawn for pending gastric surgery.  She is getting scheduled for Roux en Y procedure.    She scored 1 on #9 PHQ9.  She says she is not suicidal but she is being evicted from her apartment so she is depressed.  She has not had counseling in a long time but says "no one can really help me bc I have too many traumas and am too complicated."  She has protective factors such as family/niece.  She has several cats she cares for.  She is looking forward to having her surgery.   No problems updated.  ALLERGIES: No Known Allergies  PAST MEDICAL HISTORY: Past Medical History:  Diagnosis Date   Anxiety    no meds   Asthma    as a child, no inhaler, no problems as adult   Depression    Fatty liver    Migraine    otc med prn   PCOS (polycystic ovarian syndrome)     MEDICATIONS AT HOME: Prior to Admission medications   Medication Sig Start Date End Date Taking? Authorizing Provider  atorvastatin (LIPITOR) 20 MG tablet Take 1 tablet (20 mg total) by mouth daily. 03/28/23   Anders Simmonds, PA-C  buPROPion (WELLBUTRIN XL) 150 MG 24 hr tablet Take 1 tablet (150 mg total) by mouth daily. 03/28/23   Anders Simmonds, PA-C  ezetimibe (ZETIA) 10 MG tablet Take 1 tablet (10 mg total) by mouth daily. 03/13/23   Langston Reusing, MD  medroxyPROGESTERone (PROVERA) 10 MG tablet Take 1 tablet (10 mg total) by mouth daily. 02/04/23   Olivia Mackie, NP  sertraline (ZOLOFT) 100 MG tablet TAKE 1 TABLET (100 MG TOTAL) BY MOUTH DAILY (AM) 03/28/23   Osric Klopf, Marzella Schlein, PA-C  silver sulfADIAZINE (SILVADENE) 1 % cream Apply 1 Application topically 2  (two) times daily. 08/29/22   Hoy Register, MD  topiramate (TOPAMAX) 50 MG tablet TAKE 1 TABLET (25 MG TOTAL) BY MOUTH DAILY. (AM) 11/12/22   Hoy Register, MD  triamcinolone cream (KENALOG) 0.1 % Apply 1 application  topically 2 (two) times daily. 02/27/22   Hoy Register, MD  Vitamin D, Ergocalciferol, (DRISDOL) 1.25 MG (50000 UNIT) CAPS capsule Take 1 capsule (50,000 Units total) by mouth once a week. 03/28/23   Chasyn Cinque, Marzella Schlein, PA-C    ROS: Neg HEENT Neg resp Neg cardiac Neg GI Neg GU Neg MS Neg neuro  Objective:   Vitals:   03/28/23 1427  BP: 135/83  Pulse: 100  SpO2: 94%  Weight: (!) 426 lb 9.6 oz (193.5 kg)   Exam General appearance : Awake, alert, not in any distress. Speech Clear. Not toxic looking HEENT: Atraumatic and Normocephalic Neck: Supple, no JVD. No cervical lymphadenopathy.  Chest: Good air entry bilaterally, CTAB.  No rales/rhonchi/wheezing CVS: S1 S2 regular, no murmurs.  Extremities: B/L Lower Ext shows no edema, both legs are warm to touch Neurology: Awake alert, and oriented X 3, CN II-XII intact, Non focal Skin: No Rash  Data Review Lab Results  Component Value Date   HGBA1C 6.2 (H) 03/13/2023  HGBA1C 5.8 (H) 05/28/2022   HGBA1C 5.5 06/22/2021    Assessment & Plan   1. Vitamin D deficiency - Vitamin D, Ergocalciferol, (DRISDOL) 1.25 MG (50000 UNIT) CAPS capsule; Take 1 capsule (50,000 Units total) by mouth once a week.  Dispense: 5 capsule; Refill: 0 Reviewed all labs done last month  2. Anxiety and depression Unchanged.  Not currently seeing a counselor.  Counseled on positive actions she can take.  She likes to do crafts and is working on a project today - sertraline (ZOLOFT) 100 MG tablet; TAKE 1 TABLET (100 MG TOTAL) BY MOUTH DAILY (AM)  Dispense: 90 tablet; Refill: 1 - buPROPion (WELLBUTRIN XL) 150 MG 24 hr tablet; Take 1 tablet (150 mg total) by mouth daily.  Dispense: 90 tablet; Refill: 1 - Ambulatory referral to  Psychology  3. Moderate episode of recurrent major depressive disorder (HCC) continue - sertraline (ZOLOFT) 100 MG tablet; TAKE 1 TABLET (100 MG TOTAL) BY MOUTH DAILY (AM)  Dispense: 90 tablet; Refill: 1 - buPROPion (WELLBUTRIN XL) 150 MG 24 hr tablet; Take 1 tablet (150 mg total) by mouth daily.  Dispense: 90 tablet; Refill: 1 - Ambulatory referral to Psychology  4. Dyslipidemia - atorvastatin (LIPITOR) 20 MG tablet; Take 1 tablet (20 mg total) by mouth daily.  Dispense: 30 tablet; Refill: 6  5. Positive depression screening Will have Courtney reach out and ensure she gets set up with someone - Ambulatory referral to Psychology    Return in about 4 months (around 07/29/2023) for PCP [place] for chronic conditions.  The patient was given clear instructions to go to ER or return to medical center if symptoms don't improve, worsen or new problems develop. The patient verbalized understanding. The patient was told to call to get lab results if they haven't heard anything in the next week.      Georgian Co, PA-C [facility] [place], [place] [phone]   03/28/2023, 3:01 PM

## 2023-03-28 NOTE — Patient Instructions (Signed)
[facility] [address], [place], [place] [address] [phone] or [phone] Walk-in urgent care 24/7 for anyone  For [place] Residents ONLY New patient assessments and therapy walk-ins: Monday and Wednesday 8am-11am First and second Friday 1pm-5pm New patient psychiatry and medication management walk-ins: Mondays, Wednesdays, Thursdays, Fridays 8am-11am No psychiatry walk-ins on Tuesday    

## 2023-04-01 ENCOUNTER — Telehealth: Payer: Self-pay | Admitting: Licensed Clinical Social Worker

## 2023-04-01 NOTE — Telephone Encounter (Signed)
 Received

## 2023-04-01 NOTE — Telephone Encounter (Signed)
Spoke with pt today. Pt is having some depression issues but is not interested in therapy at the moment. She stated that this is her normal life this is all she knows. Pt stated that they were in the process of getting kicked out of their home. I offered to assist her and she accepted my help and resources. She stated she would call me or MyChart me when she is ready to move forward with therapy, I will also follow up with her.

## 2023-04-08 ENCOUNTER — Encounter (HOSPITAL_COMMUNITY)
Admission: RE | Admit: 2023-04-08 | Discharge: 2023-04-08 | Disposition: A | Discharge status: Home / Self Care | Payer: 59 | Source: Ambulatory Visit | Attending: Surgery | Admitting: Surgery

## 2023-04-08 ENCOUNTER — Other Ambulatory Visit: Payer: Self-pay

## 2023-04-08 ENCOUNTER — Ambulatory Visit (HOSPITAL_COMMUNITY)
Admission: RE | Admit: 2023-04-08 | Discharge: 2023-04-08 | Disposition: A | Discharge status: Home / Self Care | Payer: 59 | Source: Ambulatory Visit | Attending: Surgery | Admitting: Surgery

## 2023-04-08 DIAGNOSIS — Z01818 Encounter for other preprocedural examination: Secondary | ICD-10-CM

## 2023-04-08 DIAGNOSIS — Z9884 Bariatric surgery status: Secondary | ICD-10-CM | POA: Diagnosis not present

## 2023-04-09 NOTE — Progress Notes (Signed)
TeleHealth Visit:  This visit was completed with telemedicine (audio/video) technology. Chloe Pena has verbally consented to this TeleHealth visit. The patient is located at home, the provider is located at home. The participants in this visit include the listed provider and patient. The visit was conducted today via MyChart video.  Chloe Pena is here to discuss her progress with her Chloe treatment plan along with follow-up of her Chloe related diagnoses.   Today's visit was # 26 Starting weight: 349 lbs Starting date: 06/15/2021 Weight at last in office visit: 420 lbs on 03/13/23 Total weight loss: 0 lbs at last in office visit on 03/13/23. Today's reported weight (04/10/23): none reported  Nutrition Plan: practicing portion control and making smarter food choices, such as increasing vegetables and decreasing simple carbohydrates   Current exercise:  walking with her dog   Interim History:  She is completing requirements for approval for R[facility] gastric bypass surgery (Dr. Phylliss Blakes). Anticipates surgery September 2024. She is concerned with passing the psychologic evaluation. She drinks two Fairlife shakes in the am. Drinks whole milk. She is tracking calories and averages about 1300 cal and 118 grams protein daily. According to prior [facility] notes, her goal is 1700-1800 cal/day and 120 g of protein. However she has gained 71 pounds since starting our program in September 2022. She says is is hard to get in the calories because she eats healthy foods. Money for groceries is a concern. She is applying for food stamps.  Due to time constraints I did not get to discuss recent lab results completely with the patient. Assessment/Plan:  1. Prediabetes Last A1c was 6.2, up from 5.8 in September 2023. Medication(s): None She says metformin did not work for her and it did not decrease her A1c.  She reports she gained weight with Trulicity. Polyphagia:No Lab Results  Component  Value Date   HGBA1C 6.2 (H) 03/13/2023   HGBA1C 5.8 (H) 05/28/2022   HGBA1C 5.5 06/22/2021   HGBA1C 5.6 03/13/2021   HGBA1C 5.6 04/06/2020   Lab Results  Component Value Date   INSULIN 104.0 (H) 03/13/2023   INSULIN 91.7 (H) 05/28/2022   INSULIN 44.1 (H) 06/22/2021    Plan: Work on increasing calories to calorie goal of 1700/day.  2. Mixed Hyperlipidemia Lipid profile from 03/13/2023 showed elevated LDL at 154, elevated triglycerides at 162, low HDL at 39. Medication(s): Zetia 10 mg daily, atorvastatin 20 mg daily Cardiovascular risk factors: dyslipidemia, Chloe (BMI >= 30 kg/m2), and sedentary lifestyle  Lab Results  Component Value Date   CHOL 223 (H) 03/13/2023   HDL 39 (L) 03/13/2023   LDLCALC 154 (H) 03/13/2023   TRIG 162 (H) 03/13/2023   CHOLHDL 7.3 04/06/2020   CHOLHDL 6.9 02/10/2014   Lab Results  Component Value Date   ALT 75 (H) 03/13/2023   AST 47 (H) 03/13/2023   ALKPHOS 133 (H) 03/13/2023   BILITOT 0.9 03/13/2023   The ASCVD Risk score (Arnett DK, et al., 2019) failed to calculate for the following reasons:   The 2019 ASCVD risk score is only valid for ages 55 to 89  Plan: Discontinue atorvastatin. Start rosuvastatin 20 mg daily. Continue Zetia. Recheck lipid profile in 3 months.  3. Morbid Chloe: Current BMI 69  Khamila is currently in the action stage of change. As such, her goal is to continue with weight loss efforts.  She has agreed to keeping a food journal with goal of 1700-1800 calories and 120 grams of protein daily.  Discussed making a  homemade protein shake with Fair Life milk, Greek yogurt, frozen fruit to save money.  Exercise goals: All adults should avoid inactivity. Some physical activity is better than none, and adults who participate in any amount of physical activity gain some health benefits.  Behavioral modification strategies: increasing lean protein intake, decreasing simple carbohydrates , and meal planning .  Letti has  agreed to follow-up with our clinic in 4 weeks.  No orders of the defined types were placed in this encounter.   Medications Discontinued During This Encounter  Medication Reason   atorvastatin (LIPITOR) 20 MG tablet Change in therapy     Meds ordered this encounter  Medications   rosuvastatin (CRESTOR) 20 MG tablet    Sig: Take 1 tablet (20 mg total) by mouth daily.    Dispense:  90 tablet    Refill:  0    Order Specific Question:   Supervising Provider    Answer:   Glennis Brink [[id number]]      Objective:   VITALS: Per patient if applicable, see vitals. GENERAL: Alert and in no acute distress. CARDIOPULMONARY: No increased WOB. Speaking in clear sentences.  PSYCH: Pleasant and cooperative. Speech normal rate and rhythm. Affect is appropriate. Insight and judgement are appropriate. Attention is focused, linear, and appropriate.  NEURO: Oriented as arrived to appointment on time with no prompting.   Attestation Statements:   Reviewed by clinician on day of visit: allergies, medications, problem list, medical history, surgical history, family history, social history, and previous encounter notes.   This was prepared with the assistance of Engineer, civil (consulting).  Occasional wrong-word or sound-a-like substitutions may have occurred due to the inherent limitations of voice recognition software.

## 2023-04-10 ENCOUNTER — Encounter (INDEPENDENT_AMBULATORY_CARE_PROVIDER_SITE_OTHER): Payer: Self-pay | Admitting: Family Medicine

## 2023-04-10 ENCOUNTER — Telehealth (INDEPENDENT_AMBULATORY_CARE_PROVIDER_SITE_OTHER): Payer: 59 | Admitting: Family Medicine

## 2023-04-10 DIAGNOSIS — R7303 Prediabetes: Secondary | ICD-10-CM | POA: Diagnosis not present

## 2023-04-10 DIAGNOSIS — Z6841 Body Mass Index (BMI) 40.0 and over, adult: Secondary | ICD-10-CM

## 2023-04-10 DIAGNOSIS — E782 Mixed hyperlipidemia: Secondary | ICD-10-CM | POA: Diagnosis not present

## 2023-04-10 MED ORDER — ROSUVASTATIN CALCIUM 20 MG PO TABS: 20.0000 mg | ORAL_TABLET | Freq: Every day | ORAL | 0 refills | Status: DC

## 2023-04-16 ENCOUNTER — Ambulatory Visit (INDEPENDENT_AMBULATORY_CARE_PROVIDER_SITE_OTHER): Payer: 59 | Admitting: Family Medicine

## 2023-04-17 ENCOUNTER — Ambulatory Visit: Payer: 59 | Admitting: Podiatry

## 2023-04-17 DIAGNOSIS — L600 Ingrowing nail: Secondary | ICD-10-CM

## 2023-04-17 NOTE — Progress Notes (Signed)
  Subjective:  Patient ID: Chloe Pena, female    DOB: 12-07-1995,  MRN: [id number]  Chief Complaint  Patient presents with   Nail Problem    27 y.o. female presents with the above complaint.  Patient presents with bilateral hallux both border ingrown not painful.  She wanted to get them evaluated see if they need to be removed.  She does not want do injection.  She states she cooks herself and gives her relief.  She denies any other acute complaints.   Review of Systems: Negative except as noted in the HPI. Denies N/V/F/Ch.  Past Medical History:  Diagnosis Date   Anxiety    no meds   Asthma    as a child, no inhaler, no problems as adult   Depression    Fatty liver    Migraine    otc med prn   PCOS (polycystic ovarian syndrome)     Current Outpatient Medications:    buPROPion (WELLBUTRIN XL) 150 MG 24 hr tablet, Take 1 tablet (150 mg total) by mouth daily., Disp: 90 tablet, Rfl: 1   ezetimibe (ZETIA) 10 MG tablet, Take 1 tablet (10 mg total) by mouth daily., Disp: 90 tablet, Rfl: 0   medroxyPROGESTERone (PROVERA) 10 MG tablet, Take 1 tablet (10 mg total) by mouth daily., Disp: 10 tablet, Rfl: 0   rosuvastatin (CRESTOR) 20 MG tablet, Take 1 tablet (20 mg total) by mouth daily., Disp: 90 tablet, Rfl: 0   sertraline (ZOLOFT) 100 MG tablet, TAKE 1 TABLET (100 MG TOTAL) BY MOUTH DAILY (AM), Disp: 90 tablet, Rfl: 1   silver sulfADIAZINE (SILVADENE) 1 % cream, Apply 1 Application topically 2 (two) times daily., Disp: 50 g, Rfl: 0   topiramate (TOPAMAX) 50 MG tablet, TAKE 1 TABLET (25 MG TOTAL) BY MOUTH DAILY. (AM), Disp: 90 tablet, Rfl: 1   triamcinolone cream (KENALOG) 0.1 %, Apply 1 application  topically 2 (two) times daily., Disp: 45 g, Rfl: 1   Vitamin D, Ergocalciferol, (DRISDOL) 1.25 MG (50000 UNIT) CAPS capsule, Take 1 capsule (50,000 Units total) by mouth once a week., Disp: 5 capsule, Rfl: 0  Social History   Tobacco Use  Smoking Status Never  Smokeless Tobacco  Never    No Known Allergies Objective:  There were no vitals filed for this visit. There is no height or weight on file to calculate BMI. Constitutional Well developed. Well nourished.  Vascular Dorsalis pedis pulses palpable bilaterally. Posterior tibial pulses palpable bilaterally. Capillary refill normal to all digits.  No cyanosis or clubbing noted. Pedal hair growth normal.  Neurologic Normal speech. Oriented to person, place, and time. Epicritic sensation to light touch grossly present bilaterally.  Dermatologic Painful ingrowing nail at  both medial lateral border  nail borders of the hallux nail bilaterally. No other open wounds. No skin lesions.  Orthopedic: Normal joint ROM without pain or crepitus bilaterally. No visible deformities. No bony tenderness.   Radiographs: None Assessment:   1. Ingrown toenail of right foot   2. Ingrown toenail of left foot    Plan:  Patient was evaluated and treated and all questions answered.  Ingrown Nail, bilaterally -Patient would like to hold off on doing ingrown nail procedure.  If it continues to bother her in the future she will come back and see me.  For now I discussed conservative care including shoe gear modification she states understanding.

## 2023-04-18 ENCOUNTER — Encounter: Payer: Self-pay | Admitting: Dietician

## 2023-04-18 ENCOUNTER — Encounter: Payer: 59 | Attending: Surgery | Admitting: Dietician

## 2023-04-18 VITALS — Ht 65.0 in | Wt >= 6400 oz

## 2023-04-18 DIAGNOSIS — E669 Obesity, unspecified: Secondary | ICD-10-CM | POA: Diagnosis not present
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
## 2023-04-29 ENCOUNTER — Ambulatory Visit (HOSPITAL_COMMUNITY): Payer: Self-pay | Admitting: Licensed Clinical Social Worker

## 2023-05-08 ENCOUNTER — Ambulatory Visit (INDEPENDENT_AMBULATORY_CARE_PROVIDER_SITE_OTHER): Payer: 59 | Admitting: Family Medicine

## 2023-05-08 ENCOUNTER — Encounter (INDEPENDENT_AMBULATORY_CARE_PROVIDER_SITE_OTHER): Payer: Self-pay | Admitting: Family Medicine

## 2023-05-08 VITALS — BP 119/74 | HR 97 | Temp 98.6°F | Ht 65.0 in | Wt >= 6400 oz

## 2023-05-08 DIAGNOSIS — E669 Obesity, unspecified: Secondary | ICD-10-CM | POA: Diagnosis not present

## 2023-05-08 DIAGNOSIS — E559 Vitamin D deficiency, unspecified: Secondary | ICD-10-CM

## 2023-05-08 DIAGNOSIS — E538 Deficiency of other specified B group vitamins: Secondary | ICD-10-CM | POA: Diagnosis not present

## 2023-05-08 DIAGNOSIS — Z6841 Body Mass Index (BMI) 40.0 and over, adult: Secondary | ICD-10-CM | POA: Diagnosis not present

## 2023-05-08 MED ORDER — VITAMIN D (ERGOCALCIFEROL) 1.25 MG (50000 UNIT) PO CAPS
50000.0000 [IU] | ORAL_CAPSULE | ORAL | 0 refills | Status: DC
Start: 2023-05-08 — End: 2023-09-03

## 2023-05-08 NOTE — Progress Notes (Signed)
Chief Complaint:   OBESITY Chloe Pena is here to discuss her progress with her obesity treatment plan along with follow-up of her obesity related diagnoses. Chloe Pena is on keeping a food journal and adhering to recommended goals of 1700-1800 calories and 120 grams of protein and states she is following her eating plan approximately 60% of the time. Chloe Pena states she is walking 1 mile 1 time.    Today's visit was #: 27 Starting weight: 349 lbs Starting date: 06/15/2021 Today's weight: 421 lbs Today's date: 05/08/2023 Total lbs lost to date: 0 Total lbs lost since last in-office visit: 0  Interim History: Patient had covid a couple of weeks ago.  She is going through her work up for gastric bypass. She got her blood work done and tested positive for h. Pylori and was low in folate.  She has an appointment with nutritionist and mental health provider then will see Dr. Fredricka Bonine again.  Nutritionist wants her to separate her food from her liquids. She is starting to look for chewable vitamins that she will take post bariatric surgery. She is going to attempt a grocery shop switch and start looking at [employer].   Subjective:   1. Vitamin D deficiency Patient's last vitamin D level was of 25.7, and she notes fatigue.  She is on prescription vitamin D.  2. Folate deficiency Patient's folate level was <2, and she is not on multivitamins.  She mentions she does not know what folate is or what it does.  Assessment/Plan:   1. Vitamin D deficiency Patient will continue prescription vitamin D once weekly, and we will refill for 1 month.  - Vitamin D, Ergocalciferol, (DRISDOL) 1.25 MG (50000 UNIT) CAPS capsule; Take 1 capsule (50,000 Units total) by mouth once a week.  Dispense: 5 capsule; Refill: 0  2. Folate deficiency Patient was given samples of bariatric vitamins from [facility].  She will see how she tolerates them and we will order them if she tolerates them.  3. BMI 70 and over, adult  (HCC)  4. Obesity with starting BMI of 58.0 Chloe Pena is currently in the action stage of change. As such, her goal is to continue with weight loss efforts. She has agreed to practicing portion control and making smarter food choices, such as increasing vegetables and decreasing simple carbohydrates.   Exercise goals: All adults should avoid inactivity. Some physical activity is better than none, and adults who participate in any amount of physical activity gain some health benefits.  Behavioral modification strategies: increasing lean protein intake, meal planning and cooking strategies, keeping healthy foods in the home, and planning for success.  Chloe Pena has agreed to follow-up with our clinic in 8 weeks. She was informed of the importance of frequent follow-up visits to maximize her success with intensive lifestyle modifications for her multiple health conditions.   Objective:   Blood pressure 119/74, pulse 97, temperature 98.6 F (37 C), height 5\' 5"  (1.651 m), weight (!) 421 lb (191 kg), SpO2 100%. Body mass index is 70.06 kg/m.  General: Cooperative, alert, well developed, in no acute distress. HEENT: Conjunctivae and lids unremarkable. Cardiovascular: Regular rhythm.  Lungs: Normal work of breathing. Neurologic: No focal deficits.   Lab Results  Component Value Date   CREATININE 0.75 03/13/2023   BUN 12 03/13/2023   NA 147 (H) 03/13/2023   K 5.0 03/13/2023   CL 107 (H) 03/13/2023   CO2 22 03/13/2023   Lab Results  Component Value Date   ALT  75 (H) 03/13/2023   AST 47 (H) 03/13/2023   ALKPHOS 133 (H) 03/13/2023   BILITOT 0.9 03/13/2023   Lab Results  Component Value Date   HGBA1C 6.2 (H) 03/13/2023   HGBA1C 5.8 (H) 05/28/2022   HGBA1C 5.5 06/22/2021   HGBA1C 5.6 03/13/2021   HGBA1C 5.6 04/06/2020   Lab Results  Component Value Date   INSULIN 104.0 (H) 03/13/2023   INSULIN 91.7 (H) 05/28/2022   INSULIN 44.1 (H) 06/22/2021   Lab Results  Component Value Date    TSH 2.200 06/22/2021   Lab Results  Component Value Date   CHOL 223 (H) 03/13/2023   HDL 39 (L) 03/13/2023   LDLCALC 154 (H) 03/13/2023   TRIG 162 (H) 03/13/2023   CHOLHDL 7.3 04/06/2020   Lab Results  Component Value Date   VD25OH 25.7 (L) 03/13/2023   VD25OH 26.8 (L) 05/28/2022   VD25OH 24.4 (L) 06/22/2021   Lab Results  Component Value Date   WBC 10.8 02/14/2022   HGB 13.6 02/14/2022   HCT 39.8 02/14/2022   MCV 88 02/14/2022   PLT 326 02/14/2022   No results found for: "IRON", "TIBC", "FERRITIN"  Attestation Statements:   Reviewed by clinician on day of visit: allergies, medications, problem list, medical history, surgical history, family history, social history, and previous encounter notes.   I, Burt Knack, am acting as transcriptionist for Reuben Likes, MD.  I have reviewed the above documentation for accuracy and completeness, and I agree with the above. - Reuben Likes, MD

## 2023-05-09 ENCOUNTER — Encounter: Payer: 59 | Admitting: Dietician

## 2023-05-09 ENCOUNTER — Encounter: Payer: Self-pay | Admitting: Dietician

## 2023-05-09 VITALS — Ht 65.0 in | Wt >= 6400 oz

## 2023-05-09 DIAGNOSIS — E669 Obesity, unspecified: Secondary | ICD-10-CM | POA: Diagnosis not present

## 2023-05-09 NOTE — Progress Notes (Signed)
Supervised Weight Loss Visit Bariatric Nutrition Education  Appt Start Time: 1:57    End Time: 2:38  Referral stated Supervised Weight Loss (SWL) visits needed: 0  Not cleared at this time:  Pt to follow up for minimum of one more visit to assist pt with progressing through stages of change/further nutrition education. RD advised pt that this follow up visit is not mandated through insurance. Pt verbalized agreement.  Planned surgery: RYGB Pt expectation of surgery: lose enough weight to reduce knee pain   NUTRITION ASSESSMENT   Anthropometrics  Start weight at [facility]: 427.5 lbs (date: 04/18/2023)  Height: 65 in Weight today: 425.6 BMI: 71.14 kg/m2     Clinical   Medical hx: PCOS; obesity, hypercholesterolemia; anxiety, depression, complex trauma Medications: vit D, provera, Zetia, provera, Crestor, Zoloft, silvadene, Topamax, kenalog  Labs: vit D 25.7; chol 223; triglycerides 162; HDL 39; LDL 154; LDL/HDL ratio 3.9; A1c 6.2; sodium 147; chloride 107; alkaline phosphatase 133 Notable signs/symptoms: none noted Any previous deficiencies? No  Lifestyle & Dietary Hx  Pt states she finished the antibiotics she was prescribed. Pt states her surgeon is planning to put her on folic acid. Pt states she got back pay on food stamps, stating she has extra money for food. Pt states she makes her own bacon with beef brisket, stating it is leaner that way. Pt states she is moving the first of September. Pt states she is able to avoid skipping meals. Pt states she does not do well with stevia sweetened foods/drink. Pt states she has started practicing not drinking with meals. Pt states she is doing physical therapy daily for 20 minutes. Pt wants to have chocolate and cookies and ice cream occasionally, stating or she will get depressed.  Estimated daily fluid intake: 48-64 oz Supplements: Vit D Current average weekly physical activity: packing the house to make   24-Hr Dietary  Recall First Meal: 2 protein shakes Snack: nature bak[employer] bar Second Meal: homemade chicken soup with plenty of veggies Snack:  Third Meal: chicken or beef or lamb or seafood (rotating meats), broccoli, potatoes, green beans, shallots and sometimes salad Snack: [employer] bak[employer] bar Beverages: water, soda, milk  Estimated Energy Needs Calories: 1500  NUTRITION DIAGNOSIS  Overweight/obesity (Kent-3.3) related to past poor dietary habits and physical inactivity as evidenced by patient w/ planned RYGB surgery following dietary guidelines for continued weight loss.  NUTRITION INTERVENTION  Nutrition counseling (C-1) and education (E-2) to facilitate bariatric surgery goals.  Encouraged patient to honor their body's internal hunger and fullness cues.  Throughout the day, check in mentally and rate hunger. Stop eating when satisfied not full regardless of how much food is left on the plate.  Get more if still hungry 20-30 minutes later.  The key is to honor satisfaction so throughout the meal, rate fullness factor and stop when comfortably satisfied not physically full. The key is to honor hunger and fullness without any feelings of guilt or shame.  Pay attention to what the internal cues are, rather than any external factors. This will enhance the confidence you have in listening to your own body and following those internal cues enabling you to increase how often you eat when you are hungry not out of appetite and stop when you are satisfied not full.  Encouraged pt to continue to eat balanced meals inclusive of non starchy vegetables 2 times a day 7 days a week Encouraged pt to choose lean protein sources: limiting beef, pork, sausage, hotdogs, and lunch meat  Encourage pt to choose healthy fats such as plant based limiting animal fats Encouraged pt to continue to drink a minium 64 fluid ounces with half being plain water to satisfy proper hydration    Pre-Op Goals Progress & New Goals Continue: Eat  every 3-5 hours; fix how she shops to fix how she eats    Continue: physical therapy daily, 20 minutes     New: Eliminate distractions while eating (TV, computer, reading, driving, texting); aim for feelings and signs of satisfaction before fullness. Practice recognizing signs of satisfaction. New: aim 2 or more servings of non-starchy vegetables per day  Handouts Provided Include  Meal Ideas  RD's Notes for next Visit  Patient progress toward chosen goals.  Learning Style & Readiness for Change Teaching method utilized: Visual, Auditory, and hands on  Demonstrated degree of understanding via: Teach Back  Readiness Level: contemplation  Barriers to learning/adherence to lifestyle change: mobility; depression  RD's Notes for Next Visit Pt progress toward chosen goals.    MONITORING & EVALUATION Dietary intake, weekly physical activity, body weight, and preoperative behavioral change goals   Next Steps  Pt to follow up at [facility] in 2-3 weeks to assist pt with progressing through stages of change/further nutrition education.

## 2023-05-15 ENCOUNTER — Encounter: Payer: Self-pay | Admitting: Family Medicine

## 2023-05-16 ENCOUNTER — Other Ambulatory Visit: Payer: Self-pay | Admitting: Family Medicine

## 2023-05-16 MED ORDER — NYSTATIN 100000 UNIT/ML MT SUSP: 5.0000 mL | Freq: Four times a day (QID) | OROMUCOSAL | 0 refills | Status: AC

## 2023-05-23 ENCOUNTER — Ambulatory Visit (INDEPENDENT_AMBULATORY_CARE_PROVIDER_SITE_OTHER): Payer: 59 | Admitting: Licensed Clinical Social Worker

## 2023-05-23 DIAGNOSIS — F4310 Post-traumatic stress disorder, unspecified: Secondary | ICD-10-CM | POA: Diagnosis not present
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
## 2023-05-27 ENCOUNTER — Encounter: Payer: 59 | Attending: Surgery | Admitting: Dietician

## 2023-05-27 ENCOUNTER — Encounter: Payer: Self-pay | Admitting: Dietician

## 2023-05-27 VITALS — Ht 65.0 in | Wt >= 6400 oz

## 2023-05-27 DIAGNOSIS — E669 Obesity, unspecified: Secondary | ICD-10-CM

## 2023-05-27 DIAGNOSIS — Z713 Dietary counseling and surveillance: Secondary | ICD-10-CM | POA: Diagnosis not present
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
## 2023-06-06 NOTE — Telephone Encounter (Signed)
error 

## 2023-06-10 ENCOUNTER — Encounter: Payer: 59 | Admitting: Dietician

## 2023-06-10 ENCOUNTER — Encounter: Payer: Self-pay | Admitting: Dietician

## 2023-06-10 VITALS — Ht 65.0 in | Wt >= 6400 oz

## 2023-06-10 DIAGNOSIS — E669 Obesity, unspecified: Secondary | ICD-10-CM
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
## 2023-06-27 DIAGNOSIS — F419 Anxiety disorder, unspecified: Secondary | ICD-10-CM | POA: Diagnosis not present

## 2023-06-27 DIAGNOSIS — K760 Fatty (change of) liver, not elsewhere classified: Secondary | ICD-10-CM | POA: Diagnosis not present

## 2023-06-27 DIAGNOSIS — J45909 Unspecified asthma, uncomplicated: Secondary | ICD-10-CM | POA: Diagnosis not present

## 2023-06-27 DIAGNOSIS — F4310 Post-traumatic stress disorder, unspecified: Secondary | ICD-10-CM | POA: Diagnosis not present

## 2023-06-27 DIAGNOSIS — E538 Deficiency of other specified B group vitamins: Secondary | ICD-10-CM | POA: Diagnosis not present

## 2023-06-27 DIAGNOSIS — Z8659 Personal history of other mental and behavioral disorders: Secondary | ICD-10-CM | POA: Diagnosis not present

## 2023-06-27 DIAGNOSIS — G43809 Other migraine, not intractable, without status migrainosus: Secondary | ICD-10-CM | POA: Diagnosis not present

## 2023-06-27 DIAGNOSIS — Z09 Encounter for follow-up examination after completed treatment for conditions other than malignant neoplasm: Secondary | ICD-10-CM | POA: Diagnosis not present

## 2023-06-27 DIAGNOSIS — A048 Other specified bacterial intestinal infections: Secondary | ICD-10-CM | POA: Diagnosis not present

## 2023-06-27 DIAGNOSIS — E282 Polycystic ovarian syndrome: Secondary | ICD-10-CM | POA: Diagnosis not present

## 2023-06-27 DIAGNOSIS — E88819 Insulin resistance, unspecified: Secondary | ICD-10-CM | POA: Diagnosis not present

## 2023-07-01 ENCOUNTER — Other Ambulatory Visit (INDEPENDENT_AMBULATORY_CARE_PROVIDER_SITE_OTHER): Payer: Self-pay | Admitting: Family Medicine

## 2023-07-01 DIAGNOSIS — E559 Vitamin D deficiency, unspecified: Secondary | ICD-10-CM

## 2023-07-03 ENCOUNTER — Ambulatory Visit (INDEPENDENT_AMBULATORY_CARE_PROVIDER_SITE_OTHER): Payer: 59 | Admitting: Family Medicine

## 2023-07-10 ENCOUNTER — Telehealth: Payer: Self-pay | Admitting: Family Medicine

## 2023-07-10 NOTE — Telephone Encounter (Signed)
Folic Acid not on current list, routing for approval.

## 2023-07-10 NOTE — Telephone Encounter (Signed)
Medication Refill - Medication: Folic Acid   Says her surgeon will not fill this so she needs her PCP  Has the patient contacted their pharmacy? Yes.   (Agent: If no, request that the patient contact the pharmacy for the refill. If patient does not wish to contact the pharmacy document the reason why and proceed with request.) (Agent: If yes, when and what did the pharmacy advise?)  Preferred Pharmacy (with phone number or street name):  [facility] - [place], [place] - [address]  [address] [place] [address]  Phone: [phone] Fax: [phone]   Has the patient been seen for an appointment in the last year OR does the patient have an upcoming appointment? Yes.    Agent: Please be advised that RX refills may take up to 3 business days. We ask that you follow-up with your pharmacy.

## 2023-07-11 NOTE — Telephone Encounter (Signed)
Patient was prescribed this medication by another provider within the [facility] and states she has asked her to resend the Folic Acid several times but she hasn't. She got tired of  asking that provider so she decided to ask Dr. Alvis Lemmings.   Advised patient there was not lab test for Dr. Alvis Lemmings to base the dosage off and would need to discuss with Dr. Alvis Lemmings at her appointment on 07/29/2023 at 1:30.   Patient thought the labs were done in a panel  recently along with a H. Pylori test but advised that those results were not visible from this system and weren't done.  Patient verbalized understanding.    Patient is now asking about a letter for an emotional support pet to give to her landlord. Please advise.

## 2023-07-11 NOTE — Telephone Encounter (Signed)
Letter has been sent via MyChart

## 2023-07-16 ENCOUNTER — Telehealth: Payer: Self-pay

## 2023-07-16 NOTE — Telephone Encounter (Signed)
Routing to PCP for review.

## 2023-07-16 NOTE — Telephone Encounter (Signed)
Copied from CRM [id number]. Topic: General - Other >> Jul 10, 2023  2:54 PM Franchot Heidelberg wrote: Reason for CRM: Pt called requesting emotional support companion documentation. Needs this for her landlord. Says this is needed for her mental health.

## 2023-07-16 NOTE — Telephone Encounter (Signed)
I already did this for her 5 days ago and sent it to her via MyChart.

## 2023-07-16 NOTE — Telephone Encounter (Signed)
Noted  

## 2023-07-23 ENCOUNTER — Encounter (INDEPENDENT_AMBULATORY_CARE_PROVIDER_SITE_OTHER): Payer: Self-pay | Admitting: Family Medicine

## 2023-07-23 ENCOUNTER — Ambulatory Visit (INDEPENDENT_AMBULATORY_CARE_PROVIDER_SITE_OTHER): Payer: 59 | Admitting: Family Medicine

## 2023-07-23 VITALS — BP 132/82 | HR 107 | Temp 99.6°F | Ht 65.0 in | Wt >= 6400 oz

## 2023-07-23 DIAGNOSIS — Z6841 Body Mass Index (BMI) 40.0 and over, adult: Secondary | ICD-10-CM | POA: Diagnosis not present

## 2023-07-23 DIAGNOSIS — E538 Deficiency of other specified B group vitamins: Secondary | ICD-10-CM | POA: Insufficient documentation

## 2023-07-23 DIAGNOSIS — F419 Anxiety disorder, unspecified: Secondary | ICD-10-CM | POA: Insufficient documentation

## 2023-07-23 DIAGNOSIS — E559 Vitamin D deficiency, unspecified: Secondary | ICD-10-CM

## 2023-07-23 DIAGNOSIS — E782 Mixed hyperlipidemia: Secondary | ICD-10-CM

## 2023-07-23 DIAGNOSIS — E669 Obesity, unspecified: Secondary | ICD-10-CM | POA: Diagnosis not present

## 2023-07-23 DIAGNOSIS — E785 Hyperlipidemia, unspecified: Secondary | ICD-10-CM | POA: Diagnosis not present

## 2023-07-23 DIAGNOSIS — F32A Depression, unspecified: Secondary | ICD-10-CM

## 2023-07-23 DIAGNOSIS — F331 Major depressive disorder, recurrent, moderate: Secondary | ICD-10-CM

## 2023-07-23 MED ORDER — ROSUVASTATIN CALCIUM 20 MG PO TABS
20.0000 mg | ORAL_TABLET | Freq: Every day | ORAL | 0 refills | Status: DC
Start: 2023-07-23 — End: 2023-10-10

## 2023-07-23 MED ORDER — EZETIMIBE 10 MG PO TABS
10.0000 mg | ORAL_TABLET | Freq: Every day | ORAL | 0 refills | Status: DC
Start: 2023-07-23 — End: 2023-10-10

## 2023-07-23 MED ORDER — ROSUVASTATIN CALCIUM 20 MG PO TABS
20.0000 mg | ORAL_TABLET | Freq: Every day | ORAL | 0 refills | Status: DC
Start: 2023-07-23 — End: 2023-07-23

## 2023-07-23 MED ORDER — SERTRALINE HCL 100 MG PO TABS
ORAL_TABLET | ORAL | 1 refills | Status: DC
Start: 2023-07-23 — End: 2023-10-10

## 2023-07-23 MED ORDER — PRENATAL VITAMINS 27-0.8 MG PO TABS
1.0000 | ORAL_TABLET | Freq: Every day | ORAL | Status: AC
Start: 2023-07-23 — End: ?

## 2023-07-23 NOTE — Addendum Note (Signed)
Addended by: Kirke Corin A on: 07/23/2023 04:01 PM   Modules accepted: Orders

## 2023-07-23 NOTE — Assessment & Plan Note (Signed)
Patient experiencing no side effects of zetia.  She got labs done 4 months ago.  Her triglycerides and LDL were elevated and HDL was low.  She needs a refill of Zetia and needs repeat labs in the next few months.

## 2023-07-23 NOTE — Assessment & Plan Note (Signed)
She never received a Rx of folic acid and her last folic acid level was <2.0.  She is wondering if she needs a prescription.  She was encouraged to start a prenatal vitamin for her deficiency.  Will retest with lab test in 3 months.  Med list updated with OTC prenatal vitamin.

## 2023-07-23 NOTE — Addendum Note (Signed)
Addended by: Langston Reusing on: 07/23/2023 12:22 PM   Modules accepted: Orders

## 2023-07-23 NOTE — Assessment & Plan Note (Signed)
Patient doing reasonably on bupropion and sertraline.  On 150mg  wellbutrin and 100mg  of zoloft. No suicidal or homicidal ideation.  She needs refills of her medications today.

## 2023-07-23 NOTE — Assessment & Plan Note (Signed)
Last Vitamin D level was low at 25.  She is on Rx Vitamin D.  She needs refill today.  No nausea, vomiting or muscle weakness mentioned.

## 2023-07-23 NOTE — Progress Notes (Addendum)
[facility] MEDICAL WEIGHT [facility] HEALTHY WEIGHT & WELLNESS AT [place] [address] [place] [address] Dept: [phone] Dept Fax: [phone]  SUBJECTIVE:  Chief Complaint: Obesity  Interim History: Patient was denied bariatric surgery due to concerns about binge eating.  Her surgeon does not feel comfortable doing surgery.  Patient was instructed to seek a different treatment away from this clinic.  She is very upset today and isn't sure what else she can do.  She has been mentally prepared to get surgery. She mentions she was told she likely wouldn't be able to keep up with the follow up and the requirements post operatively.   Chloe Pena is here to discuss her progress with her obesity treatment plan. She is on the practicing portion control and making smarter food choices, such as increasing vegetables and decreasing simple carbohydrates and states she is following her eating plan approximately 85 % of the time. She states she is not exercising.   OBJECTIVE: Visit Diagnoses: Problem List Items Addressed This Visit       Other   Hyperlipidemia    Patient experiencing no side effects of zetia.  She got labs done 4 months ago.  Her triglycerides and LDL were elevated and HDL was low.  She needs a refill of Zetia and needs repeat labs in the next few months.      Relevant Medications   ezetimibe (ZETIA) 10 MG tablet   rosuvastatin (CRESTOR) 20 MG tablet   Vitamin D deficiency - Primary    Last Vitamin D level was low at 25.  She is on Rx Vitamin D.  She needs refill today.  No nausea, vomiting or muscle weakness mentioned.       Anxiety and depression    Patient doing reasonably on bupropion and sertraline.  On 150mg  wellbutrin and 100mg  of zoloft. No suicidal or homicidal ideation.  She needs refills of her medications today.        Relevant Medications   sertraline (ZOLOFT) 100 MG tablet   Folic acid deficiency    She never received a Rx of folic acid  and her last folic acid level was <2.0.  She is wondering if she needs a prescription.  She was encouraged to start a prenatal vitamin for her deficiency.  Will retest with lab test in 3 months.  Med list updated with OTC prenatal vitamin.      Relevant Medications   Prenatal Vit-Fe Fumarate-FA (PRENATAL VITAMINS) 27-0.8 MG TABS   Other Visit Diagnoses     Moderate episode of recurrent major depressive disorder (HCC)       Relevant Medications   sertraline (ZOLOFT) 100 MG tablet       Vitals Temp: 99.6 F (37.6 C) BP: 132/82 Pulse Rate: (!) 107 SpO2: 97 %   Anthropometric Measurements Height: 5\' 5"  (1.651 m) Weight: (!) 420 lb (190.5 kg) BMI (Calculated): 69.89 Weight at Last Visit: 421 lb Weight Lost Since Last Visit: 1 Weight Gained Since Last Visit: 0 Starting Weight: 349 lbs Total Weight Loss (lbs): 0 lb (0 kg)   Body Composition  Body Fat %: 61.9 % Fat Mass (lbs): 260.2 lbs Muscle Mass (lbs): 152.2 lbs Visceral Fat Rating : 29   Other Clinical Data Today's Visit #: 28 Starting Date: 06/15/21     ASSESSMENT AND PLAN:  Diet: Chloe Pena is currently in the action stage of change. As such, her goal is to continue with weight loss efforts. She has agreed to practicing portion  control and making smarter food choices, such as increasing vegetables and decreasing simple carbohydrates.  She agreed to keep a food log 4 days a week every week until the next appointment to discuss total calories and protein intake.  She thinks she would rather write this down than log it electronically.   Exercise: Chloe Pena has been instructed that some exercise is better than none for weight loss and overall health benefits.   Behavior Modification:  We discussed the following Behavioral Modification Strategies today: increasing lean protein intake, meal planning and cooking strategies, and holiday eating strategies.  Will refer to [facility] Bariatric Surgery program.   No follow-ups on  file.Marland Kitchen She was informed of the importance of frequent follow up visits to maximize her success with intensive lifestyle modifications for her multiple health conditions.  Attestation Statements:   Reviewed by clinician on day of visit: allergies, medications, problem list, medical history, surgical history, family history, social history, and previous encounter notes.   Time spent on visit including pre-visit chart review and post-visit care and charting was 35 minutes.    Reuben Likes, MD

## 2023-07-23 NOTE — Addendum Note (Signed)
Addended by: Kirke Corin A on: 07/23/2023 12:50 PM   Modules accepted: Orders

## 2023-07-29 ENCOUNTER — Ambulatory Visit: Payer: 59 | Admitting: Family Medicine

## 2023-09-03 ENCOUNTER — Telehealth (INDEPENDENT_AMBULATORY_CARE_PROVIDER_SITE_OTHER): Payer: 59 | Admitting: Family Medicine

## 2023-09-03 DIAGNOSIS — R7303 Prediabetes: Secondary | ICD-10-CM | POA: Diagnosis not present

## 2023-09-03 DIAGNOSIS — E669 Obesity, unspecified: Secondary | ICD-10-CM

## 2023-09-03 DIAGNOSIS — Z6841 Body Mass Index (BMI) 40.0 and over, adult: Secondary | ICD-10-CM

## 2023-09-03 DIAGNOSIS — E559 Vitamin D deficiency, unspecified: Secondary | ICD-10-CM

## 2023-09-03 MED ORDER — VITAMIN D (ERGOCALCIFEROL) 1.25 MG (50000 UNIT) PO CAPS
50000.0000 [IU] | ORAL_CAPSULE | ORAL | 0 refills | Status: DC
Start: 2023-09-03 — End: 2023-10-10

## 2023-09-03 NOTE — Assessment & Plan Note (Signed)
 Discussed importance of vitamin d supplementation.  Vitamin d supplementation has been shown to decrease fatigue, decrease risk of progression to insulin resistance and then prediabetes, decreases risk of falling in older age and can even assist in decreasing depressive symptoms in PTSD.   Prescription for Vitamin D sent in.

## 2023-09-03 NOTE — Assessment & Plan Note (Signed)
Last A1c of 6.2 and Insulin over 100.  She is on metformin.  Previously on injectable medication but couldn't afford it.  Needs repeat labs done at next appointment.
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
## 2023-09-30 ENCOUNTER — Other Ambulatory Visit: Payer: Self-pay | Admitting: Family Medicine

## 2023-09-30 DIAGNOSIS — F32A Depression, unspecified: Secondary | ICD-10-CM

## 2023-09-30 DIAGNOSIS — G43709 Chronic migraine without aura, not intractable, without status migrainosus: Secondary | ICD-10-CM

## 2023-09-30 DIAGNOSIS — F331 Major depressive disorder, recurrent, moderate: Secondary | ICD-10-CM

## 2023-10-10 ENCOUNTER — Encounter (INDEPENDENT_AMBULATORY_CARE_PROVIDER_SITE_OTHER): Payer: Self-pay | Admitting: Family Medicine

## 2023-10-10 ENCOUNTER — Ambulatory Visit (INDEPENDENT_AMBULATORY_CARE_PROVIDER_SITE_OTHER): Payer: 59 | Admitting: Family Medicine

## 2023-10-10 VITALS — BP 140/81 | HR 101 | Temp 98.8°F | Ht 65.0 in | Wt >= 6400 oz

## 2023-10-10 DIAGNOSIS — E785 Hyperlipidemia, unspecified: Secondary | ICD-10-CM | POA: Diagnosis not present

## 2023-10-10 DIAGNOSIS — F32A Depression, unspecified: Secondary | ICD-10-CM

## 2023-10-10 DIAGNOSIS — R7303 Prediabetes: Secondary | ICD-10-CM | POA: Diagnosis not present

## 2023-10-10 DIAGNOSIS — Z6841 Body Mass Index (BMI) 40.0 and over, adult: Secondary | ICD-10-CM

## 2023-10-10 DIAGNOSIS — F419 Anxiety disorder, unspecified: Secondary | ICD-10-CM | POA: Diagnosis not present

## 2023-10-10 DIAGNOSIS — E782 Mixed hyperlipidemia: Secondary | ICD-10-CM | POA: Diagnosis not present

## 2023-10-10 DIAGNOSIS — E559 Vitamin D deficiency, unspecified: Secondary | ICD-10-CM | POA: Diagnosis not present

## 2023-10-10 DIAGNOSIS — E669 Obesity, unspecified: Secondary | ICD-10-CM | POA: Diagnosis not present

## 2023-10-10 MED ORDER — VITAMIN D (ERGOCALCIFEROL) 1.25 MG (50000 UNIT) PO CAPS
50000.0000 [IU] | ORAL_CAPSULE | ORAL | 0 refills | Status: DC
Start: 2023-10-10 — End: 2023-11-14

## 2023-10-10 MED ORDER — ROSUVASTATIN CALCIUM 20 MG PO TABS
20.0000 mg | ORAL_TABLET | Freq: Every day | ORAL | 0 refills | Status: DC
Start: 2023-10-10 — End: 2024-01-28

## 2023-10-10 MED ORDER — EZETIMIBE 10 MG PO TABS
10.0000 mg | ORAL_TABLET | Freq: Every day | ORAL | 0 refills | Status: DC
Start: 2023-10-10 — End: 2024-01-28

## 2023-10-10 MED ORDER — SERTRALINE HCL 100 MG PO TABS
ORAL_TABLET | ORAL | 1 refills | Status: DC
Start: 2023-10-10 — End: 2024-04-14

## 2023-10-10 NOTE — Progress Notes (Signed)
SUBJECTIVE:  Chief Complaint: Obesity  Interim History: Patient has a headache and is thinking her glasses are too old to be functional.  She mentions she has an appointment tomorrow.  The surgeon that was recommended at last appointment did not take her insurance so she feels stuck.  She is wondering whether or not she should go back to [facility] for re- evaluation.  Patient is still working thru 2 incomplete classes and figuring out what she needs to do in order to get the work done to graduate.  She is still focusing on increasing total protein intake.  Restaurant supply store is now offering delivery and she is finding that to be helpful to increase her protein and is cost effective. She portioning out meat when she is getting a bulk order to only have thaw out food when its time to cook.  Mickel Crow is here to discuss her progress with her obesity treatment plan. She is on the practicing portion control and making smarter food choices, such as increasing vegetables and decreasing simple carbohydrates and states she is following her eating plan approximately 95 % of the time. She states she is active at home.   OBJECTIVE: Visit Diagnoses: Problem List Items Addressed This Visit       Other   BMI 60.0-69.9, adult (HCC)   Hyperlipidemia - Primary   Patient is on combination medications of statin and zetia.  She is fasting for repeat labs today.  Will discuss labs at next appointment.      Relevant Medications   rosuvastatin (CRESTOR) 20 MG tablet   ezetimibe (ZETIA) 10 MG tablet   Other Relevant Orders   Lipid Panel With LDL/HDL Ratio (Completed)   Vitamin D deficiency   Pateint has been consistent with her vitamin d supplementation but still not feeling much change in energy level.  Will repeat Vitamin D at this time- Patient may need to switch formulations if ergocalciferol not leading to levels increasing.      Relevant Medications   Vitamin D, Ergocalciferol,  (DRISDOL) 1.25 MG (50000 UNIT) CAPS capsule   Other Relevant Orders   VITAMIN D 25 Hydroxy (Vit-D Deficiency, Fractures) (Completed)   Prediabetes   Patient diet is still slightly heavier in carbs due to what fits into the budget she and her mother have for food.  She buys in bulk when she can and is trying to be cautious of macronutrient ratios.  Last A1c and Insulin elevated and will repeat labs today.      Relevant Orders   Comprehensive metabolic panel (Completed)   Hemoglobin A1c (Completed)   Insulin, random (Completed)   Anxiety and depression   Occasional symptoms but normally well managed on sertraline 100mg  daily.  Will continue this and refill it today.      Relevant Medications   sertraline (ZOLOFT) 100 MG tablet   Other Visit Diagnoses       Obesity with starting BMI of 58.0           No data recorded No data recorded  No data recorded No data recorded    ASSESSMENT AND PLAN:  Diet: Mickel Crow is currently in the action stage of change. As such, her goal is to continue with weight loss efforts. She has agreed to Category 4 Plan or 1650-1800 calories and 125 or more grams of protein daily.  She agrees to at least log 2-3 days a week and make her best estimation of quantity when she is cooking or  baking from scratch. The importance of having the log to be able to see where she is ending calorically and macro wise was discussed again today.    Exercise: Mickel Crow has been instructed that some exercise is better than none for weight loss and overall health benefits.   Behavior Modification:  We discussed the following Behavioral Modification Strategies today: increasing lean protein intake, increasing vegetables, meal planning and cooking strategies, and planning for success.   No follow-ups on file.Marland Kitchen She was informed of the importance of frequent follow up visits to maximize her success with intensive lifestyle modifications for her multiple health  conditions.  Attestation Statements:   Reviewed by clinician on day of visit: allergies, medications, problem list, medical history, surgical history, family history, social history, and previous encounter notes.      Reuben Likes, MD

## 2023-10-11 LAB — COMPREHENSIVE METABOLIC PANEL
ALT: 98 [IU]/L — ABNORMAL HIGH (ref 0–32)
AST: 61 [IU]/L — ABNORMAL HIGH (ref 0–40)
Albumin: 4.5 g/dL (ref 4.0–5.0)
Alkaline Phosphatase: 131 [IU]/L — ABNORMAL HIGH (ref 44–121)
BUN/Creatinine Ratio: 15 (ref 9–23)
BUN: 13 mg/dL (ref 6–20)
Bilirubin Total: 1.1 mg/dL (ref 0.0–1.2)
CO2: 20 mmol/L (ref 20–29)
Calcium: 9.1 mg/dL (ref 8.7–10.2)
Chloride: 101 mmol/L (ref 96–106)
Creatinine, Ser: 0.84 mg/dL (ref 0.57–1.00)
Globulin, Total: 2.8 g/dL (ref 1.5–4.5)
Glucose: 86 mg/dL (ref 70–99)
Potassium: 4.6 mmol/L (ref 3.5–5.2)
Sodium: 139 mmol/L (ref 134–144)
Total Protein: 7.3 g/dL (ref 6.0–8.5)
eGFR: 96 mL/min/{1.73_m2} (ref >59)

## 2023-10-11 LAB — LIPID PANEL WITH LDL/HDL RATIO
Cholesterol, Total: 174 mg/dL (ref 100–199)
HDL: 39 mg/dL — ABNORMAL LOW (ref >39)
LDL Chol Calc (NIH): 113 mg/dL — ABNORMAL HIGH (ref 0–99)
LDL/HDL Ratio: 2.9 {ratio} (ref 0.0–3.2)
Triglycerides: 121 mg/dL (ref 0–149)
VLDL Cholesterol Cal: 22 mg/dL (ref 5–40)

## 2023-10-11 LAB — INSULIN, RANDOM: INSULIN: 54.9 u[IU]/mL — ABNORMAL HIGH (ref 2.6–24.9)

## 2023-10-11 LAB — VITAMIN D 25 HYDROXY (VIT D DEFICIENCY, FRACTURES): Vit D, 25-Hydroxy: 28 ng/mL — ABNORMAL LOW (ref 30.0–100.0)

## 2023-10-11 LAB — HEMOGLOBIN A1C
Est. average glucose Bld gHb Est-mCnc: 134 mg/dL
Hgb A1c MFr Bld: 6.3 % — ABNORMAL HIGH (ref 4.8–5.6)

## 2023-10-13 NOTE — Assessment & Plan Note (Signed)
Occasional symptoms but normally well managed on sertraline 100mg  daily.  Will continue this and refill it today.

## 2023-10-13 NOTE — Assessment & Plan Note (Signed)
Pateint has been consistent with her vitamin d supplementation but still not feeling much change in energy level.  Will repeat Vitamin D at this time- Patient may need to switch formulations if ergocalciferol not leading to levels increasing.

## 2023-10-13 NOTE — Assessment & Plan Note (Signed)
Patient is on combination medications of statin and zetia.  She is fasting for repeat labs today.  Will discuss labs at next appointment.

## 2023-10-13 NOTE — Assessment & Plan Note (Addendum)
Patient diet is still slightly heavier in carbs due to what fits into the budget she and her mother have for food.  She buys in bulk when she can and is trying to be cautious of macronutrient ratios.  Last A1c and Insulin elevated and will repeat labs today.

## 2023-11-11 ENCOUNTER — Other Ambulatory Visit (INDEPENDENT_AMBULATORY_CARE_PROVIDER_SITE_OTHER): Payer: Self-pay | Admitting: Family Medicine

## 2023-11-11 DIAGNOSIS — E559 Vitamin D deficiency, unspecified: Secondary | ICD-10-CM

## 2023-11-14 ENCOUNTER — Encounter (INDEPENDENT_AMBULATORY_CARE_PROVIDER_SITE_OTHER): Payer: Self-pay | Admitting: Family Medicine

## 2023-11-14 ENCOUNTER — Ambulatory Visit (INDEPENDENT_AMBULATORY_CARE_PROVIDER_SITE_OTHER): Payer: 59 | Admitting: Family Medicine

## 2023-11-14 VITALS — BP 130/89 | HR 90 | Temp 98.4°F | Ht 65.0 in | Wt >= 6400 oz

## 2023-11-14 DIAGNOSIS — E669 Obesity, unspecified: Secondary | ICD-10-CM

## 2023-11-14 DIAGNOSIS — E559 Vitamin D deficiency, unspecified: Secondary | ICD-10-CM

## 2023-11-14 DIAGNOSIS — Z6841 Body Mass Index (BMI) 40.0 and over, adult: Secondary | ICD-10-CM | POA: Diagnosis not present

## 2023-11-14 DIAGNOSIS — R7303 Prediabetes: Secondary | ICD-10-CM | POA: Diagnosis not present

## 2023-11-14 MED ORDER — METFORMIN HCL 500 MG PO TABS: 500.0000 mg | ORAL_TABLET | Freq: Every day | ORAL | 0 refills | Status: DC

## 2023-11-14 MED ORDER — VITAMIN D (ERGOCALCIFEROL) 1.25 MG (50000 UNIT) PO CAPS
50000.0000 [IU] | ORAL_CAPSULE | ORAL | 0 refills | Status: DC
Start: 2023-11-14 — End: 2024-03-09
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
## 2023-11-25 NOTE — Assessment & Plan Note (Signed)
 Patient doing well on metformin- needs a refill today.  No GI side effects mentioned.  Will repeat labs in 2-3 months.

## 2023-11-25 NOTE — Assessment & Plan Note (Signed)
 Last Vitamin D level well below goal.  Needs refill of prescription strength Vitamin D.  Will repeat level in June 2025.

## 2023-11-25 NOTE — Assessment & Plan Note (Signed)
 Anthropometric Measurements Height: 5\' 5"  (1.651 m) Weight: (!) 420 lb (190.5 kg) BMI (Calculated): 69.89 Weight at Last Visit: 420 lb Weight Lost Since Last Visit: 0 Weight Gained Since Last Visit: 0 Starting Weight: 349 lb Total Weight Loss (lbs): 0 lb (0 kg)  Body Composition  Body Fat %: 62.1 % Fat Mass (lbs): 260.8 lbs Muscle Mass (lbs): 151.2 lbs Visceral Fat Rating : 29  Other Clinical Data Today's Visit #: 31 Starting Date: 06/15/21 Comments: Cat 4, 1650-1800/125 g

## 2023-12-19 ENCOUNTER — Ambulatory Visit (INDEPENDENT_AMBULATORY_CARE_PROVIDER_SITE_OTHER): Payer: Self-pay | Admitting: Family Medicine

## 2023-12-26 ENCOUNTER — Encounter (INDEPENDENT_AMBULATORY_CARE_PROVIDER_SITE_OTHER): Payer: Self-pay

## 2023-12-26 DIAGNOSIS — K760 Fatty (change of) liver, not elsewhere classified: Secondary | ICD-10-CM | POA: Diagnosis not present

## 2023-12-26 DIAGNOSIS — E282 Polycystic ovarian syndrome: Secondary | ICD-10-CM | POA: Diagnosis not present

## 2023-12-26 DIAGNOSIS — G43809 Other migraine, not intractable, without status migrainosus: Secondary | ICD-10-CM | POA: Diagnosis not present

## 2023-12-26 DIAGNOSIS — R7401 Elevation of levels of liver transaminase levels: Secondary | ICD-10-CM | POA: Diagnosis not present

## 2023-12-26 DIAGNOSIS — E88819 Insulin resistance, unspecified: Secondary | ICD-10-CM | POA: Diagnosis not present

## 2023-12-26 DIAGNOSIS — E559 Vitamin D deficiency, unspecified: Secondary | ICD-10-CM | POA: Diagnosis not present

## 2023-12-26 DIAGNOSIS — Z8659 Personal history of other mental and behavioral disorders: Secondary | ICD-10-CM | POA: Diagnosis not present

## 2024-01-02 ENCOUNTER — Other Ambulatory Visit (INDEPENDENT_AMBULATORY_CARE_PROVIDER_SITE_OTHER): Payer: Self-pay | Admitting: Family Medicine

## 2024-01-02 DIAGNOSIS — E782 Mixed hyperlipidemia: Secondary | ICD-10-CM

## 2024-01-10 ENCOUNTER — Other Ambulatory Visit: Payer: Self-pay | Admitting: Family Medicine

## 2024-01-10 DIAGNOSIS — G43709 Chronic migraine without aura, not intractable, without status migrainosus: Secondary | ICD-10-CM

## 2024-01-28 ENCOUNTER — Telehealth (INDEPENDENT_AMBULATORY_CARE_PROVIDER_SITE_OTHER): Payer: Self-pay | Admitting: Family Medicine

## 2024-01-28 ENCOUNTER — Encounter (INDEPENDENT_AMBULATORY_CARE_PROVIDER_SITE_OTHER): Payer: Self-pay | Admitting: Family Medicine

## 2024-01-28 DIAGNOSIS — E785 Hyperlipidemia, unspecified: Secondary | ICD-10-CM

## 2024-01-28 DIAGNOSIS — F32A Depression, unspecified: Secondary | ICD-10-CM

## 2024-01-28 DIAGNOSIS — E669 Obesity, unspecified: Secondary | ICD-10-CM

## 2024-01-28 DIAGNOSIS — Z6841 Body Mass Index (BMI) 40.0 and over, adult: Secondary | ICD-10-CM

## 2024-01-28 DIAGNOSIS — E782 Mixed hyperlipidemia: Secondary | ICD-10-CM

## 2024-01-28 DIAGNOSIS — F331 Major depressive disorder, recurrent, moderate: Secondary | ICD-10-CM

## 2024-01-28 DIAGNOSIS — F419 Anxiety disorder, unspecified: Secondary | ICD-10-CM

## 2024-01-28 MED ORDER — ROSUVASTATIN CALCIUM 20 MG PO TABS
20.0000 mg | ORAL_TABLET | Freq: Every day | ORAL | 0 refills | Status: DC
Start: 2024-01-28 — End: 2024-04-14

## 2024-01-28 MED ORDER — BUPROPION HCL ER (XL) 150 MG PO TB24
150.0000 mg | ORAL_TABLET | Freq: Every day | ORAL | 1 refills | Status: DC
Start: 2024-01-28 — End: 2024-04-14

## 2024-01-28 MED ORDER — EZETIMIBE 10 MG PO TABS
10.0000 mg | ORAL_TABLET | Freq: Every day | ORAL | 0 refills | Status: DC
Start: 2024-01-28 — End: 2024-04-14

## 2024-01-28 NOTE — Progress Notes (Signed)
 TeleHealth Visit:   Chloe Pena has verbally consented to this TeleHealth visit. The patient is located at home, the provider is located at the [facility] office. The participants in this visit include the listed provider and patient . The visit was conducted today via MyChart.    Chief Complaint: OBESITY Chloe Pena is here to discuss her progress with her obesity treatment plan along with follow-up of her obesity related diagnoses. Chloe Pena is on keeping a food journal and adhering to recommended goals of 1700-1850z calories and 125 grams of protein and states she is following her eating plan approximately 100% of the time. Chloe Pena states she is working 4 hours 7 times per week.  No data recorded Anthropometric Measurements Height: 5\' 5"  (1.651 m) Weight at Last Visit: 420 lb   No data recorded Other Clinical Data Today's Visit #: 56 Starting Date: 06/15/21 Comments: 1700-1800/125  virtual visit    Reported Weight:none reported   Subjective:  Patient voices that she was seen by [facility] for evaluation for bariatric surgery and is going through psych evaluation again.  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  She doesn't feel like her stress has really affected her eating habits or exercise level.  She is feeling more frustrated with issues of getting thru bariatric program.  She is working on increasing total protein intake and getting a significant amount of vegetables in.  Assessment/Plan:   Problem List Items Addressed This Visit       Other   Hyperlipidemia   Elevated LDL previously in January.  On zetia  and statin. Needs refills of zetia  and statin today- prescriptions sent in.      Relevant Medications   rosuvastatin  (CRESTOR ) 20 MG tablet   ezetimibe  (ZETIA ) 10 MG tablet   Anxiety and depression   Doing reasonably well on wellbutrin .  Needs a refill today.  No side effects mentioned.  Patient is to  undergo repeat psych evaluation for bariatric surgery.      Relevant Medications   buPROPion  (WELLBUTRIN  XL) 150 MG 24 hr tablet   Other Visit Diagnoses       Obesity with starting BMI of 58.0    -  Primary     Moderate episode of recurrent major depressive disorder (HCC)       Relevant Medications   buPROPion  (WELLBUTRIN  XL) 150 MG 24 hr tablet       There are no diagnoses linked to this encounter.  Chloe Pena is currently in the action stage of change. As such, her goal is to continue with weight loss efforts and has agreed to keeping a food journal and adhering to recommended goals of 1700-1850 calories and 125 or more grams of protein.   Exercise goals: For substantial health benefits, adults should do at least 150 minutes (2 hours and 30 minutes) a week of moderate-intensity, or 75 minutes (1 hour and 15 minutes) a week of vigorous-intensity aerobic physical activity, or an equivalent combination of moderate- and vigorous-intensity aerobic activity. Aerobic activity should be performed in episodes of at least 10 minutes, and preferably, it should be spread throughout the week.  Behavioral modification strategies: increasing lean protein intake, decreasing simple carbohydrates, increasing vegetables, meal planning and cooking strategies, and keeping healthy foods in the home.  Chloe Pena has agreed to follow-up with our clinic in 5 weeks. She was informed of the importance of frequent follow-up visits to maximize her success with intensive lifestyle  modifications for her multiple health conditions.   Objective:   VITALS: Per patient if applicable, see vitals. GENERAL: Alert and in no acute distress. CARDIOPULMONARY: No increased WOB. Speaking in clear sentences.  PSYCH: Pleasant and cooperative. Speech normal rate and rhythm. Affect is appropriate. Insight and judgement are appropriate. Attention is focused, linear, and appropriate.  NEURO: Oriented as arrived to appointment on time with  no prompting.   Lab Results  Component Value Date   CREATININE 0.84 10/10/2023   BUN 13 10/10/2023   NA 139 10/10/2023   K 4.6 10/10/2023   CL 101 10/10/2023   CO2 20 10/10/2023   Lab Results  Component Value Date   ALT 98 (H) 10/10/2023   AST 61 (H) 10/10/2023   ALKPHOS 131 (H) 10/10/2023   BILITOT 1.1 10/10/2023   Lab Results  Component Value Date   HGBA1C 6.3 (H) 10/10/2023   HGBA1C 6.2 (H) 03/13/2023   HGBA1C 5.8 (H) 05/28/2022   HGBA1C 5.5 06/22/2021   HGBA1C 5.6 03/13/2021   Lab Results  Component Value Date   INSULIN  54.9 (H) 10/10/2023   INSULIN  104.0 (H) 03/13/2023   INSULIN  91.7 (H) 05/28/2022   INSULIN  44.1 (H) 06/22/2021   Lab Results  Component Value Date   TSH 2.200 06/22/2021   Lab Results  Component Value Date   CHOL 174 10/10/2023   HDL 39 (L) 10/10/2023   LDLCALC 113 (H) 10/10/2023   TRIG 121 10/10/2023   CHOLHDL 7.3 04/06/2020   Lab Results  Component Value Date   VD25OH 28.0 (L) 10/10/2023   VD25OH 25.7 (L) 03/13/2023   VD25OH 26.8 (L) 05/28/2022   Lab Results  Component Value Date   WBC 10.8 02/14/2022   HGB 13.6 02/14/2022   HCT 39.8 02/14/2022   MCV 88 02/14/2022   PLT 326 02/14/2022   No results found for: "IRON", "TIBC", "FERRITIN"  Attestation Statements:   Reviewed by clinician on day of visit: allergies, medications, problem list, medical history, surgical history, family history, social history, and previous encounter notes.    Donaciano Frizzle, MD

## 2024-02-01 ENCOUNTER — Other Ambulatory Visit: Payer: Self-pay | Admitting: Family Medicine

## 2024-02-01 ENCOUNTER — Other Ambulatory Visit (INDEPENDENT_AMBULATORY_CARE_PROVIDER_SITE_OTHER): Payer: Self-pay | Admitting: Family Medicine

## 2024-02-01 DIAGNOSIS — G43709 Chronic migraine without aura, not intractable, without status migrainosus: Secondary | ICD-10-CM

## 2024-02-01 DIAGNOSIS — R7303 Prediabetes: Secondary | ICD-10-CM

## 2024-02-03 ENCOUNTER — Ambulatory Visit (INDEPENDENT_AMBULATORY_CARE_PROVIDER_SITE_OTHER): Admitting: Licensed Clinical Social Worker

## 2024-02-03 ENCOUNTER — Encounter (HOSPITAL_COMMUNITY): Payer: Self-pay

## 2024-02-03 DIAGNOSIS — Z8659 Personal history of other mental and behavioral disorders: Secondary | ICD-10-CM

## 2024-02-03 DIAGNOSIS — F4322 Adjustment disorder with anxiety: Secondary | ICD-10-CM | POA: Diagnosis not present

## 2024-02-04 NOTE — Progress Notes (Signed)
 Comprehensive Clinical Assessment (CCA) Note  02/04/2024 Chloe Pena [id number]  Chief Complaint:  Chief Complaint  Patient presents with   Obesity   Visit Diagnosis: Adjustment disorder with anxious mood  History of posttraumatic stress disorder (PTSD)      CCA Biopsychosocial Intake/Chief Complaint:  Bariatric  Current Symptoms/Problems: Anxiety: can feel anxious, worried, or nervous at times, mother talks about dying which could triggers patient, patient has difficulty with motivation at time,  Mood: can isolate, can have periods of depressive symptoms, irritability at times, problems with memory, can feel a palpation, has a history of trauma in childhood, appetite flucuates, gained 30 lbs in the last 6 months,  No SI/HI, no psychosis   Patient Reported Schizophrenia/Schizoaffective Diagnosis in Past: No   Strengths: mom friend, arts and crafts,  Preferences: prefers to be with those she is comfortable with, doesn't prefer large crowds, prefers spending time with cats or friends  Abilities: knit, arts and crafts, good at [employer] ,  has a Environmental education officer,"   Type of Services Patient Feels are Needed: Bariatric   Initial Clinical Notes/Concerns: History of weight: Has been heavy since childhood and has increased in adulthood,  Weight loss attempts: weight loss medication, eating healthy, meets with a nutristionist,  Current Diet: patient is trying to be aware of her portion size, focus on high protein and healthy carbs, drinks soda occasionally, ocassionaly drinks juice,  Co-Morbid diagnoiss: PCOS, High cholesterol, pre-diabetic,   Previous Procedure: cyst removal from ovary: 2021, recovered well,  Family History of obesity: Many people in her family are "chubby."   Mental Health Symptoms Depression:  Change in energy/activity; Irritability; Difficulty Concentrating; Fatigue; Sleep (too much or little)   Duration of Depressive symptoms: Greater than two weeks   Mania:   None   Anxiety:   Tension; Irritability; Worrying   Psychosis:  None   Duration of Psychotic symptoms: No data recorded  Trauma:  Guilt/shame   Obsessions:  None   Compulsions:  None   Inattention:  None   Hyperactivity/Impulsivity:  N/A   Oppositional/Defiant Behaviors:  None   Emotional Irregularity:  None   Other Mood/Personality Symptoms:  None    Mental Status Exam Appearance and self-care  Stature:  Average   Weight:  Obese   Clothing:  Age-appropriate   Grooming:  Normal   Cosmetic use:  Age appropriate   Posture/gait:  Normal   Motor activity:  Repetitive   Sensorium  Attention:  Normal   Concentration:  Normal   Orientation:  X5   Recall/memory:  Normal   Affect and Mood  Affect:  Anxious   Mood:  Anxious   Relating  Eye contact:  Normal   Facial expression:  Anxious   Attitude toward examiner:  Cooperative   Thought and Language  Speech flow: Clear and Coherent; Normal   Thought content:  Appropriate to Mood and Circumstances   Preoccupation:  None   Hallucinations:  None   Organization:  No data recorded  [employer] of Knowledge:  Average   Intelligence:  Average   Abstraction:  Normal   Judgement:  Fair   Dance movement psychotherapist:  Adequate   Insight:  Fair   Decision Making:  Impulsive   Social Functioning  Social Maturity:  Isolates   Social Judgement:  Normal   Stress  Stressors:  Illness   Coping Ability:  Deficient supports   Skill Deficits:  None   Supports:  Family     Religion:  Leisure/Recreation:    Exercise/Diet:     CCA Employment/Education Employment/Work Situation:    Education:     CCA Family/Childhood History Family and Relationship History:    Childhood History:     Child/Adolescent Assessment:     CCA Substance Use Alcohol/Drug Use:                           ASAM's:  Six Dimensions of Multidimensional Assessment  Dimension 1:  Acute  Intoxication and/or Withdrawal Potential:      Dimension 2:  Biomedical Conditions and Complications:      Dimension 3:  Emotional, Behavioral, or Cognitive Conditions and Complications:     Dimension 4:  Readiness to Change:     Dimension 5:  Relapse, Continued use, or Continued Problem Potential:     Dimension 6:  Recovery/Living Environment:     ASAM Severity Score:    ASAM Recommended Level of Treatment:     Substance use Disorder (SUD)    Recommendations for Services/Supports/Treatments:    DSM5 Diagnoses: Patient Active Problem List   Diagnosis Date Noted   Anxiety and depression 07/23/2023   Folic acid  deficiency 07/23/2023   Prediabetes 08/29/2022   Insulin  resistance 06/26/2021   Hyperlipidemia 06/26/2021   Vitamin D  deficiency 06/26/2021   Suicidal ideation 04/08/2020   Major depressive disorder, single episode 04/07/2020   PTSD (post-traumatic stress disorder) 04/07/2020   Generalized anxiety disorder 04/07/2020   Severe episode of recurrent major depressive disorder, without psychotic features (HCC)    BMI 60.0-69.9, adult (HCC) 12/08/2017   PCOS (polycystic ovarian syndrome) 12/08/2017   Headache 02/05/2014    Patient Centered Plan: Patient is on the following Treatment Plan(s):  No treatment plan needed.    Behavioral Health Assessment  Patient Name  Chloe Pena  Date of Birth Jul 03, 1996   Age 28 y.o.   Date of Interview 02/03/2024    Gender female   Date of Report 05.20.2025   Purpose Bariatric/Weight-loss Surgery (pre-operative evaluation)        Assessment Instruments:  DSM-5-TR Self-Rated Level 1 Cross-Cutting Symptom Measure--Adult Severity Measure for Generalized Anxiety Disorder--Adult EAT-26  Chief Complain: Obesity  Client Background: Patient is a 28 y.o. Biracial female seeking weight loss surgery. Patient has a high school degree and working on her college degree. She is not currently employed.   Patient is single. The patient is  5 feet 5 inches tall and 420 lbs., placing her at a BMI of 69.9 classifying her in the obese range and at further risk of co-morbid diseases.  Weight History:  Patient has been heavy since childhood and her weight has increased in adulthood. Patient has tried weight loss medication, eating healthy, has met with a nutritionist, and is currently in [facility].   Eating Patterns:  Patient is trying to be aware of her portion size, focus on high protein and healthy carbs. She occasionally drinks soda.   Related Medical Issues:   Patient has been diagnosed with PCOS, high cholesterol, and is prediabetic. Patient has had a procedure where she had a cyst removed from her ovary in 2021 and recovered well.   Family History of Obesity:  Patient noted that many in her family are "chubby."   Tobacco Use: Patient denies tobacco use.   PATIENT BEHAVIORAL ASSESSMENT SCORES  Personal History of Mental Illness: Patient admits to past treatment for anxiety. She is in the process of starting therapy and understands  her approval for bariatric surgery is contingent on starting therapy to address past trauma.   Mental Status Examination: Patient was oriented x5 (person, place, situation, time, and object). She was appropriately groomed, and neatly dressed. Patient was alert, engaged, pleasant, and cooperative. Patient denies suicidal and homicidal ideations. Patient denies self-injury. 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 Patient rated herself a 3 indicating moderate, more than half the days on the "feeling more irritated, grouchy, or angry than usual" question. She has  had issues with her advisors and a professor about a class they wouldn't let her drop.   Severity Measure for Generalized Anxiety Disorder--Adult: Patient completed a 10-question scale. Total scores can range from 0 to 40. A raw score is calculated by summing the answer to each question, and an average total score is achieved by dividing the raw score by the number of items (e.g., 10). Patient had a total raw score of 12 out of 40 which was divided by the total number of questions answered (10) to get an average score of 1.2 which indicates mild clinically significant anxiety.   EAT-26: The EAT-26 is a twenty-six-question screening tool to identify symptoms of eating disorders and disordered eating. The patient scored 3 out of 26. Scores below a 20 are considered not meeting criteria for disordered eating. Patient denies inducing vomiting, or intentional meal skipping. Patient denies binge eating behaviors. Patient denies laxative abuse. Patient does not meet criteria for a DSM-V eating disorder.  Conclusion & Recommendations:  Chloe Pena  health history and current assessment indicate that she is suitable for bariatric surgery. Patient understands the procedure, the risks associated with it, and the importance of post-operative holistic care (Physical, Spiritual/Values, Relationships, and Mental/Emotional health) with access to resources for support as needed. The patient has made an informed decision to proceed with the procedure. The patient is motivated and expressed understanding of the post-surgical requirements. Patient's psychological assessment will be valid from today's date for 6 months (11.19.2025). Then, a follow-up appointment will be needed to re-evaluate the patient's psychological status.    I see no significant psychological factors that would hinder the success of bariatric surgery. I support Chloe Pena for Bariatric Surgery.     Braxton Calico,  LCSW   Referrals to Alternative Service(s): Referred to Alternative Service(s):   Place:   Date:   Time:    Referred to Alternative Service(s):   Place:   Date:   Time:    Referred to Alternative Service(s):   Place:   Date:   Time:    Referred to Alternative Service(s):   Place:   Date:   Time:      Collaboration of Care: Other provider involved in patient's care [facility]  Patient/Guardian was advised Release of Information must be obtained prior to any record release in order to collaborate their care with an outside provider. Patient/Guardian was advised if they have not already done so to contact the registration department to sign all necessary forms in order for us  to release information regarding their care.   Consent: Patient/Guardian gives verbal consent for treatment and assignment of benefits for services provided during this visit. Patient/Guardian expressed understanding and  agreed to proceed.   Braxton Calico, LCSW

## 2024-02-08 NOTE — Assessment & Plan Note (Signed)
 Elevated LDL previously in January.  On zetia  and statin. Needs refills of zetia  and statin today- prescriptions sent in.

## 2024-02-08 NOTE — Assessment & Plan Note (Signed)
 Doing reasonably well on wellbutrin .  Needs a refill today.  No side effects mentioned.  Patient is to undergo repeat psych evaluation for bariatric surgery.

## 2024-02-13 ENCOUNTER — Ambulatory Visit (INDEPENDENT_AMBULATORY_CARE_PROVIDER_SITE_OTHER): Admitting: Nurse Practitioner

## 2024-02-13 ENCOUNTER — Encounter: Payer: Self-pay | Admitting: Nurse Practitioner

## 2024-02-13 VITALS — BP 134/86 | HR 100

## 2024-02-13 DIAGNOSIS — Z23 Encounter for immunization: Secondary | ICD-10-CM | POA: Diagnosis not present

## 2024-02-13 DIAGNOSIS — Z7185 Encounter for immunization safety counseling: Secondary | ICD-10-CM

## 2024-02-13 DIAGNOSIS — A630 Anogenital (venereal) warts: Secondary | ICD-10-CM
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
## 2024-02-20 ENCOUNTER — Encounter: Attending: Family Medicine | Admitting: Dietician

## 2024-02-20 ENCOUNTER — Encounter: Payer: Self-pay | Admitting: Dietician

## 2024-02-20 VITALS — Ht 65.0 in | Wt >= 6400 oz

## 2024-02-20 DIAGNOSIS — E282 Polycystic ovarian syndrome: Secondary | ICD-10-CM

## 2024-02-20 DIAGNOSIS — Z6841 Body Mass Index (BMI) 40.0 and over, adult: Secondary | ICD-10-CM | POA: Diagnosis not present

## 2024-02-20 DIAGNOSIS — R7303 Prediabetes: Secondary | ICD-10-CM

## 2024-02-20 DIAGNOSIS — E7800 Pure hypercholesterolemia, unspecified: Secondary | ICD-10-CM

## 2024-02-20 DIAGNOSIS — Z713 Dietary counseling and surveillance: Secondary | ICD-10-CM | POA: Diagnosis not present

## 2024-02-20 NOTE — Patient Instructions (Signed)
 Work on gradually eliminating caffeine-containing beverages. Continue to include regular physical activity daily. Work to recognize symptoms of hunger vs appetite Keep emphasizing lean protein foods and low carb veggies, with small portions of starch or fruit.

## 2024-02-20 NOTE — Progress Notes (Signed)
 Nutrition Assessment for Bariatric Surgery   Appointment start time: 1120   end time: 1230  Planned Surgery: RnY gastric bypass  Anthropometrics: Weight: 432.5lbs Height: 5'5" BMI: 71.97    Clinical: Medical History: PCOS Medications and Supplements: buPROPion , ezetimibe , metFORMIN , nystatin , rosuvastatin , topiramate , vitamin D  supplement Relevant labs: 10/10/23 vitamin D  low at 28, total cholesterol 174, HDL 39, LDL 113, triglycerides 121; HbA1C 6.3%, insulin  elevated at 54.9 Notable symptoms: none Drug allergies: penicillins Food allergies: none  Lifestyle and Dietary History:  Dieting/ weight history:  tried weight loss medication with no success  No previous diets, other than low fodmap with mother to determine IBS triggers.  Reports following high protein diet, with goal of 1800kcal daily which she does not often reach. Tracks calories and reaches 1300kcal or more Has attended numerous health classes in the past.  Cooks most meals at home, reports gradual improvement in food choices over the years.   Disordered or emotional eating history: none; reports less eating when stressed/ tired.   Physical activity: PT exercises; activity with dog; caregiving for mother; house chores  Support from mother and friend of mother who has had weight loss surgery.   Dietary Recall:  Daily pattern: 3 meals and 2-3 snacks (depends on level of activity) Dining out: 0-1 meals per week. Breakfast: fairlife protein shake, apples, small portion cheese (due to taking metformin   Snack: same as pm Lunch: charcuterie style -- deli meat, cheese, whole wheat crackers, fruit Snack: granola bar with nuts, seeds; fruit -- apple/ mango/ seasonal Supper: beef/ chicken/ fish/ occ lamb + veggies ie broccoli/ carrots/ spinach/ kale/ zucchini/ squash + basmati rice mixed with wild rice and veg/ occasionally potatoes/ rarely pasta -- foods cooked in small amount olive oil. When out Chinese/ Greek/ occasional sub  sandwich Snack: none Beverages: water 4 bottles daily, coffee with milk/ creamer   Nutrition Intervention: Instructed patient on pre-op diet goals and importance of close adherence to bariatric diet after surgery to avoid side effects and complications.  Discussed stages of the bariatric diet after surgery as well as the importance of adequate protein and fluid intake.  Provided overview of 2-week pre-op diet.  Instructed on need for bariatric-specific multivitamins after surgery as well as calcium  supplements. Discussed/ reviewed importance of mental/ emotional support after surgery.  Nutrition Diagnosis: [place]-3.3 Overweight/ obesity related to history of excess calories and inadequate physical activity as evidenced by patient with current BMI of 71.97, following dietary guidelines for weight loss prior to bariatric surgery.  Teaching method(s) utilized: Risk manager provided: Preparing for Bariatric Surgery packet, including pre-op goals   Learning Readiness: Change in progress  Barriers to learning/ implementing lifestyle change: no barriers, patient voices motivation to make necessary lifestyle changes to promote health.   Demonstrated degree of understanding via: Teach Back  Summary: Patient has begun making diet and lifestyle changes in effort to lose weight and prepare for bariatric surgery. She has been participating in medically-supervised weight loss program Patient's goal for weight loss is improved health and risk reduction. She has solid support from family members and a friend. She plans to continue with mental health visits.  She agrees to work on maintaining healthy food choices, eliminating caffeine intake, and continuing with regular physical activity prior to surgery. She plans to increase mindfulness of hunger vs appetite.  She is motivated to follow the bariatric diet after surgery. From a nutrition standpoint, she is ready to proceed with  the bariatric surgery program.  Plan: Patient commits to returning for  pre-op class prior to surgery.  She will plan to return for post-op RD visits beginning 2 weeks after surgery.

## 2024-03-09 ENCOUNTER — Ambulatory Visit (INDEPENDENT_AMBULATORY_CARE_PROVIDER_SITE_OTHER): Admitting: Family Medicine

## 2024-03-09 ENCOUNTER — Encounter (INDEPENDENT_AMBULATORY_CARE_PROVIDER_SITE_OTHER): Payer: Self-pay | Admitting: Family Medicine

## 2024-03-09 VITALS — BP 137/74 | HR 126 | Temp 99.2°F | Ht 65.0 in | Wt >= 6400 oz

## 2024-03-09 DIAGNOSIS — E785 Hyperlipidemia, unspecified: Secondary | ICD-10-CM | POA: Diagnosis not present

## 2024-03-09 DIAGNOSIS — R7303 Prediabetes: Secondary | ICD-10-CM

## 2024-03-09 DIAGNOSIS — E559 Vitamin D deficiency, unspecified: Secondary | ICD-10-CM | POA: Diagnosis not present

## 2024-03-09 DIAGNOSIS — Z6841 Body Mass Index (BMI) 40.0 and over, adult: Secondary | ICD-10-CM | POA: Diagnosis not present

## 2024-03-09 DIAGNOSIS — E782 Mixed hyperlipidemia: Secondary | ICD-10-CM

## 2024-03-09 MED ORDER — VITAMIN D (ERGOCALCIFEROL) 1.25 MG (50000 UNIT) PO CAPS
50000.0000 [IU] | ORAL_CAPSULE | ORAL | 0 refills | Status: AC
Start: 2024-03-09 — End: ?

## 2024-03-09 MED ORDER — METFORMIN HCL 500 MG PO TABS
500.0000 mg | ORAL_TABLET | Freq: Two times a day (BID) | ORAL | 0 refills | Status: AC
Start: 2024-03-09 — End: ?

## 2024-03-09 NOTE — Assessment & Plan Note (Signed)
 Patient last A1c 6.3.  She is on metformin  daily- no GI side effects.  She needs a refill today.  Will increase Metformin  to twice a day and repeat labs in 1 month.

## 2024-03-09 NOTE — Progress Notes (Signed)
 SUBJECTIVE:  Chief Complaint: Obesity  Interim History: Patient got approved for bariatric surgery and she is surprised at that.  She saving up for the out of pocket cost. Patient didn't get to plant a garden this year.  She currently has some tomatoes growing and is refurbishing some furniture she got off the street.  She got kicked out of school again and is trying out new recipes.  She is eating more fruit- she is stressed.   Chloe Pena is here to discuss her progress with her obesity treatment plan. She is on the keeping a food journal and adhering to recommended goals of 1700-1850 calories and 125 protein and states she is following her eating plan approximately 80 % of the time. She states she is exercising physical therapy, refurbishing furniture, walking 1-2 hours 5-6 times per week.  OBJECTIVE: Visit Diagnoses: Problem List Items Addressed This Visit       Other   Morbid obesity (HCC)   Anthropometric Measurements Height: 5' 5 (1.651 m) Weight: (!) 426 lb (193.2 kg) BMI (Calculated): 70.89 Weight at Last Visit: 420 lb Weight Lost Since Last Visit: 0 Weight Gained Since Last Visit: 6 lb Starting Weight: 349 lb Total Weight Loss (lbs): 0 lb (0 kg) Peak Weight: 426 lb Body Composition  Body Fat %: 62.4 % Fat Mass (lbs): 266 lbs Muscle Mass (lbs): 152.2 lbs Visceral Fat Rating : 30 Other Clinical Data Fasting: no Labs: no Today's Visit #: 33 Starting Date: 06/15/21 Comments: [phone]       Relevant Medications   metFORMIN  (GLUCOPHAGE ) 500 MG tablet   Hyperlipidemia   Elevated cholesterols in the past.  Patient is fasting for labs today- will discuss results at next appointment.      Relevant Orders   Lipid Panel With LDL/HDL Ratio   Vitamin D  deficiency   Patient on prescription strength Vitamin D .  Needs a refill and a repeat lab done to assess response to treatment.       Relevant Medications   Vitamin D , Ergocalciferol , (DRISDOL ) 1.25 MG (50000 UNIT)  CAPS capsule   Other Relevant Orders   VITAMIN D  25 Hydroxy (Vit-D Deficiency, Fractures)   Prediabetes - Primary   Patient last A1c 6.3.  She is on metformin  daily- no GI side effects.  She needs a refill today.  Will increase Metformin  to twice a day and repeat labs in 1 month.      Relevant Medications   metFORMIN  (GLUCOPHAGE ) 500 MG tablet   Other Relevant Orders   Comprehensive metabolic panel with GFR   Hemoglobin A1c   Insulin , random   Other Visit Diagnoses       BMI 70 and over, adult (HCC)           No data recorded      03/09/2024    2:00 PM 02/20/2024   11:32 AM 02/13/2024   10:19 AM  Vitals with BMI  Height 5' 5 5' 5   Weight 426 lbs 432 lbs 8 oz   BMI 70.89 71.97   Systolic 137  134  Diastolic 74  86  Pulse 126  100       ASSESSMENT AND PLAN:  Diet: Kaisa is currently in the action stage of change. As such, her goal is to continue with weight loss efforts and has agreed to following a lower carbohydrate, vegetable and lean protein rich diet plan.   Exercise:  All adults should avoid inactivity. Some activity is better than none, and adults  who participate in any amount of physical activity, gain some health benefits.  Behavior Modification:  We discussed the following Behavioral Modification Strategies today: increasing lean protein intake, decreasing simple carbohydrates, increasing vegetables, meal planning and cooking strategies, and planning for success.   Return in about 5 weeks (around 04/13/2024).   She was informed of the importance of frequent follow up visits to maximize her success with intensive lifestyle modifications for her multiple health conditions.  Attestation Statements:   Reviewed by clinician on day of visit: allergies, medications, problem list, medical history, surgical history, family history, social history, and previous encounter notes.    Adelita Cho, MD

## 2024-03-18 ENCOUNTER — Ambulatory Visit: Admitting: Dietician

## 2024-03-21 NOTE — Assessment & Plan Note (Signed)
 Anthropometric Measurements Height: 5' 5 (1.651 m) Weight: (!) 426 lb (193.2 kg) BMI (Calculated): 70.89 Weight at Last Visit: 420 lb Weight Lost Since Last Visit: 0 Weight Gained Since Last Visit: 6 lb Starting Weight: 349 lb Total Weight Loss (lbs): 0 lb (0 kg) Peak Weight: 426 lb Body Composition  Body Fat %: 62.4 % Fat Mass (lbs): 266 lbs Muscle Mass (lbs): 152.2 lbs Visceral Fat Rating : 30 Other Clinical Data Fasting: no Labs: no Today's Visit #: 33 Starting Date: 06/15/21 Comments: [phone]

## 2024-03-21 NOTE — Assessment & Plan Note (Signed)
 Patient on prescription strength Vitamin D .  Needs a refill and a repeat lab done to assess response to treatment.

## 2024-03-21 NOTE — Assessment & Plan Note (Signed)
 Elevated cholesterols in the past.  Patient is fasting for labs today- will discuss results at next appointment.

## 2024-04-14 ENCOUNTER — Ambulatory Visit (INDEPENDENT_AMBULATORY_CARE_PROVIDER_SITE_OTHER): Admitting: Family Medicine

## 2024-04-14 ENCOUNTER — Encounter (INDEPENDENT_AMBULATORY_CARE_PROVIDER_SITE_OTHER): Payer: Self-pay | Admitting: Family Medicine

## 2024-04-14 VITALS — BP 126/84 | HR 89 | Temp 98.8°F | Ht 65.0 in | Wt >= 6400 oz

## 2024-04-14 DIAGNOSIS — Z6841 Body Mass Index (BMI) 40.0 and over, adult: Secondary | ICD-10-CM

## 2024-04-14 DIAGNOSIS — E782 Mixed hyperlipidemia: Secondary | ICD-10-CM | POA: Diagnosis not present

## 2024-04-14 DIAGNOSIS — F419 Anxiety disorder, unspecified: Secondary | ICD-10-CM

## 2024-04-14 DIAGNOSIS — F331 Major depressive disorder, recurrent, moderate: Secondary | ICD-10-CM | POA: Diagnosis not present

## 2024-04-14 MED ORDER — ROSUVASTATIN CALCIUM 20 MG PO TABS
20.0000 mg | ORAL_TABLET | Freq: Every day | ORAL | 0 refills | Status: AC
Start: 2024-04-14 — End: ?

## 2024-04-14 MED ORDER — EZETIMIBE 10 MG PO TABS
10.0000 mg | ORAL_TABLET | Freq: Every day | ORAL | 0 refills | Status: AC
Start: 2024-04-14 — End: ?

## 2024-04-14 MED ORDER — SERTRALINE HCL 100 MG PO TABS: ORAL_TABLET | ORAL | 1 refills | Status: AC

## 2024-04-14 MED ORDER — BUPROPION HCL ER (XL) 150 MG PO TB24
150.0000 mg | ORAL_TABLET | Freq: Every day | ORAL | 1 refills | Status: AC
Start: 2024-04-14 — End: ?

## 2024-04-14 NOTE — Progress Notes (Unsigned)
 SUBJECTIVE:  Chief Complaint: Obesity  Interim History: Patient mentions that she has had a difficult few weeks- arguing with her mother more.  Her mother is not listening to patient when patient is trying to take care of her.  She is waiting on raising 1k for bariatric surgery.  She mentions that there are 4 providers helping her find a trauma therapist.  She is interested in possibly seeing a provider at the office her mom goes to.  She has been trying to do heavy protein meals and homemade snacks for intake.  She learned how to make a new lamb dish. She has been drinking more milk and eating more fruit.  Chloe Pena is here to discuss her progress with her obesity treatment plan. She is on the following a lower carbohydrate, vegetable and lean protein rich diet plan and states she is following her eating plan approximately 80 % of the time. She states she is cleaning and housework.  OBJECTIVE: Visit Diagnoses: Problem List Items Addressed This Visit       Other   Morbid obesity (HCC)   Hyperlipidemia - Primary   Relevant Medications   ezetimibe  (ZETIA ) 10 MG tablet   rosuvastatin  (CRESTOR ) 20 MG tablet   Anxiety and depression   Relevant Medications   buPROPion  (WELLBUTRIN  XL) 150 MG 24 hr tablet   Other Visit Diagnoses       Moderate episode of recurrent major depressive disorder (HCC)       Relevant Medications   buPROPion  (WELLBUTRIN  XL) 150 MG 24 hr tablet     BMI 70 and over, adult (HCC)           Vitals Temp: 98.8 F (37.1 C) BP: 126/84 Pulse Rate: 89 SpO2: 96 %   Anthropometric Measurements Height: 5' 5 (1.651 m) Weight: (!) 431 lb (195.5 kg) BMI (Calculated): 71.72 Weight at Last Visit: 426 lb Weight Lost Since Last Visit: 0 Weight Gained Since Last Visit: 5 Starting Weight: 349 lb Total Weight Loss (lbs): 0 lb (0 kg)   Body Composition  Body Fat %: 63.5 % Fat Mass (lbs): 273.8 lbs Muscle Mass (lbs): 149.4 lbs Visceral Fat Rating : 30   Other  Clinical Data Today's Visit #: 34 Starting Date: 06/15/21 Comments: Low Carb     ASSESSMENT AND PLAN: Assessment & Plan Mixed hyperlipidemia Patient is on medication for cholesterol management.  She is tolerating Zetia  and statin with no side effects mentioned.  Will continue current medications at same doses and will need repeat labs to reevaluate management in 4 months.  Patient to continue to try to control total saturated fat intake to less than 20% of her daily intake. Anxiety and depression Patient very emotional today.  She needs a refill of her Zoloft  and Wellbutrin  and will stay at current doses.  She is looking for a trauma counselor as our many members of her medical treatment team.  She mentions she feels frustrated when her mother does not listen to her concerning her health.  She feels a reset today being at home alone will help her.  She denies any suicidal or homicidal ideation.  Patient to reach out to therapy center that her mother goes to to see if they accept her insurance.  Will follow-up on this at next appointment. Moderate episode of recurrent major depressive disorder (HCC) As above patient is currently on Zoloft  and Wellbutrin  and tolerating both fine.  Needs refills today.  Looking for counselor at this time. Morbid obesity (HCC)  BMI 70 and over, adult [facility])    Diet: Chloe Pena is currently in the action stage of change. As such, her goal is to continue with weight loss efforts and has agreed to following a lower carbohydrate, vegetable and lean protein rich diet plan.   Exercise:  For substantial health benefits, adults should do at least 150 minutes (2 hours and 30 minutes) a week of moderate-intensity, or 75 minutes (1 hour and 15 minutes) a week of vigorous-intensity aerobic physical activity, or an equivalent combination of moderate- and vigorous-intensity aerobic activity. Aerobic activity should be performed in episodes of at least 10 minutes, and preferably,  it should be spread throughout the week.  Behavior Modification:  We discussed the following Behavioral Modification Strategies today: increasing lean protein intake, decreasing simple carbohydrates, increasing vegetables, meal planning and cooking strategies, planning for success, and keep a strict food journal.   Return in about 6 weeks (around 05/26/2024).   She was informed of the importance of frequent follow up visits to maximize her success with intensive lifestyle modifications for her multiple health conditions.  Attestation Statements:   Reviewed by clinician on day of visit: allergies, medications, problem list, medical history, surgical history, family history, social history, and previous encounter notes.   Adelita Cho, MD

## 2024-04-15 ENCOUNTER — Ambulatory Visit

## 2024-04-16 NOTE — Assessment & Plan Note (Signed)
 Patient very emotional today.  She needs a refill of her Zoloft  and Wellbutrin  and will stay at current doses.  She is looking for a trauma counselor as our many members of her medical treatment team.  She mentions she feels frustrated when her mother does not listen to her concerning her health.  She feels a reset today being at home alone will help her.  She denies any suicidal or homicidal ideation.  Patient to reach out to therapy center that her mother goes to to see if they accept her insurance.  Will follow-up on this at next appointment.

## 2024-04-16 NOTE — Assessment & Plan Note (Signed)
 Patient is on medication for cholesterol management.  She is tolerating Zetia  and statin with no side effects mentioned.  Will continue current medications at same doses and will need repeat labs to reevaluate management in 4 months.  Patient to continue to try to control total saturated fat intake to less than 20% of her daily intake.

## 2024-05-06 ENCOUNTER — Ambulatory Visit: Attending: Family Medicine

## 2024-05-26 ENCOUNTER — Ambulatory Visit (INDEPENDENT_AMBULATORY_CARE_PROVIDER_SITE_OTHER): Admitting: Family Medicine

## 2024-05-27 ENCOUNTER — Ambulatory Visit: Attending: Family Medicine | Admitting: Family Medicine

## 2024-05-27 VITALS — BP 137/80 | HR 86 | Ht 65.0 in | Wt >= 6400 oz

## 2024-05-27 DIAGNOSIS — G43709 Chronic migraine without aura, not intractable, without status migrainosus: Secondary | ICD-10-CM | POA: Diagnosis not present

## 2024-05-27 DIAGNOSIS — Z91120 Patient's intentional underdosing of medication regimen due to financial hardship: Secondary | ICD-10-CM

## 2024-05-27 DIAGNOSIS — E785 Hyperlipidemia, unspecified: Secondary | ICD-10-CM | POA: Diagnosis not present

## 2024-05-27 DIAGNOSIS — Z23 Encounter for immunization: Secondary | ICD-10-CM | POA: Diagnosis not present

## 2024-05-27 DIAGNOSIS — F419 Anxiety disorder, unspecified: Secondary | ICD-10-CM | POA: Diagnosis not present

## 2024-05-27 DIAGNOSIS — E559 Vitamin D deficiency, unspecified: Secondary | ICD-10-CM | POA: Diagnosis not present

## 2024-05-27 DIAGNOSIS — F331 Major depressive disorder, recurrent, moderate: Secondary | ICD-10-CM

## 2024-05-27 DIAGNOSIS — R7303 Prediabetes: Secondary | ICD-10-CM

## 2024-05-27 DIAGNOSIS — Z6841 Body Mass Index (BMI) 40.0 and over, adult: Secondary | ICD-10-CM | POA: Diagnosis not present

## 2024-05-27 DIAGNOSIS — F32A Depression, unspecified: Secondary | ICD-10-CM

## 2024-05-27 DIAGNOSIS — F4321 Adjustment disorder with depressed mood: Secondary | ICD-10-CM | POA: Diagnosis not present

## 2024-05-27 MED ORDER — RIZATRIPTAN BENZOATE 10 MG PO TABS: 10.0000 mg | ORAL_TABLET | ORAL | 3 refills | Status: AC | PRN

## 2024-05-27 NOTE — Patient Instructions (Signed)
 VISIT SUMMARY:  Today, you came in for a routine follow-up appointment. We discussed your recent lab results, medication adherence, and ongoing health concerns. Your A1c level has improved, but you have been unable to take some of your medications due to financial constraints. We also talked about your mental health and the impact of recent events on your well-being. Additionally, we reviewed your upcoming bariatric surgery and migraine management.  YOUR PLAN:  -MORBID OBESITY: Morbid obesity means having a body mass index (BMI) of 40 or higher, which can lead to serious health problems. You are scheduled for bariatric surgery in November, and we will coordinate with case manager Slater to explore financial assistance options. You are also referred to the [facility] for additional support.  -PREDIABETES: Prediabetes means your blood sugar levels are higher than normal but not high enough to be classified as diabetes. Your A1c level has decreased from 6.3% to 6.1%, indicating improved blood sugar control. Continue with your current dietary plan.  -PURE HYPERCHOLESTEROLEMIA: Pure hypercholesterolemia means having high levels of cholesterol in the blood, which can increase the risk of heart disease. You have been off Zetia  due to financial constraints but plan to resume it after your next paycheck. We will recheck your cholesterol levels at your next visit.  -DEPRESSION AND ANXIETY: Depression and anxiety are mental health conditions that can affect your mood and overall well-being. You have been off sertraline  and Wellbutrin  due to financial constraints and are experiencing significant grief from the recent loss of your pets. You should resume sertraline  and Wellbutrin  after your next paycheck. We will explore grief counseling options and continue searching for a therapist specializing in trauma counseling.  -CHRONIC MIGRAINE WITHOUT AURA: Chronic migraine without aura means having frequent  migraine headaches without sensory disturbances. Your migraines are generally controlled with topiramate , but marijuana smoke can trigger severe episodes. We have prescribed Miochol for acute migraine management.  INSTRUCTIONS:  Please follow up with case manager Slater to explore financial assistance options for your bariatric surgery. Resume taking Zetia , sertraline , and Wellbutrin  after your next paycheck. Recheck your cholesterol levels at your next visit. Continue to search for a therapist specializing in trauma counseling and explore grief counseling options for your recent pet loss.

## 2024-05-27 NOTE — Progress Notes (Signed)
 "  Subjective:  Patient ID: Chloe Pena, female    DOB: 1995/10/10  Age: 28 y.o. MRN: [id number]  CC: Medical Management of Chronic Issues     Discussed the use of AI scribe software for clinical note transcription with the patient, who gave verbal consent to proceed.  History of Present Illness Chloe Pena is a 28 year old female with a history of anxiety and depression, migraines, acne, PCOS, chronic back pain, prediabetes, metabolic syndrome who presents for a routine follow-up.  Her A1c level has decreased to 6.1 from 6.3, with no changes in her dietary habits. She has not taken Zetia  or rosuvastatin  for the past month due to financial constraints and plans to resume them after her next paycheck. Previous labs showed slightly elevated cholesterol and liver enzymes.  She has been off sertraline  and Wellbutrin  for the past month due to financial issues, experiencing significant mental distress exacerbated by the recent death of two kittens. She is seeking a therapist to address ongoing grief and trauma.  Her migraines are manageable with topiramate , though exposure to marijuana smoke can trigger severe episodes.  She would like a medication for breakthrough headaches.  She is following a weight management diet and is awaiting bariatric surgery in November, facing financial challenges with the copay.    Past Medical History:  Diagnosis Date   Anxiety    no meds   Asthma    as a child, no inhaler, no problems as adult   Depression    Fatty liver    Migraine    otc med prn   PCOS (polycystic ovarian syndrome)     Past Surgical History:  Procedure Laterality Date   LAPAROSCOPIC OVARIAN CYSTECTOMY N/A 02/11/2018   Procedure: LAPAROSCOPIC OVARIAN CYSTECTOMY;  Surgeon: Nicholaus Burnard HERO, MD;  Location: [facility];  Service: Gynecology;  Laterality: N/A;   WISDOM TOOTH EXTRACTION      Family History  Problem Relation Age of Onset   Migraines Mother    GER disease  Mother    Heart disease Mother    Heart failure Mother    Obesity Mother    Diabetes Father    Obesity Father    Thyroid  disease Brother    Breast cancer Maternal Grandmother    Hypertension Maternal Grandmother    Diabetes Maternal Grandmother     Social History   Socioeconomic History   Marital status: Single    Spouse name: Not on file   Number of children: Not on file   Years of education: Not on file   Highest education level: Not on file  Occupational History   Occupation: Student  Tobacco Use   Smoking status: Never   Smokeless tobacco: Never  Vaping Use   Vaping status: Never Used  Substance and Sexual Activity   Alcohol use: No   Drug use: No   Sexual activity: Never    Birth control/protection: Implant    Comment: Nexplanon  replaced 05/18/2021, undecided  Other Topics Concern   Not on file  Social History Narrative   Not on file   Social Drivers of Health   Financial Resource Strain: Not on file  Food Insecurity: Food Insecurity Present (04/18/2023)   Hunger Vital Sign    Worried About Running Out of Food in the Last Year: Often true    Ran Out of Food in the Last Year: Never true  Transportation Needs: Not on file  Physical Activity: Not on file  Stress: Not on file  Social Connections: Unknown (07/26/2023)   Received from [facility]   Social Network    Social Network: Not on file    No Known Allergies  Outpatient Medications Prior to Visit  Medication Sig Dispense Refill   buPROPion  (WELLBUTRIN  XL) 150 MG 24 hr tablet Take 1 tablet (150 mg total) by mouth daily. 90 tablet 1   ezetimibe  (ZETIA ) 10 MG tablet Take 1 tablet (10 mg total) by mouth daily. 90 tablet 0   medroxyPROGESTERone  (PROVERA ) 10 MG tablet Take 1 tablet (10 mg total) by mouth daily. 10 tablet 0   metFORMIN  (GLUCOPHAGE ) 500 MG tablet Take 1 tablet (500 mg total) by mouth 2 (two) times daily with a meal. 180 tablet 0   nystatin  (MYCOSTATIN ) 100000 UNIT/ML suspension Take 5 mLs  (500,000 Units total) by mouth 4 (four) times daily. 60 mL 0   Prenatal Vit-Fe Fumarate-FA (PRENATAL VITAMINS) 27-0.8 MG TABS Take 1 tablet by mouth daily at 6 (six) AM.     rosuvastatin  (CRESTOR ) 20 MG tablet Take 1 tablet (20 mg total) by mouth daily. 90 tablet 0   sertraline  (ZOLOFT ) 100 MG tablet TAKE 1 TABLET (100 MG TOTAL) BY MOUTH DAILY (AM) 90 tablet 1   topiramate  (TOPAMAX ) 50 MG tablet TAKE 1 TABLET (25 MG TOTAL) BY MOUTH DAILY. (AM) 30 tablet 0   triamcinolone  cream (KENALOG ) 0.1 % Apply 1 application  topically 2 (two) times daily. 45 g 1   Vitamin D , Ergocalciferol , (DRISDOL ) 1.25 MG (50000 UNIT) CAPS capsule Take 1 capsule (50,000 Units total) by mouth once a week. 12 capsule 0   No facility-administered medications prior to visit.     ROS Review of Systems  Constitutional:  Negative for activity change and appetite change.  HENT:  Negative for sinus pressure and sore throat.   Respiratory:  Negative for chest tightness, shortness of breath and wheezing.   Cardiovascular:  Negative for chest pain and palpitations.  Gastrointestinal:  Negative for abdominal distention, abdominal pain and constipation.  Genitourinary: Negative.   Musculoskeletal: Negative.   Psychiatric/Behavioral:  Positive for dysphoric mood. Negative for behavioral problems.     Objective:  BP 137/80   Pulse 86   Ht 5' 5 (1.651 m)   Wt (!) 425 lb 9.6 oz (193.1 kg)   SpO2 97%   BMI 70.82 kg/m      05/27/2024    8:40 AM 04/14/2024    8:00 AM 03/09/2024    2:00 PM  BP/Weight  Systolic BP 137 126 137  Diastolic BP 80 84 74  Wt. (Lbs) 425.6 431 426  BMI 70.82 kg/m2 71.72 kg/m2 70.89 kg/m2      Physical Exam Constitutional:      Appearance: She is well-developed.  Cardiovascular:     Rate and Rhythm: Normal rate.     Heart sounds: Normal heart sounds. No murmur heard. Pulmonary:     Effort: Pulmonary effort is normal.     Breath sounds: Normal breath sounds. No wheezing or rales.  Chest:      Chest wall: No tenderness.  Abdominal:     General: Bowel sounds are normal. There is no distension.     Palpations: Abdomen is soft. There is no mass.     Tenderness: There is no abdominal tenderness.  Musculoskeletal:        General: Normal range of motion.     Right lower leg: No edema.     Left lower leg: No edema.  Neurological:  Mental Status: She is alert and oriented to person, place, and time.  Psychiatric:     Comments: Dysphoric mood        Latest Ref Rng & Units 05/27/2024    9:27 AM 10/10/2023   12:22 PM 03/13/2023   12:56 PM  CMP  Glucose 70 - 99 mg/dL 897  86  92   BUN 6 - 20 mg/dL 15  13  12    Creatinine 0.57 - 1.00 mg/dL 9.13  9.15  9.24   Sodium 134 - 144 mmol/L 139  139  147   Potassium 3.5 - 5.2 mmol/L 4.6  4.6  5.0   Chloride 96 - 106 mmol/L 99  101  107   CO2 20 - 29 mmol/L 22  20  22    Calcium  8.7 - 10.2 mg/dL 9.7  9.1  9.2   Total Protein 6.0 - 8.5 g/dL 7.2  7.3  7.2   Total Bilirubin 0.0 - 1.2 mg/dL 0.9  1.1  0.9   Alkaline Phos 44 - 121 IU/L 122  131  133   AST 0 - 40 IU/L 67  61  47   ALT 0 - 32 IU/L 121  98  75     Lipid Panel     Component Value Date/Time   CHOL 273 (H) 05/27/2024 0927   TRIG 174 (H) 05/27/2024 0927   HDL 35 (L) 05/27/2024 0927   CHOLHDL 7.3 04/06/2020 0235   VLDL 34 04/06/2020 0235   LDLCALC 205 (H) 05/27/2024 0927    CBC    Component Value Date/Time   WBC 10.8 02/14/2022 1453   WBC 12.0 (H) 02/03/2018 0830   RBC 4.50 02/14/2022 1453   RBC 3.83 (L) 02/03/2018 0830   HGB 13.6 02/14/2022 1453   HCT 39.8 02/14/2022 1453   PLT 326 02/14/2022 1453   MCV 88 02/14/2022 1453   MCH 30.2 02/14/2022 1453   MCH 30.8 02/03/2018 0830   MCHC 34.2 02/14/2022 1453   MCHC 32.1 02/03/2018 0830   RDW 12.3 02/14/2022 1453   LYMPHSABS 2.9 02/14/2022 1453   MONOABS 0.7 03/09/2009 0803   EOSABS 0.2 02/14/2022 1453   BASOSABS 0.2 02/14/2022 1453    Lab Results  Component Value Date   HGBA1C 6.3 (H) 10/10/2023    Lab  Results  Component Value Date   HGBA1C 6.3 (H) 10/10/2023   HGBA1C 6.2 (H) 03/13/2023   HGBA1C 5.8 (H) 05/28/2022       Assessment & Plan Morbid obesity Scheduled for bariatric surgery in November, pending financial arrangements.  Prediabetes A1c decreased from 6.3% to 6.1%, indicating improved glycemic control. - Continue current dietary plan.  Pure hypercholesterolemia Off Zetia  due to financial constraints, will resume after next paycheck. - Recheck cholesterol levels at next visit. - Resume Zetia  after receiving paycheck.  Depression and anxiety Off sertraline  and Wellbutrin  due to financial constraints. Experiencing significant grief and trauma from recent pet loss. - Resume sertraline  and Wellbutrin  after receiving paycheck. - Explore grief counseling options for pet loss. - Continue to search for a therapist specializing in trauma counseling. - Refer to [facility] for additional support.   Chronic migraine without aura Migraines generally controlled with topiramate . Marijuana smoke triggers severe migraines. - Prescribe Maxalt  for acute migraine management.      Meds ordered this encounter  Medications   rizatriptan  (MAXALT ) 10 MG tablet    Sig: Take 1 tablet (10 mg total) by mouth as needed for migraine. May repeat  in 2 hours if needed. Max daily dose - 20mg     Dispense:  10 tablet    Refill:  3    Return in about 6 months (around 11/24/2024) for Chronic medical conditions.       Corrina Sabin, MD, FAAFP. [facility] [place], [place] [phone]   05/29/2024, 1:12 PM "

## 2024-05-28 ENCOUNTER — Ambulatory Visit: Payer: Self-pay | Admitting: Family Medicine

## 2024-05-28 LAB — CMP14+EGFR
ALT: 121 IU/L — ABNORMAL HIGH (ref 0–32)
AST: 67 IU/L — ABNORMAL HIGH (ref 0–40)
Albumin: 4.6 g/dL (ref 4.0–5.0)
Alkaline Phosphatase: 122 IU/L — ABNORMAL HIGH (ref 44–121)
BUN/Creatinine Ratio: 17 (ref 9–23)
BUN: 15 mg/dL (ref 6–20)
Bilirubin Total: 0.9 mg/dL (ref 0.0–1.2)
CO2: 22 mmol/L (ref 20–29)
Calcium: 9.7 mg/dL (ref 8.7–10.2)
Chloride: 99 mmol/L (ref 96–106)
Creatinine, Ser: 0.86 mg/dL (ref 0.57–1.00)
Globulin, Total: 2.6 g/dL (ref 1.5–4.5)
Glucose: 102 mg/dL — ABNORMAL HIGH (ref 70–99)
Potassium: 4.6 mmol/L (ref 3.5–5.2)
Sodium: 139 mmol/L (ref 134–144)
Total Protein: 7.2 g/dL (ref 6.0–8.5)
eGFR: 93 mL/min/1.73 (ref >59)

## 2024-05-28 LAB — LP+NON-HDL CHOLESTEROL
Cholesterol, Total: 273 mg/dL — ABNORMAL HIGH (ref 100–199)
HDL: 35 mg/dL — ABNORMAL LOW (ref >39)
LDL Chol Calc (NIH): 205 mg/dL — ABNORMAL HIGH (ref 0–99)
Total Non-HDL-Chol (LDL+VLDL): 238 mg/dL — ABNORMAL HIGH (ref 0–129)
Triglycerides: 174 mg/dL — ABNORMAL HIGH (ref 0–149)
VLDL Cholesterol Cal: 33 mg/dL (ref 5–40)

## 2024-05-28 LAB — VITAMIN D 25 HYDROXY (VIT D DEFICIENCY, FRACTURES): Vit D, 25-Hydroxy: 23.5 ng/mL — ABNORMAL LOW (ref 30.0–100.0)

## 2024-05-29 ENCOUNTER — Encounter: Payer: Self-pay | Admitting: Licensed Clinical Social Worker

## 2024-05-29 ENCOUNTER — Encounter: Payer: Self-pay | Admitting: Family Medicine

## 2024-05-29 NOTE — Patient Instructions (Signed)
 Chloe Pena - I am sorry I was unable to reach you today. I work with Newlin, Enobong, MD and am calling you in regards to a referral to assist in support of your healthcare needs. Please contact me at [phone] or [phone] at your earliest convenience. I look forward to speaking with you soon.   Thank you,  Rolin Kerns, LCSW [place]  [facility], [facility] Clinical Social Worker Direct Dial: [phone]  Fax: [phone] Website: [email or web address] 5:36 PM

## 2024-06-01 ENCOUNTER — Telehealth: Payer: Self-pay | Admitting: Licensed Clinical Social Worker

## 2024-06-01 NOTE — Patient Outreach (Signed)
 LCSW introduced self and explained role in Complex Care Management. Pt is interested in participating and agreed to schedule initial for 09/17 at 3 PM

## 2024-06-03 ENCOUNTER — Other Ambulatory Visit: Payer: Self-pay | Admitting: Licensed Clinical Social Worker

## 2024-06-03 DIAGNOSIS — F411 Generalized anxiety disorder: Secondary | ICD-10-CM

## 2024-06-03 DIAGNOSIS — R7303 Prediabetes: Secondary | ICD-10-CM

## 2024-06-03 DIAGNOSIS — F332 Major depressive disorder, recurrent severe without psychotic features: Secondary | ICD-10-CM

## 2024-06-03 DIAGNOSIS — E88819 Insulin resistance, unspecified: Secondary | ICD-10-CM

## 2024-06-03 DIAGNOSIS — F4310 Post-traumatic stress disorder, unspecified: Secondary | ICD-10-CM

## 2024-06-03 NOTE — Patient Instructions (Signed)
 Visit Information  Thank you for taking time to visit with me today. Please don't hesitate to contact me if I can be of assistance to you before our next scheduled appointment.  Our next appointment is by telephone on 10/14 at 3 PM Please call the care guide team at [phone] if you need to cancel or reschedule your appointment.   Following is a copy of your care plan:   Goals Addressed             This Visit's Progress    LCSW [facility] Social Work Care Plan   On track    Problems:   Disease Management support and education needs related to MDD, PTSD, Anxiety  CSW Clinical Goal(s):   Over the next 90 days the Patient will attend all scheduled medical appointments as evidenced by patient report and care team review of appointment completion in electronic MEDICAL RECORD NUMBER  demonstrate a reduction in symptoms related to MDD, PTSD, Anxiety .  Interventions:  Mental Health:  Evaluation of current treatment plan related to MDD, PTSD, Anxiety Active listening / Reflection utilized Financial risk analyst / information provided Emotional Support Provided Mindfulness or Relaxation training provided Participation in counseling encourage : Pt reports meeting with 8 counselors and 3 psychiatrists who states she needed professionals who specialize in complex trauma and anxiety. Pt also reports finding providers who are in-network is a barrier. LCSW discussed various options to strengthen support system Participation in support group encouraged : LCSW discussed various options to strengthen support system Provided general psycho-education for mental health needs Reviewed mental health medications and discussed importance of compliance: Pt reports compliance   Patient Goals/Self-Care Activities:  Connect with provider for ongoing mental health treatment.   Continue taking your medication as prescribed.   Increase coping skills  Plan:   Telephone follow up appointment with care  management team member scheduled for:  4 weeks        Please call the Suicide and Crisis Lifeline: 988 go to [facility] [address] [place] [phone]) call 911 if you are experiencing a Mental Health or Behavioral Health Crisis or need someone to talk to.  Patient verbalizes understanding of instructions and care plan provided today and agrees to view in MyChart. Active MyChart status and patient understanding of how to access instructions and care plan via MyChart confirmed with patient.     Rolin Ezzard HUG[facility]  [facility], [facility] Clinical Social Worker Direct Dial: [phone]  Fax: [phone] Website: [email or web address] 4:10 PM

## 2024-06-03 NOTE — Patient Outreach (Signed)
 Complex Care Management   Visit Note  06/03/2024  Name:  Chloe Pena MRN: [id number] DOB: 1995/12/06  Situation: Referral received for Complex Care Management related to Mental/Behavioral Health diagnosis MDD, PTSD, GAD I obtained verbal consent from Patient.  Visit completed with Patient  on the phone  Background:   Past Medical History:  Diagnosis Date   Anxiety    no meds   Asthma    as a child, no inhaler, no problems as adult   Depression    Fatty liver    Migraine    otc med prn   PCOS (polycystic ovarian syndrome)     Assessment: Patient Reported Symptoms:  Cognitive Cognitive Status: Alert and oriented to person, place, and time, Normal speech and language skills Cognitive/Intellectual Conditions Management [RPT]: None reported or documented in medical history or problem list   Health Maintenance Behaviors: Annual physical exam  Neurological Neurological Review of Symptoms: Headaches Neurological Management Strategies: Medication therapy, Coping strategies  HEENT HEENT Symptoms Reported: No symptoms reported      Cardiovascular Cardiovascular Symptoms Reported: No symptoms reported    Respiratory Respiratory Symptoms Reported: No symptoms reported    Endocrine Endocrine Symptoms Reported: No symptoms reported Is patient diabetic?: No    Gastrointestinal Gastrointestinal Symptoms Reported: No symptoms reported      Genitourinary Genitourinary Symptoms Reported: No symptoms reported    Integumentary Integumentary Symptoms Reported: No symptoms reported    Musculoskeletal Musculoskelatal Symptoms Reviewed: Back pain Additional Musculoskeletal Details: Due to weight gain Musculoskeletal Management Strategies: Coping strategies Falls in the past year?: No Number of falls in past year: 1 or less Was there an injury with Fall?: No Fall Risk Category Calculator: 0 Patient Fall Risk Level: Low Fall Risk Patient at Risk for Falls Due to: No Fall Risks   Psychosocial       Quality of Family Relationships: helpful, stressful Do you feel physically threatened by others?: No    06/03/2024    PHQ2-9 Depression Screening   Little interest or pleasure in doing things    Feeling down, depressed, or hopeless    PHQ-2 - Total Score    Trouble falling or staying asleep, or sleeping too much    Feeling tired or having little energy    Poor appetite or overeating     Feeling bad about yourself - or that you are a failure or have let yourself or your family down    Trouble concentrating on things, such as reading the newspaper or watching television    Moving or speaking so slowly that other people could have noticed.  Or the opposite - being so fidgety or restless that you have been moving around a lot more than usual    Thoughts that you would be better off dead, or hurting yourself in some way    PHQ2-9 Total Score    If you checked off any problems, how difficult have these problems made it for you to do your work, take care of things at home, or get along with other people    Depression Interventions/Treatment      There were no vitals filed for this visit.  Medications Reviewed Today     Reviewed by Masae Lukacs D, LCSW (Social Worker) on 06/03/24 at 1516  Med List Status: <None>   Medication Order Taking? Sig Documenting Provider Last Dose Status Informant  buPROPion  (WELLBUTRIN  XL) 150 MG 24 hr tablet [id number] Yes Take 1 tablet (150 mg total) by mouth daily. [place],  Adelita PENNER, MD  Active   ezetimibe  (ZETIA ) 10 MG tablet [id number] Yes Take 1 tablet (10 mg total) by mouth daily. [place] Adelita PENNER, MD  Active   medroxyPROGESTERone  (PROVERA ) 10 MG tablet [id number] Yes Take 1 tablet (10 mg total) by mouth daily. Prentiss Annabella LABOR, NP  Active   metFORMIN  (GLUCOPHAGE ) 500 MG tablet [id number] Yes Take 1 tablet (500 mg total) by mouth 2 (two) times daily with a meal. [place] Adelita PENNER, MD  Active   nystatin  (MYCOSTATIN ) 100000  UNIT/ML suspension [id number] Yes Take 5 mLs (500,000 Units total) by mouth 4 (four) times daily. Newlin, Enobong, MD  Active   Prenatal Vit-Fe Fumarate-FA (PRENATAL VITAMINS) 27-0.8 MG TABS [id number] Yes Take 1 tablet by mouth daily at 6 (six) AM. [place] Adelita PENNER, MD  Active   rizatriptan  (MAXALT ) 10 MG tablet [id number] Yes Take 1 tablet (10 mg total) by mouth as needed for migraine. May repeat in 2 hours if needed. Max daily dose - 20mg  Newlin, Enobong, MD  Active   rosuvastatin  (CRESTOR ) 20 MG tablet [id number] Yes Take 1 tablet (20 mg total) by mouth daily. [place] Adelita PENNER, MD  Active   sertraline  (ZOLOFT ) 100 MG tablet [id number] Yes TAKE 1 TABLET (100 MG TOTAL) BY MOUTH DAILY (AM) [place] Adelita PENNER, MD  Active   topiramate  (TOPAMAX ) 50 MG tablet [id number] Yes TAKE 1 TABLET (25 MG TOTAL) BY MOUTH DAILY. (AM) Delbert Clam, MD  Active   triamcinolone  cream (KENALOG ) 0.1 % [id number] Yes Apply 1 application  topically 2 (two) times daily. Newlin, Enobong, MD  Active   Vitamin D , Ergocalciferol , (DRISDOL ) 1.25 MG (50000 UNIT) CAPS capsule [id number] Yes Take 1 capsule (50,000 Units total) by mouth once a week. [place] Adelita PENNER, MD  Active             Recommendation:   Continue Current Plan of Care  Follow Up Plan:   Telephone follow-up in 1 month  Rolin Kerns, LCSW [facility]  [facility], [facility] Clinical Social Worker Direct Dial: [phone]  Fax: [phone] Website: [email or web address] 4:08 PM

## 2024-06-11 ENCOUNTER — Telehealth: Payer: Self-pay | Admitting: *Deleted

## 2024-06-11 NOTE — Progress Notes (Signed)
 Complex Care Management Note Care Guide Note  06/11/2024 Name: Chloe Pena MRN: [id number] DOB: 1996-04-15  Chloe Pena is a 28 y.o. year old female who is a primary care patient of Newlin, Enobong, MD . The community resource team was consulted for assistance with Transportation Needs  and Food Insecurity  SDOH screenings and interventions completed:  Yes     SDOH Interventions Today    Flowsheet Row Most Recent Value  SDOH Interventions   Food Insecurity Interventions Community Resources Provided  [Provided food bank information]  Housing Interventions Community Resources Provided  Transportation Interventions Community Resources Provided  [employer] mail access [place] application and food stamp applcation]     Care guide performed the following interventions: Follow up call placed to community resources to determine status of patients referral.  Follow Up Plan:  No further follow up planned at this time. The patient has been provided with needed resources.  Encounter Outcome:  Patient Visit Completed  Chloe Pena  [facility] Health[facility] Care Guide  Direct Dial:[phone] Fax:[phone] Website: [email or web address]

## 2024-06-30 ENCOUNTER — Telehealth: Admitting: Licensed Clinical Social Worker

## 2024-07-14 ENCOUNTER — Other Ambulatory Visit: Payer: Self-pay | Admitting: Licensed Clinical Social Worker

## 2024-07-20 NOTE — Patient Instructions (Signed)
 Visit Information  Thank you for taking time to visit with me today. Please don't hesitate to contact me if I can be of assistance to you before our next scheduled appointment.  Patient has met all care management goals. Care Management case will be closed. Patient has been provided contact information should new needs arise.   Please call the care guide team at [phone] if you need to cancel, schedule, or reschedule an appointment.   Please call the Suicide and Crisis Lifeline: 988 go to [facility] Urgent [facility] [address] [place] [phone]) call 911 if you are experiencing a Mental Health or Behavioral Health Crisis or need someone to talk to.  Rolin Kerns, LCSW [place]  [facility], [facility] Clinical Social Worker Direct Dial: [phone]  Fax: [phone] Website: [email or web address] 12:42 PM

## 2024-07-20 NOTE — Patient Outreach (Signed)
 Complex Care Management   Visit Note  07/14/2024  Name:  Chloe Pena MRN: [id number] DOB: 1996-08-08  Situation: Referral received for Complex Care Management related to Mental/Behavioral Health diagnosis Anxiety, Depression, PTSD I obtained verbal consent from Patient.  Visit completed with Patient  on the phone  Background:   Past Medical History:  Diagnosis Date   Anxiety    no meds   Asthma    as a child, no inhaler, no problems as adult   Depression    Fatty liver    Migraine    otc med prn   PCOS (polycystic ovarian syndrome)     Assessment: Patient Reported Symptoms:  Cognitive Cognitive Status: No symptoms reported, Alert and oriented to person, place, and time, Normal speech and language skills Cognitive/Intellectual Conditions Management [RPT]: None reported or documented in medical history or problem list   Health Maintenance Behaviors: Annual physical exam  Neurological Neurological Review of Symptoms: No symptoms reported    HEENT HEENT Symptoms Reported: No symptoms reported      Cardiovascular Cardiovascular Symptoms Reported: No symptoms reported    Respiratory Respiratory Symptoms Reported: No symptoms reported    Endocrine Endocrine Symptoms Reported: No symptoms reported    Gastrointestinal Gastrointestinal Symptoms Reported: No symptoms reported      Genitourinary Genitourinary Symptoms Reported: No symptoms reported    Integumentary Integumentary Symptoms Reported: No symptoms reported    Musculoskeletal Musculoskelatal Symptoms Reviewed: No symptoms reported        Psychosocial Psychosocial Symptoms Reported: No symptoms reported Behavioral Management Strategies: Coping strategies, Support system, Medication therapy Behavioral Health Self-Management Outcome: 4 (good) Major Change/Loss/Stressor/Fears (CP): Denies Techniques to Cope with Loss/Stress/Change: Diversional activities      07/20/2024    PHQ2-9 Depression Screening    Little interest or pleasure in doing things    Feeling down, depressed, or hopeless    PHQ-2 - Total Score    Trouble falling or staying asleep, or sleeping too much    Feeling tired or having little energy    Poor appetite or overeating     Feeling bad about yourself - or that you are a failure or have let yourself or your family down    Trouble concentrating on things, such as reading the newspaper or watching television    Moving or speaking so slowly that other people could have noticed.  Or the opposite - being so fidgety or restless that you have been moving around a lot more than usual    Thoughts that you would be better off dead, or hurting yourself in some way    PHQ2-9 Total Score    If you checked off any problems, how difficult have these problems made it for you to do your work, take care of things at home, or get along with other people    Depression Interventions/Treatment      There were no vitals filed for this visit.  Medications Reviewed Today     Reviewed by Ezzard Rolin BIRCH, LCSW (Social Worker) on 07/20/24 at 1236  Med List Status: <None>   Medication Order Taking? Sig Documenting Provider Last Dose Status Informant  buPROPion  (WELLBUTRIN  XL) 150 MG 24 hr tablet [id number]  Take 1 tablet (150 mg total) by mouth daily. [place] Adelita PENNER, MD  Active   ezetimibe  (ZETIA ) 10 MG tablet [id number]  Take 1 tablet (10 mg total) by mouth daily. [place] Adelita PENNER, MD  Active   medroxyPROGESTERone  (PROVERA ) 10 MG tablet [id number]  Take  1 tablet (10 mg total) by mouth daily. Prentiss Annabella LABOR, NP  Active   metFORMIN  (GLUCOPHAGE ) 500 MG tablet [id number]  Take 1 tablet (500 mg total) by mouth 2 (two) times daily with a meal. [place] Adelita PENNER, MD  Active   nystatin  (MYCOSTATIN ) 100000 UNIT/ML suspension [id number]  Take 5 mLs (500,000 Units total) by mouth 4 (four) times daily. Newlin, Enobong, MD  Active   Prenatal Vit-Fe Fumarate-FA (PRENATAL VITAMINS) 27-0.8 MG TABS  [id number]  Take 1 tablet by mouth daily at 6 (six) AM. [place] Adelita PENNER, MD  Active   rizatriptan  (MAXALT ) 10 MG tablet [id number]  Take 1 tablet (10 mg total) by mouth as needed for migraine. May repeat in 2 hours if needed. Max daily dose - 20mg  Newlin, Enobong, MD  Active   rosuvastatin  (CRESTOR ) 20 MG tablet [id number]  Take 1 tablet (20 mg total) by mouth daily. [place] Adelita PENNER, MD  Active   sertraline  (ZOLOFT ) 100 MG tablet [id number]  TAKE 1 TABLET (100 MG TOTAL) BY MOUTH DAILY (AM) [place] Adelita PENNER, MD  Active   topiramate  (TOPAMAX ) 50 MG tablet [id number]  TAKE 1 TABLET (25 MG TOTAL) BY MOUTH DAILY. (AM) Newlin, Enobong, MD  Active   triamcinolone  cream (KENALOG ) 0.1 % [id number]  Apply 1 application  topically 2 (two) times daily. Newlin, Enobong, MD  Active   Vitamin D , Ergocalciferol , (DRISDOL ) 1.25 MG (50000 UNIT) CAPS capsule [id number]  Take 1 capsule (50,000 Units total) by mouth once a week. [place] Adelita PENNER, MD  Active             Recommendation:   Continue Current Plan of Care  Follow Up Plan:   Patient has met all care management goals. Care Management case will be closed. Patient has been provided contact information should new needs arise.   Rolin Ezzard HUGHS [facility]  [facility], [facility] Clinical Social Worker Direct Dial: [phone]  Fax: [phone] Website: [email or web address] 12:41 PM

## 2024-09-02 ENCOUNTER — Ambulatory Visit (INDEPENDENT_AMBULATORY_CARE_PROVIDER_SITE_OTHER): Admitting: Family Medicine

## 2024-10-08 ENCOUNTER — Ambulatory Visit (INDEPENDENT_AMBULATORY_CARE_PROVIDER_SITE_OTHER): Payer: Self-pay | Admitting: Family Medicine

## 2024-11-24 ENCOUNTER — Ambulatory Visit: Admitting: Family Medicine
